# Patient Record
Sex: Male | Born: 1940 | Race: White | Hispanic: No | Marital: Married | Smoking: Former smoker
Health system: Southern US, Community
[De-identification: ages and names within clinical notes are randomized; demographics above are authoritative.]

## PROBLEM LIST (undated history)

## (undated) DIAGNOSIS — K649 Unspecified hemorrhoids: Secondary | ICD-10-CM

## (undated) DIAGNOSIS — K2270 Barrett's esophagus without dysplasia: Secondary | ICD-10-CM

## (undated) DIAGNOSIS — E785 Hyperlipidemia, unspecified: Secondary | ICD-10-CM

## (undated) DIAGNOSIS — Z85828 Personal history of other malignant neoplasm of skin: Secondary | ICD-10-CM

## (undated) DIAGNOSIS — K449 Diaphragmatic hernia without obstruction or gangrene: Secondary | ICD-10-CM

## (undated) DIAGNOSIS — F17201 Nicotine dependence, unspecified, in remission: Secondary | ICD-10-CM

## (undated) DIAGNOSIS — Z86010 Personal history of colonic polyps: Secondary | ICD-10-CM

## (undated) DIAGNOSIS — IMO0002 Reserved for concepts with insufficient information to code with codable children: Secondary | ICD-10-CM

## (undated) DIAGNOSIS — I10 Essential (primary) hypertension: Secondary | ICD-10-CM

## (undated) DIAGNOSIS — R001 Bradycardia, unspecified: Secondary | ICD-10-CM

## (undated) DIAGNOSIS — K219 Gastro-esophageal reflux disease without esophagitis: Secondary | ICD-10-CM

## (undated) DIAGNOSIS — M1990 Unspecified osteoarthritis, unspecified site: Secondary | ICD-10-CM

## (undated) DIAGNOSIS — E039 Hypothyroidism, unspecified: Secondary | ICD-10-CM

## (undated) DIAGNOSIS — N400 Enlarged prostate without lower urinary tract symptoms: Secondary | ICD-10-CM

## (undated) DIAGNOSIS — Z860101 Personal history of adenomatous and serrated colon polyps: Secondary | ICD-10-CM

## (undated) HISTORY — DX: Bradycardia, unspecified: R00.1

## (undated) HISTORY — DX: Nicotine dependence, unspecified, in remission: F17.201

## (undated) HISTORY — PX: ABDOMINAL HERNIA REPAIR: SHX539

## (undated) HISTORY — DX: Diaphragmatic hernia without obstruction or gangrene: K44.9

## (undated) HISTORY — DX: Barrett's esophagus without dysplasia: K22.70

## (undated) HISTORY — DX: Personal history of adenomatous and serrated colon polyps: Z86.0101

## (undated) HISTORY — DX: Hyperlipidemia, unspecified: E78.5

## (undated) HISTORY — DX: Personal history of colonic polyps: Z86.010

## (undated) HISTORY — DX: Gastro-esophageal reflux disease without esophagitis: K21.9

## (undated) HISTORY — DX: Essential (primary) hypertension: I10

## (undated) HISTORY — PX: HERNIA REPAIR: SHX51

## (undated) HISTORY — PX: CATARACT EXTRACTION W/ INTRAOCULAR LENS  IMPLANT, BILATERAL: SHX1307

## (undated) HISTORY — PX: SQUAMOUS CELL CARCINOMA EXCISION: SHX2433

## (undated) HISTORY — DX: Hypothyroidism, unspecified: E03.9

---

## 1967-07-07 HISTORY — PX: THYROIDECTOMY, PARTIAL: SHX18

## 1989-03-06 HISTORY — PX: ABDOMINAL HERNIA REPAIR: SHX539

## 1998-07-06 HISTORY — PX: UMBILICAL HERNIA REPAIR: SHX196

## 2002-07-06 HISTORY — PX: COLONOSCOPY: SHX174

## 2003-04-24 ENCOUNTER — Ambulatory Visit (HOSPITAL_COMMUNITY): Admission: RE | Admit: 2003-04-24 | Discharge: 2003-04-24 | Payer: Self-pay | Admitting: Internal Medicine

## 2005-12-04 HISTORY — PX: COLONOSCOPY: SHX174

## 2005-12-22 ENCOUNTER — Ambulatory Visit (HOSPITAL_COMMUNITY): Admission: RE | Admit: 2005-12-22 | Discharge: 2005-12-22 | Payer: Self-pay | Admitting: Internal Medicine

## 2005-12-22 ENCOUNTER — Ambulatory Visit: Payer: Self-pay | Admitting: Internal Medicine

## 2006-01-03 HISTORY — PX: ESOPHAGOGASTRODUODENOSCOPY: SHX1529

## 2006-01-19 ENCOUNTER — Ambulatory Visit: Payer: Self-pay | Admitting: Internal Medicine

## 2006-02-01 ENCOUNTER — Ambulatory Visit: Payer: Self-pay | Admitting: Internal Medicine

## 2006-02-01 ENCOUNTER — Encounter (INDEPENDENT_AMBULATORY_CARE_PROVIDER_SITE_OTHER): Payer: Self-pay | Admitting: *Deleted

## 2006-02-01 ENCOUNTER — Ambulatory Visit (HOSPITAL_COMMUNITY): Admission: RE | Admit: 2006-02-01 | Discharge: 2006-02-01 | Payer: Self-pay | Admitting: Internal Medicine

## 2006-05-03 ENCOUNTER — Ambulatory Visit: Payer: Self-pay | Admitting: Internal Medicine

## 2006-09-09 ENCOUNTER — Ambulatory Visit (HOSPITAL_COMMUNITY): Admission: RE | Admit: 2006-09-09 | Discharge: 2006-09-09 | Payer: Self-pay | Admitting: Pulmonary Disease

## 2007-04-25 ENCOUNTER — Ambulatory Visit: Payer: Self-pay | Admitting: Internal Medicine

## 2007-07-07 DIAGNOSIS — R001 Bradycardia, unspecified: Secondary | ICD-10-CM

## 2007-07-07 HISTORY — DX: Bradycardia, unspecified: R00.1

## 2007-09-29 ENCOUNTER — Ambulatory Visit (HOSPITAL_COMMUNITY): Admission: RE | Admit: 2007-09-29 | Discharge: 2007-09-29 | Payer: Self-pay | Admitting: Pulmonary Disease

## 2008-04-04 ENCOUNTER — Ambulatory Visit: Payer: Self-pay | Admitting: Internal Medicine

## 2008-05-08 ENCOUNTER — Ambulatory Visit: Payer: Self-pay | Admitting: Cardiology

## 2009-04-11 DIAGNOSIS — R634 Abnormal weight loss: Secondary | ICD-10-CM | POA: Insufficient documentation

## 2009-04-11 DIAGNOSIS — C4490 Unspecified malignant neoplasm of skin, unspecified: Secondary | ICD-10-CM | POA: Insufficient documentation

## 2009-04-11 DIAGNOSIS — K219 Gastro-esophageal reflux disease without esophagitis: Secondary | ICD-10-CM | POA: Insufficient documentation

## 2009-04-11 DIAGNOSIS — K319 Disease of stomach and duodenum, unspecified: Secondary | ICD-10-CM | POA: Insufficient documentation

## 2009-04-11 DIAGNOSIS — R1319 Other dysphagia: Secondary | ICD-10-CM | POA: Insufficient documentation

## 2009-04-12 ENCOUNTER — Ambulatory Visit: Payer: Self-pay | Admitting: Internal Medicine

## 2009-04-12 DIAGNOSIS — D126 Benign neoplasm of colon, unspecified: Secondary | ICD-10-CM | POA: Insufficient documentation

## 2010-08-05 NOTE — Assessment & Plan Note (Signed)
Summary: FU OV 1 YR,GERD,DYSPHAGIA/AMS   Visit Type:  Follow-up Visit Primary Care Provider:  Dr. Juanetta Gosling  Chief Complaint:  1 year follow up-doing ok.  History of Present Illness:      This is a 70 year old male who presents with GERD / heartburn follow-up.  The patient denies fever, anorexia, weight loss, abdominal pain, dysphagia, odynophagia, early satiety, hemetemesis, rectal bleeding, melena, and nocturnal symptoms.  Off Zegerid for 6 months, started having dysphagia with solids, resumed zegerid about 3 weeks ago, feels fine now.  Denies any heartburn or indigestion.  Current Medications (verified): 1)  Synthroid 150 Mcg Tabs (Levothyroxine Sodium) .... Once Daily 2)  Lisinopril-Hydrochlorothiazide 20-12.5 Mg Tabs (Lisinopril-Hydrochlorothiazide) .... Two Times A Day 3)  Amlodipine Besylate 10 Mg Tabs (Amlodipine Besylate) .... Once Daily 4)  Zegerid 40-1100 Mg Caps (Omeprazole-Sodium Bicarbonate) .... Once Daily  Allergies: No Known Drug Allergies  Review of Systems General:  Denies fever, chills, sweats, anorexia, fatigue, weakness, malaise, weight loss, and sleep disorder. CV:  Denies chest pains, angina, palpitations, syncope, dyspnea on exertion, orthopnea, PND, peripheral edema, and claudication. Resp:  Denies dyspnea at rest, dyspnea with exercise, cough, sputum, wheezing, coughing up blood, and pleurisy. GI:  Denies nausea, indigestion/heartburn, jaundice, gas/bloating, diarrhea, constipation, change in bowel habits, bloody BM's, black BMs, and fecal incontinence. Derm:  Denies rash, itching, dry skin, hives, moles, warts, and unhealing ulcers.  Vital Signs:  Patient profile:   70 year old male Height:      69 inches Weight:      240 pounds BMI:     35.57 Temp:     97.2 degrees F oral Pulse rate:   60 / minute BP sitting:   148 / 90  (left arm) Cuff size:   large  Vitals Entered By: Hendricks Limes LPN (April 12, 2009 9:09 AM)  Physical Exam  General:  obese.    Head:  Normocephalic and atraumatic. Eyes:  Sclera clear, no icterus. Mouth:  No deformity or lesions, dentition normal. Heart:  Regular rate and rhythm; no murmurs, rubs,  or bruits. Abdomen:  normal bowel sounds, obese, without guarding, without rebound, no hernia, no distesion, no tenderness, no masses, and no hepatomegally or splenomegaly.   Msk:  Symmetrical with no gross deformities. Normal posture. Pulses:  Normal pulses noted. Extremities:  trace pedal edema.   Neurologic:  Alert and  oriented x4;  grossly normal neurologically. Skin:  redness.   Cervical Nodes:  No significant cervical adenopathy. Psych:  Alert and cooperative. Normal mood and affect.  Impression & Recommendations:  Problem # 1:  GASTROESOPHAGEAL REFLUX DISEASE, CHRONIC (ICD-530.81) 70 y/o caucasian male with hx complicated GERD/peptic stricture and erosive reflux esophagitis on last EGD 2007.  Doing well on PPI.  Problem # 2:  Hx COLONIC POLYPS, ADENOMATOUS (ICD-211.3) Assessment: Comment Only  Other Orders: Est. Patient Level III ([address])  Patient Instructions: 1)  Call if difficulty swallowing returns 2)  Stay on your ZEGERID every day 3)  Office visit 12/2010 to setup colonoscopy/FU GERD 4)  FU your MD about your fluid in your legs 5)  The medication list was reviewed and reconciled.  All changed / newly prescribed medications were explained.  A complete medication list was provided to the patient / caregiver. Prescriptions: ZEGERID 40-1100 MG CAPS (OMEPRAZOLE-SODIUM BICARBONATE) once daily  #30 x 11   Entered and Authorized by:   Joselyn Arrow FNP-BC   Signed by:   Joselyn Arrow FNP-BC on 04/12/2009  Method used:   Electronically to        [facility] [address] (retail)       [address]       [place], [place]  [address]       Ph: [phone]       Fax: [phone]   RxID:   [id number]

## 2010-11-18 NOTE — Assessment & Plan Note (Signed)
NAMEMILFORD, CILENTO                 CHART#:  [id number]   DATE:  04/25/2007                       DOB:  08-22-1940   CHIEF COMPLAINT:  One-year followup GERD.   SUBJECTIVE:  Mr. Every is a 70 year old Caucasian male.  He has history  of gastroesophageal reflux disease and erosive reflux esophagitis and  peptic stricture.  He was dilated when he underwent EGD by Dr. Jena Gauss on  02/01/06.  His reflux symptoms had been well controlled on Aciphex 20 mg  daily.  However, about a month ago, he ran out.  He tells me he was  paying an extraordinary amount of money for his Aciphex as it was not  covered by his insurance.  He has been off of PPI for about a month.  He  did take a couple of his wife's Zegerid which seemed to help  significantly.  He denies any dysphagia or odynophagia.  Denies any  anorexia or early satiety.   CURRENT MEDICATIONS:  See the list from 04/25/07.   ALLERGIES:  NO KNOWN DRUG ALLERGIES.   OBJECTIVE:  VITAL SIGNS:  Weight 223 pounds.  Height 70 inches.  Temp  97.9.  Blood pressure 140/88.  Pulse 64.  GENERAL:  Mr. Franca is a well-developed, well-nourished, Caucasian male  in no acute distress.  HEENT:  Sclerae clear.  Nonicteric conjunctivae.  Oropharynx pink and  moist without any lesions.  CHEST:  Heart regular rate and rhythm.  Normal S1 and S2.  ABDOMEN:  Protuberant with positive bowel sounds x4.  No bruits  auscultated.  Soft, nontender, and nondistended without palpable mass,  hepatosplenomegaly.  No rebound tenderness or guarding.  EXTREMITIES:  Without clubbing or edema bilaterally.   ASSESSMENT:  Chronic GERD/erosive reflux esophagitis and peptic  stricture.  Patient doing well on PPI.  Unfortunately, he has been out  for 1 month.   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   PLAN:  1. Begin omeprazole 20 mg b.i.d., #60, with 0 refills.  If he does not      have  adequate symptom control, he is going to call me and we will      change him back to either Aciphex or Zegerid.  2. He was given Prilosec 20 mg samples to take home.  3. Colonoscopy in June of 2012.  4. Office visit in 1 year.       Lorenza Burton, N.P.  Electronically Signed     R. Roetta Sessions, M.D.  Electronically Signed    KJ/MEDQ  D:  04/25/2007  T:  04/26/2007  Job:  [id number]   cc:   Ramon Dredge L. Juanetta Gosling, M.D.

## 2010-11-18 NOTE — Letter (Signed)
May 08, 2008    Ramon Dredge L. Juanetta Gosling, MD  [address]  [place], [place] [address]   RE:  [name], [name]  MRN:  [id number]  /  DOB:  May 05, 1941   Dear Renae Fickle:   It was my pleasure evaluating, [name] in the office today in  consultation at your request for bradycardia.  As you know, this nice  gentleman is screened by our company on an annual basis with various  blood tests and ultrasound studies.  For the past 2 years, bradycardia  in the 40s has been noted.  His blood pressure control has been  suboptimal when tested by this service, but good in your office.  His  lipids are moderately abnormal.  A 5-year cardiovascular risk calculated  by this service was 14%.   [name] feels fine.  He is quite active, including mowing lawns as a  business, without symptoms.  He has had no lightheadedness and  certainly, no syncope.  He has no known cardiovascular disease.  He has  never previously seen a cardiologist nor undergone any significant  cardiac testing.   PAST MEDICAL HISTORY:  Notable for GERD.  He has had hypothyroidism and  is maintained on replacement therapy.  This occurred after resection of  the right lobe of his thyroid in the past.  He has also undergone repair  of an abdominal hernia.   ALLERGIES:  He has no known allergies.   CURRENT MEDICATIONS:  Include only:  1. Levothyroxine 0.15 mg daily.  2. Amlodipine 10 mg daily.  3. Lisinopril/hydrochlorothiazide 20/12.5 mg daily.   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  Married, with 2 adult children.   FAMILY HISTORY:  Positive for carcinoma of the lung in his father.  He  has had 3 siblings, one of whom died due to trauma.   REVIEW OF SYSTEMS:  Notable for need for corrective lenses, previous  cataract extraction from the left eye, occasional palpitations, stable  weight, and appetite on a regular  diet.  All other systems reviewed and  are negative.   PHYSICAL EXAMINATION:  GENERAL:  A healthy-appearing gentleman, in no  acute distress.  VITAL SIGNS:  The weight is 226, blood pressure 145/90, heart rate 62  and regular, and respirations 14.  HEENT:  Anicteric sclerae; bilateral arcus; EOMs full; and normal oral  mucosa.  SKIN:  Multiple areas of vitiligo; erythema over the posterior aspects  of both hands and in other sun-exposed areas; orange discoloration of  the palms - the patient denies significant consumption of carrots.  ENDOCRINE:  Enlargement of the left lobe of thyroid.  LUNGS:  Clear.  CARDIAC:  Normal first and second heart sounds; minimal systolic  ejection murmur; and normal PMI.  ABDOMEN:  Soft and nontender; no organomegaly; normal bowel sounds  without bruits.  EXTREMITIES:  Normal distal pulses; no edema.  NEUROLOGIC:  Symmetric strength and tone; normal cranial nerves.   EKG:  Normal sinus rhythm; slightly delayed R-wave progression;  borderline left atrial abnormality; otherwise normal.   LABORATORY DATA:  Laboratory from your office and the results of the  patient's screening tests were reviewed.  Mild atherosclerotic changes  were identified in his carotids without any focal stenosis.  He had  normal ABIs.  He had an LDL of 159, which was close at 130 in your  office.  He had systolic  blood pressure of 174, which has been lower in  your office and mine.  His chemistry profile and hepatic profile were  normal.  TSH was normal as was PSA.   IMPRESSION:  [name] is doing fairly well overall.  He has  asymptomatic sinus bradycardia, that has been present for at least the  past year and probably much longer.  No intervention is warranted at  this time.  If it becomes symptomatic, he may require implantation of a  pacemaker, but this is relatively and unlikely and certainly  unpredictable.  He should avoid medications, which might lower heart  rate,  particularly beta-blockers.   Blood pressure control is slightly suboptimal.  You may wish to increase  his dose of lisinopril/hydrochlorothiazide to determine if that has a  salutary effect.   His risk of coronary artery disease is intermediate.  By guidelines, he  is not absolutely a candidate for a statin drug, but I would recommend  treatment with one of the more inexpensive agent such as simvastatin.  I  will leave that decision to you and him.  Influenza vaccine was  administered at his request.  Please call me at anytime that I can  assist in the care of this nice gentleman.    Sincerely,      Gerrit Friends. Dietrich Pates, MD, [facility]  Electronically Signed    RMR/MedQ  DD: 05/09/2008  DT: 05/10/2008  Job #: [id number]

## 2010-11-18 NOTE — Assessment & Plan Note (Signed)
NAME:  [name], [name]                 CHART#:  [id number]   DATE:  04/04/2008                       DOB:  1940-07-11   FOLLOWUP:  Last seen on 04/25/2007, history of erosive reflux  esophagitis with stricture requiring dilation by me in 2007.  He was  doing very well on Aciphex 20 mg orally daily.  Insurance dictated he go  to omeprazole, which he has been taken just 20 mg twice daily.  He has  frequent breakthrough symptom of occasional dysphagia, but I do not feel  like his dysphagia is bad enough to warrant repeat esophageal dilation.  He has not had any other intercurrent medical illnesses.  He is followed  primarily by Dr. Juanetta Gosling.  He has gained a good 11 or 12 pounds since he  was last seen here a year ago.   CURRENT MEDICATIONS:  See updated list.   ALLERGIES:  No known drug allergies.   PHYSICAL EXAMINATION:  GENERAL:  Today, he appears well.  VITAL SIGNS:  Weight 234, height 5 feet 9 inches, temperature 97.5, BP  120/90, and pulse 56.  SKIN:  Warm and dry.  CHEST:  Lungs clear to auscultation.  CARDIAC:  Regular rate and rhythm without murmur, gallop, or rub.  ABDOMEN:  Nondistended, positive bowel sounds, soft, and nontender  without appreciable mass or organomegaly.   ASSESSMENT:  History of erosive reflux esophagitis, peptic stricture,  status post dilation previously.  He is having breakthrough symptoms on  omeprazole.  He is having intermittent esophageal dysphagia.   RECOMMENDATIONS:  1. Weight loss, antireflux lifestyle.  2. Samples of Aciphex 20 mg orally once daily x1 month given.      Prescription for Aciphex 20 mg once daily given.  His insurance      company may protest, but omeprazole does not work on this gentleman      and he would stay on this agent.  He also strive for 10-20 pound      weight loss in the next year.  We will see him back in 1 year.      Should he have any worsening of his symptoms, I told just to give      [place] a call.  We will set him up  for an EGD with esophageal dilation.      Not mentioned above, he has a history of tubulovillous adenoma      removed from      his rectum previously with a negative surveillance colonoscopy in      June 2007.  He is due for surveillance exam in 2012.       [name], M.D.  Electronically Signed     RMR/MEDQ  D:  04/04/2008  T:  04/04/2008  Job:  [id number]   cc:   Ramon Dredge L. Juanetta Gosling, M.D.

## 2010-11-21 NOTE — Op Note (Signed)
NAME:  [name], [name]                ACCOUNT NO.:  [id number]   MEDICAL RECORD NO.:  [id number]          PATIENT TYPE:  AMB   LOCATION:  DAY                           FACILITY:  [facility]   PHYSICIAN:  R. Roetta Sessions, M.D. DATE OF BIRTH:  1941/05/24   DATE OF PROCEDURE:  02/01/2006  DATE OF DISCHARGE:                                 OPERATIVE REPORT   PROCEDURE:  Esophagogastroduodenoscopy with Elease Hashimoto dilation, followed by  biopsy.   INDICATIONS FOR PROCEDURE:  The patient is a 70 year old gentleman with  longstanding gastroesophageal reflux disease.  He has some symptoms  manifesting as heartburn.  He has not been on acid suppression therapy  recently.  He has esophageal dysphagia.  EGD is now being done.  This  procedure has been discussed with the patient at length.  Potential risks,  benefits, and alternatives, have been reviewed, questions answered.  He is  agreeable.  Please see documentation in the medical record.   PROCEDURE NOTE:  O2 saturation, blood pressure, pulse and respirations were  monitored throughout the entire procedure.  Conscious sedation with Versed 4  mg IV, Demerol 75 mg IV in divided doses.   INSTRUMENT:  Olympus video chip system.   FINDINGS:  Examination of tubular esophagus revealed geographic erosions,  extending up one-third of the length to the distal esophagus.  There was no  Barrett's esophagus.  There are multiple 1, 2 to 3 mm pale lesions  throughout the esophagus, consistent with squamous papillomas.  There was a  soft, benign-appearing peptic stricture.  It appeared to be noncritical.  The EG junction was easily traversed with the scope and into the stomach.  The gastric cavity was empty.  It insufflated well with air.  Thorough  examination of the gastric mucosa and retroflexed at the proximal stomach,  esophagus, gastrics demonstrated only a small hiatal hernia and multiple  linear antral erosions.  No ulcer or infiltrative process was seen.   Pylorus  was patent and easily traversed.  Examination of the bulb and second portion  revealed no abnormalities.   THERAPEUTIC/DIAGNOSTIC MANEUVERS PERFORMED:  A #56 [place] Maloney dilator  was passed following insertion and would easily move back and  revealed no  apparent complications.  __________ was dilated.  One of the pale, raised  lesions was biopsied for histologic study.  The patient tolerated the  procedure well and was reactive.   ENDOSCOPY IMPRESSION:  Marked inflammatory changes, distal one-third of  esophageal mucosa, consistent with geographic erosive reflux esophagitis.  Soft, noncritical peptic stricture, status post dilation as described above.  We biopsied one of the pale (in all likelihood benign) lesions in the  esophagus.  Small hiatal hernia.  Multiple antral erosions.  Otherwise  normal gastric mucosa, patent pylorus, normal __________.   RECOMMENDATIONS:  1.  Begin AcipHex 20 mg orally twice daily.  He is to go by my office for      free samples.  We will go with twice daily therapy for a month and try      back to once daily thereafter.  2.  GERD  literature was provided to Mr. Teas.  3.  Follow up on pathology.  4.  Followup appointment with [place] in the office in three months.      [name], M.D.  Electronically Signed     RMR/MEDQ  D:  02/01/2006  T:  02/01/2006  Job:  [id number]   cc:   Ramon Dredge L. Juanetta Gosling, M.D.  Fax: [phone]

## 2010-11-21 NOTE — Op Note (Signed)
NAME:  [name], [name]                          ACCOUNT NO.:  [id number]   MEDICAL RECORD NO.:  [id number]                   PATIENT TYPE:  AMB   LOCATION:  DAY                                  FACILITY:  [facility]   PHYSICIAN:  R. Roetta Sessions, M.D.              DATE OF BIRTH:  1941-03-02   DATE OF PROCEDURE:  04/24/2003  DATE OF DISCHARGE:                                 OPERATIVE REPORT   PROCEDURE:  Colonoscopy with snare polypectomy.   INDICATIONS FOR PROCEDURE:  The patient is a 70 year old gentleman who comes  for colorectal cancer screening.  He was referred by Dr. Juanetta Gosling.  He is  devoid of any lower GI tract symptoms.  Back in the 1990s, he had a  colonoscopy in [place] which was reportedly negative.  There is no family  history of colorectal neoplasia.  Colonoscopy is now being done as a  screening maneuver.  This approach has been discussed with the patient.  The  potential risks, benefits, and alternatives have been reviewed and questions  answered.  He is agreeable.  Please see my handwritten H&P.   PROCEDURE:  O2 saturation, blood pressure, pulses, and respirations were  monitored throughout the entire procedure.  Conscious sedation was with  Versed 2 mg IV, Demerol 50 mg IV in divided doses.  The instrument used was  the Olympus video chip adult colonoscope.   FINDINGS:  Digital rectal examination revealed no abnormalities.   ENDOSCOPIC FINDINGS:  The prep was good.   Rectum:  Examination of the rectal mucosa including retroflex view of the  anal verge revealed a 1.25-cm sessile polyp straddling a fold 10 cm in from  the anal verge.  Please see photos.  The remainder of the rectal mucosa  appeared normal.   Colon:  The colonic mucosa was surveyed from the rectosigmoid junction  through the left, transverse, right colon to the area of the appendiceal  orifice, ileocecal valve, and cecum.  These structures were well-seen and  photographed for the record.  The  colonic mucosa all the way to the cecum  appeared normal.  From the level of the cecum and ileocecal valve, the scope  was slowly withdrawn.  All previously mentioned mucosal surfaces were again  seen, and no other abnormalities were observed.  The polyp at 10 cm was  removed completely in piecemeal fashion.  All fragments were recovered.  The  patient tolerated the procedure very well and was reactive in endoscopy.   IMPRESSION:  Sessile rectal polyp, as described above; removed, as described  above.  Otherwise, normal rectum, normal colon.    RECOMMENDATIONS:  1. No aspirin or arthritis medications for 10 days.  2. Follow up on pathology.  3. Further recommendations to follow.      ___________________________________________  [name], M.D.   RMR/MEDQ  D:  04/24/2003  T:  04/24/2003  Job:  [id number]   cc:   Ramon Dredge L. Juanetta Gosling, M.D.  [address]  [place]  [place] [address]  Fax: [phone]

## 2010-11-21 NOTE — H&P (Signed)
NAMECHRISTION, [name]                ACCOUNT NO.:  [id number]   MEDICAL RECORD NO.:  [id number]           PATIENT TYPE:   LOCATION:                                FACILITY:  [facility]   PHYSICIAN:  R. Roetta Sessions, M.D. DATE OF BIRTH:  1940/08/07   DATE OF ADMISSION:  DATE OF DISCHARGE:  LH                                HISTORY & PHYSICAL   REASON:  EGD.   HPI:  [name] is a 70 year old Caucasian male who presents today for  evaluation of chronic GERD symptoms.  He states he has almost daily  heartburn that is generally worse with certain foods, especially fried  foods.  He has a history of this for at least 3 years that he can recall.  He has also more recently noted solid food dysphagia.  He feels like his  food gets stuck and points to his mid esophagus.  He can clear the food  with a bolus of water.  He denies any problems with liquids.  He denies any  odynophagia.  He had been on Prevacid 30 mg daily for several years and more  recently his insurance company said they were not going to pay for this so  he has been taking p.r.n. over-the-counter Tums.  He denies any nausea,  vomiting, anorexia or early satiety.  He denies any rectal bleeding or  melena.   CURRENT MEDICATIONS:  1.  Diovan 320 mg daily.  2.  DynaCirc CK 10 mg daily.  3.  Synthroid 175 mcg daily.  4.  Tums p.r.n.  5.  Motrin 200 mg rarely.   ALLERGIES:  NO KNOWN DRUG ALLERGIES.   PAST MEDICAL HISTORY:  Significant for:  1.  Squamous cell skin carcinoma.  2.  Neuropathy in 1994.  3.  Nodule removed from his thyroid in 1969, he has been on Synthroid since.  4.  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  He had a follow up colonoscopy, by      Dr. Jena Gauss December 22, 2005, which was normal.   FAMILY HISTORY:  Positive for a brother with colonic polyps.  Mother alive  and healthy at age 58, she has hypertension.  Father deceased at age 7 with  history  of lung carcinoma, he was a smoker.  He has two healthy siblings,  one deceased secondary to MVA.   SOCIAL HISTORY:  [name] has been in his second marriage for the last 12  years.  He has two grown healthy children.  He is a retired Naval architect.  He does landscaping on the side.  He has a 20 pack year history of tobacco  use, quitting in 1981.  He drinks a couple of beers a week.  Denies any drug  use.   REVIEW OF SYSTEMS:  CONSTITUTIONAL:  See HPI.  Denies any fever or chills.  Denies any fatigue.  CARDIOVASCULAR:  Denies any chest pain or palpitations.  PULMONARY:  Denies any shortness of breath, dyspnea, cough, hemoptysis.  GI:  See HPI.  Denies any diarrhea, constipation.   PHYSICAL EXAM:  VITAL SIGNS:  Is 223.5 pounds, height 69 inches, temperature  97.6, blood pressure 138/90 and pulse 56.  GENERAL:  [name] is a 70 year old Caucasian male who is alert, oriented,  pleasant, cooperative, in no acute distress.  He is well-developed, well-  nourished.  HEENT:  Sclera clear, nonicteric, conjunctiva pink.  Oropharynx pink and  moist without any lesions.  NECK:  Supple without any mass or thyromegaly.  CHEST:  Heart regular rate and rhythm with normal S1, S2, without murmurs,  clicks, rubs or gallops.  LUNGS:  Clear to auscultation bilaterally.  ABDOMEN:  Protuberant with positive bowel sounds x4.  No bruits auscultated.  Soft, nontender, nondistended without palpable mass or hepatosplenomegaly.  No tenderness or guarding.  EXTREMITIES:  With 1+ lower extremity edema bilaterally.   IMPRESSION:  [name] is a 70 year old Caucasian male with a history of  chronic gastroesophageal reflux disease, mostly daily heartburn.  He has  been off proton pump inhibitor due to insurance reasons.  He is also  complaining of solid food dysphagia.  He is going to need further evaluation  with esophagogastroduodenoscopy and possible esophageal dilatation if he has  developed esophageal  web, ring or stricture and to rule out Barrett's  esophagus.   PLAN:  1.  Rx is given for omeprazole 20 mg daily, #30 with 5 refills.  2.  GERD literature is given for his review.  3.  He is scheduled for EGD with ED with Dr. Jena Gauss in the near future.  I      have discussed this procedure, including risks and benefits, which      include, but are not limited to, bleeding, infection, perforation, drug      reaction.  He agrees with plan and consent will be obtained.      Nicholas Lose, N.P.      [name], M.D.  Electronically Signed    KC/MEDQ  D:  01/19/2006  T:  01/19/2006  Job:  [id number]   cc:   Ramon Dredge L. Juanetta Gosling, M.D.  Fax: [phone]

## 2010-11-21 NOTE — Op Note (Signed)
NAME:  [name], [name]                ACCOUNT NO.:  [id number]   MEDICAL RECORD NO.:  [id number]          PATIENT TYPE:  AMB   LOCATION:  DAY                           FACILITY:  [facility]   PHYSICIAN:  R. Roetta Sessions, M.D. DATE OF BIRTH:  1941/04/23   DATE OF PROCEDURE:  12/22/2005  DATE OF DISCHARGE:                                 OPERATIVE REPORT   PROCEDURE:  Surveillance colonoscopy.   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  It was removed by me.  He has  not really had any GI symptoms, although he thinks he might have passed a  small amount of blood per rectum 1 month ago.  He is here for surveillance.  This approach has been discussed with the patient at length, the potential  risks, benefits, and alternatives have been reviewed, questions answered.  He is agreeable.  Please see the documentation in the medical record.   PROCEDURE NOTE:  O2 saturation, blood pressure, pulse, and respiration were  monitored throughout the entire procedure.   CONSCIOUS SEDATION:  Versed 3 mg IV, Demerol 75 mg IV in divided doses.   INSTRUMENT USED:  Olympus video chip system.   FINDINGS:  Digital rectal exam revealed no abnormalities.  Endoscopic  findings:  Prep was adequate.   Rectum:  Examination of the rectal mucosa including a retroflexed view of  the anal verge revealed no abnormalities.   Colon:  The colonic mucosa was surveyed from the rectosigmoid junction  through the left, transverse and right colon to the area of the appendiceal  orifice, ileocecal valve and cecum.  These structures were well-seen and  photographed for the record.  From this level the scope was slowly and  cautiously withdrawn.  All previously-mentioned mucosal surfaces were again  seen.  The colonic mucosa appeared normal.  The patient tolerated the  procedure well, was reacted in endoscopy.   IMPRESSION:  1.  Normal rectum.  2.   Normal colon.   RECOMMENDATIONS:  Repeat colonoscopy 5 years.      [name], M.D.  Electronically Signed     RMR/MEDQ  D:  12/22/2005  T:  12/22/2005  Job:  [id number]   cc:   Ramon Dredge L. Juanetta Gosling, M.D.  Fax: [phone]

## 2010-12-17 ENCOUNTER — Encounter: Payer: Self-pay | Admitting: Gastroenterology

## 2010-12-17 ENCOUNTER — Ambulatory Visit (INDEPENDENT_AMBULATORY_CARE_PROVIDER_SITE_OTHER): Payer: Medicare Other | Admitting: Gastroenterology

## 2010-12-17 DIAGNOSIS — R1314 Dysphagia, pharyngoesophageal phase: Secondary | ICD-10-CM

## 2010-12-17 DIAGNOSIS — K219 Gastro-esophageal reflux disease without esophagitis: Secondary | ICD-10-CM

## 2010-12-17 DIAGNOSIS — R1319 Other dysphagia: Secondary | ICD-10-CM | POA: Insufficient documentation

## 2010-12-17 DIAGNOSIS — D126 Benign neoplasm of colon, unspecified: Secondary | ICD-10-CM

## 2010-12-17 DIAGNOSIS — R1310 Dysphagia, unspecified: Secondary | ICD-10-CM

## 2010-12-17 NOTE — Assessment & Plan Note (Signed)
Due for surveillance colonoscopy given history of tubulovillous adenoma in the past. I have discussed the risks, alternatives, benefits with regards to but not limited to the risk of reaction to medication, bleeding, infection, perforation and the patient is agreeable to proceed. Written consent to be obtained.

## 2010-12-17 NOTE — Progress Notes (Signed)
Primary Care Physician:  Fredirick Maudlin, MD  Primary Gastroenterologist:  Roetta Sessions, MD  Chief Complaint  Patient presents with  . Dysphagia    HPI:  [name] is a 70 y.o. male here for further evaluation of dysphagia to solid foods. He is also due for surveillance colonoscopy given history of tubulovillous adenoma of the rectum previously. Solid food dysphagia over the past few months. No heartburn on Prilosec. BM regular, no melena, no brbpr. No abdominal pain. No weight loss.    Current Outpatient Prescriptions  Medication Sig Dispense Refill  . allopurinol (ZYLOPRIM) 300 MG tablet Take 300 mg by mouth daily.        Marland Kitchen amLODipine (NORVASC) 10 MG tablet Take 10 mg by mouth daily.        Marland Kitchen atorvastatin (LIPITOR) 20 MG tablet Take 20 mg by mouth daily.        Marland Kitchen levothyroxine (SYNTHROID, LEVOTHROID) 150 MCG tablet Take 125 mcg by mouth daily.       Marland Kitchen lisinopril-hydrochlorothiazide (PRINZIDE,ZESTORETIC) 20-12.5 MG per tablet Take 1 tablet by mouth daily.        Marland Kitchen omeprazole (PRILOSEC OTC) 20 MG tablet Take 20 mg by mouth daily.        . pravastatin (PRAVACHOL) 40 MG tablet Take 40 mg by mouth daily.        Marland Kitchen omeprazole-sodium bicarbonate (ZEGERID) 40-1100 MG per capsule Take 1 capsule by mouth daily before breakfast.          Allergies as of 12/17/2010  . (No Known Allergies)    Past Medical History  Diagnosis Date  . Neuropathy 1994  . HTN (hypertension)   . Hyperlipidemia   . Gout   . Hypothyroidism   . GERD (gastroesophageal reflux disease)   . Hx of adenomatous colonic polyps     Past Surgical History  Procedure Date  . Thyroid nodule 1969    removed  . Hernia repair   . Esophagogastroduodenoscopy 01/2006    geographic erosive RE, noncritical peptic stricture s/p dilation, antral erosion  . Colonoscopy 12/2005    normal  . Colonoscopy 2004    tubulovillous adenoma of rectum     Family History  Problem Relation Age of Onset  . Colon cancer Neg Hx   .  Liver disease Neg Hx   . Lung cancer Father 24  . Colon polyps Brother     History   Social History  . Marital Status: Married    Spouse Name: N/A    Number of Children: N/A  . Years of Education: N/A   Occupational History  . landscaping    Social History Main Topics  . Smoking status: Former Smoker    Types: Cigarettes  . Smokeless tobacco: Former Neurosurgeon    Quit date: 07/19/1979  . Alcohol Use: Yes     a beer every now again  . Drug Use: No  . Sexually Active: Not on file   Other Topics Concern  . Not on file   Social History Narrative  . No narrative on file      ROS:  General: Negative for anorexia, weight loss, fever, chills, fatigue, weakness. Eyes: Negative for vision changes.  ENT: Negative for hoarseness, difficulty swallowing , nasal congestion. CV: Negative for chest pain, angina, palpitations, dyspnea on exertion, peripheral edema.  Respiratory: Negative for dyspnea at rest, dyspnea on exertion, cough, sputum, wheezing.  GI: See history of present illness. GU:  Negative for dysuria, hematuria, urinary incontinence, urinary frequency,  nocturnal urination.  MS: Negative for joint pain, low back pain.  Derm: Negative for rash or itching.  Neuro: Negative for weakness, abnormal sensation, seizure, frequent headaches, memory loss, confusion.  Psych: Negative for anxiety, depression, suicidal ideation, hallucinations.  Endo: Negative for unusual weight change.  Heme: Negative for bruising or bleeding. Allergy: Negative for rash or hives.    Physical Examination:  BP 158/84  Pulse 63  Temp(Src) 97.7 F (36.5 C) (Temporal)  Ht 5\' 9"  (1.753 m)  Wt 241 lb 9.6 oz (109.589 kg)  BMI 35.68 kg/m2   General: Well-nourished, well-developed in no acute distress.  Head: Normocephalic, atraumatic.   Eyes: Conjunctiva pink, no icterus. Mouth: Oropharyngeal mucosa moist and pink , no lesions erythema or exudate. Neck: Supple without thyromegaly, masses, or  lymphadenopathy.  Lungs: Clear to auscultation bilaterally.  Heart: Regular rate and rhythm, no murmurs rubs or gallops.  Abdomen: Bowel sounds are normal, nontender, nondistended, no hepatosplenomegaly or masses, no abdominal bruits or    hernia , no rebound or guarding.   Extremities: No lower extremity edema.  Neuro: Alert and oriented x 4 , grossly normal neurologically.  Skin: Warm and dry, no rash or jaundice.   Psych: Alert and cooperative, normal mood and affect.

## 2010-12-17 NOTE — Assessment & Plan Note (Signed)
Continue Prilosec OTC 20 mg daily for now. Based on EGD findings we may need to alter therapy.

## 2010-12-17 NOTE — Assessment & Plan Note (Signed)
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 Denies typical heartburn symptoms on Prilosec. EGD with dilation in the near future. I have discussed the risks, alternatives, benefits with regards to but not limited to the risk of reaction to medication, bleeding, infection, perforation and the patient is agreeable to proceed. Written consent to be obtained.

## 2010-12-18 NOTE — Progress Notes (Signed)
Cc to PCP 

## 2011-01-09 MED ORDER — SODIUM CHLORIDE 0.45 % IV SOLN
Freq: Once | INTRAVENOUS | Status: DC
  Filled 2011-01-09: qty 1000

## 2011-01-12 ENCOUNTER — Other Ambulatory Visit: Payer: Self-pay | Admitting: Internal Medicine

## 2011-01-12 ENCOUNTER — Encounter (HOSPITAL_COMMUNITY)
Admission: RE | Disposition: A | Discharge status: Home / Self Care | Payer: Self-pay | Source: Ambulatory Visit | Attending: Internal Medicine

## 2011-01-12 ENCOUNTER — Ambulatory Visit (HOSPITAL_COMMUNITY)
Admission: RE | Admit: 2011-01-12 | Discharge: 2011-01-12 | Disposition: A | Discharge status: Home / Self Care | Payer: Medicare Other | Source: Ambulatory Visit | Attending: Internal Medicine | Admitting: Internal Medicine

## 2011-01-12 ENCOUNTER — Encounter (HOSPITAL_COMMUNITY): Payer: Self-pay

## 2011-01-12 ENCOUNTER — Encounter (INDEPENDENT_AMBULATORY_CARE_PROVIDER_SITE_OTHER): Payer: Medicare Other | Admitting: Internal Medicine

## 2011-01-12 DIAGNOSIS — Z1211 Encounter for screening for malignant neoplasm of colon: Secondary | ICD-10-CM

## 2011-01-12 DIAGNOSIS — Z860100 Personal history of colon polyps, unspecified: Secondary | ICD-10-CM | POA: Insufficient documentation

## 2011-01-12 DIAGNOSIS — R634 Abnormal weight loss: Secondary | ICD-10-CM | POA: Insufficient documentation

## 2011-01-12 DIAGNOSIS — R1314 Dysphagia, pharyngoesophageal phase: Secondary | ICD-10-CM | POA: Insufficient documentation

## 2011-01-12 DIAGNOSIS — K2100 Gastro-esophageal reflux disease with esophagitis, without bleeding: Secondary | ICD-10-CM

## 2011-01-12 DIAGNOSIS — I10 Essential (primary) hypertension: Secondary | ICD-10-CM | POA: Insufficient documentation

## 2011-01-12 DIAGNOSIS — R1310 Dysphagia, unspecified: Secondary | ICD-10-CM

## 2011-01-12 DIAGNOSIS — Z79899 Other long term (current) drug therapy: Secondary | ICD-10-CM | POA: Insufficient documentation

## 2011-01-12 DIAGNOSIS — K2270 Barrett's esophagus without dysplasia: Secondary | ICD-10-CM | POA: Insufficient documentation

## 2011-01-12 HISTORY — PX: COLONOSCOPY: SHX5424

## 2011-01-12 HISTORY — PX: MALONEY DILATION: SHX5535

## 2011-01-12 HISTORY — PX: ESOPHAGOGASTRODUODENOSCOPY: SHX5428

## 2011-01-12 SURGERY — EGD (ESOPHAGOGASTRODUODENOSCOPY)
Anesthesia: Moderate Sedation | Site: Esophagus

## 2011-01-12 MED ORDER — OMEPRAZOLE MAGNESIUM 20 MG PO TBEC: 20.0000 mg | DELAYED_RELEASE_TABLET | Freq: Two times a day (BID) | ORAL | Status: DC

## 2011-01-12 MED ORDER — MIDAZOLAM HCL 5 MG/5ML IJ SOLN
INTRAMUSCULAR | Status: AC
  Filled 2011-01-12: qty 10

## 2011-01-12 MED ORDER — MIDAZOLAM HCL 5 MG/5ML IJ SOLN
INTRAMUSCULAR | Status: DC | PRN
  Administered 2011-01-12 (×2): 2 mg via INTRAVENOUS

## 2011-01-12 MED ORDER — BUTAMBEN-TETRACAINE-BENZOCAINE 2-2-14 % EX AERO
INHALATION_SPRAY | CUTANEOUS | Status: DC | PRN
  Administered 2011-01-12: 2 via TOPICAL

## 2011-01-12 MED ORDER — MEPERIDINE HCL 25 MG/ML IJ SOLN
INTRAMUSCULAR | Status: DC | PRN
  Administered 2011-01-12 (×2): 50 mg via INTRAVENOUS

## 2011-01-12 MED ORDER — MEPERIDINE HCL 100 MG/ML IJ SOLN
INTRAMUSCULAR | Status: AC
  Filled 2011-01-12: qty 2

## 2011-01-12 NOTE — Op Note (Signed)
NAMEHURLEY, [name]                ACCOUNT NO.:  [id number]  MEDICAL RECORD NO.:  [id number]  LOCATION:  [facility]                          FACILITY:  [facility]  PHYSICIAN:  R. Roetta Sessions, MD FACP FACGDATE OF BIRTH:  05-30-1941  DATE OF PROCEDURE:  01/12/2011 DATE OF DISCHARGE:  01/12/2011                              OPERATIVE REPORT   INDICATIONS FOR PROCEDURE:  A 70 year old gentleman with recurrent esophageal dysphagia, background longstanding GERD, GERD symptoms well- controlled clinically on omeprazole 20 mg orally daily, history of tubulovillous adenoma and colonoscopy in 2004, negative exam 2007 here for surveillance colonoscopy.  Risks, benefits, limitations, alternatives and imponderables have been discussed, questions answered, all parties agreeable.  PROCEDURE NOTE:  O2 saturation, blood pressure, pulse and respirations were monitored throughout the entire procedure.  CONSCIOUS SEDATION: 1. Versed 4 mg IV. 2. Demerol 100 mg IV in divided doses.  INSTRUMENT:  Pentax video chip system.  Cetacaine spray for topical pharyngeal anesthesia.  FINDINGS:  EGD examination of the tubular esophagus revealed multiple geographic erosions extending up 10-cm in the tubular esophagus.  There was also 2 pale nodules mid esophagus, 2 short tongues of salmon-colored erythema coming out just above the GE junction within 5-mm.  Please see photos.  Tubular esophagus patent through the EGD junction.  Stomach: Gastric cavity was empty and insufflated well with air.  Thorough examination of the gastric mucosa including retroflexed view of the proximal stomach, esophagogastric junction demonstrated only a small hiatal hernia.  Pylorus was patent, easily traversed.  Examination of the bulb second portion revealed no abnormalities.  THERAPEUTIC/DIAGNOSTIC MANEUVERS PERFORMED:  Scope was removed.  A 56- French Maloney dilator was passed to full insertion with ease look back revealed no apparent  complication with the passage of the dilator. Subsequent biopsies of the salmon-colored epithelium straddling to GE junction were taken for histologic study and subsequently the nodules in the mid esophagus were biopsied.  The patient tolerated the procedure well and was prepared for colonoscopy.  Digital rectal exam revealed no abnormalities.  Endoscopic findings:  Prep was adequate.  Colon: Colonic mucosa was surveyed from the rectosigmoid junction through the left transverse right colon to the appendiceal orifice, ileocecal valve/cecum.  These structures were well seen and photographed for the record.  From this level, the scope was slowly and cautiously withdrawn. All previously mentioned mucosal surfaces were again seen.  The colon appeared normal.  Scope was pulled down into the rectum, where a thorough examination of the rectal mucosa including retroflexed view of the anal verge demonstrated no abnormalities.  The patient tolerated this procedure well.  Cecal withdrawal time 7 minutes.  IMPRESSION: 1. Esophagogastroduodenoscopy with significant distal esophageal     erosions consistent with rather severe erosive reflux esophagitis,     2 tongues of salmon-colored epithelium.  Distal esophagus, Barrett     esophagus, status post biopsy. 2. Two mid esophageal biopsy of nodule, status post biopsy. 3. Hiatal hernia, otherwise normal stomach D1 and D2.  Colonoscopy     findings, normal rectum.  RECOMMENDATIONS: 1. Repeat colonoscopy in 5 years. 2. GERD.  Hiatal hernia literature provided to [name]. 3. Increased omeprazole 20 mg orally twice  daily. 4. Followup on path. 5. Further recommendations to follow.     [name], MD FACP [facility]     RMR/MEDQ  D:  01/12/2011  T:  01/12/2011  Job:  [id number]  cc:   [name], M.D. Fax: [phone]

## 2011-01-12 NOTE — Brief Op Note (Signed)
01/12/2011  1:28 PM  PATIENT:  [name]  70 y.o. male  PRE-OPERATIVE DIAGNOSIS:  dysphagia, gerd, h/o colon polyps  POST-OPERATIVE DIAGNOSIS:  Reflux Esophagitis Barretts Esophageal Ring    PROCEDURE:  Procedure(s): ESOPHAGOGASTRODUODENOSCOPY (EGD) MALONEY DILATION COLONOSCOPY  SURGEON:  Surgeon(s): Corbin Ade, MD  EGD- no stricture ; 66 F maloney passed; esophageal nodule - biopsied; question of short segment barrett's biopsied; distal esoph erosions involving 1/3 of distal esophagus And HH ; o/w negative     ZOX:WRUEAV 7 minutes

## 2011-01-17 ENCOUNTER — Encounter: Payer: Self-pay | Admitting: Internal Medicine

## 2011-01-21 NOTE — Progress Notes (Signed)
ERROR

## 2011-01-22 ENCOUNTER — Encounter (HOSPITAL_COMMUNITY): Payer: Self-pay | Admitting: Internal Medicine

## 2012-01-12 ENCOUNTER — Encounter: Payer: Self-pay | Admitting: Internal Medicine

## 2012-01-12 ENCOUNTER — Ambulatory Visit (INDEPENDENT_AMBULATORY_CARE_PROVIDER_SITE_OTHER): Payer: Medicare Other | Admitting: Internal Medicine

## 2012-01-12 VITALS — BP 140/80 | HR 89 | Temp 98.0°F | Ht 69.0 in | Wt 247.8 lb

## 2012-01-12 DIAGNOSIS — K219 Gastro-esophageal reflux disease without esophagitis: Secondary | ICD-10-CM

## 2012-01-12 DIAGNOSIS — K2270 Barrett's esophagus without dysplasia: Secondary | ICD-10-CM

## 2012-01-12 NOTE — Progress Notes (Signed)
Primary Care Physician:  Fredirick Maudlin, MD Primary Gastroenterologist:  Dr.   Pre-Procedure History & Physical: HPI:  [name] is a 71 y.o. male here for followup short segment Barrett's esophagus. Diagnosed with Barrett's esophagus last year. No dysplasia. Several soft tissue nodules came back benign the biopsies. Reflux symptoms well controlled on Prilosec 20 mg daily. He over eats or eats unusual food he may have reflux. No dysphagia status post dilation of the patent tubular esophagus last year. He has gained 7 pounds since his last office visit. He is significantly over his ideal body weight. History of tubovillous adenoma and positive family history of colon polyps. He is due for surveillance colonoscopy 4 years from now. Otherwise, no significant intercurrent illness or hospitalization.  Past Medical History  Diagnosis Date  . Neuropathy 1994  . HTN (hypertension)   . Hyperlipidemia   . Gout   . Hypothyroidism   . GERD (gastroesophageal reflux disease)   . Hx of adenomatous colonic polyps   . Barrett's esophagus   . Hiatal hernia     Past Surgical History  Procedure Date  . Thyroid nodule 1969    removed  . Hernia repair   . Esophagogastroduodenoscopy 01/2006    geographic erosive RE, noncritical peptic stricture s/p dilation, antral erosion  . Colonoscopy 12/2005    normal  . Colonoscopy 2004    tubulovillous adenoma of rectum   . Esophagogastroduodenoscopy 01/12/2011    Dr. Jena Gauss- hiatal hernia, distal esophageal erosions, barretts esophagus  . Maloney dilation 01/12/2011    Procedure: Elease Hashimoto DILATION;  Surgeon: Corbin Ade, MD;  Location: [facility];  Service: Endoscopy;  Laterality: N/A;  56 french  . Colonoscopy 01/12/2011    Dr. Jena Gauss- normal rectum    Prior to Admission medications   Medication Sig Start Date End Date Taking? Authorizing Provider  amLODipine (NORVASC) 10 MG tablet Take 10 mg by mouth daily.     Yes Historical Provider, MD  atorvastatin  (LIPITOR) 20 MG tablet Take 20 mg by mouth daily.     Yes Historical Provider, MD  indomethacin (INDOCIN) 50 MG capsule Take 50 mg by mouth 2 (two) times daily with a meal.  01/06/12  Yes Historical Provider, MD  levothyroxine (SYNTHROID, LEVOTHROID) 150 MCG tablet Take 125 mcg by mouth daily.    Yes Historical Provider, MD  lisinopril-hydrochlorothiazide (PRINZIDE,ZESTORETIC) 20-12.5 MG per tablet Take 1 tablet by mouth daily.     Yes Historical Provider, MD  omeprazole (PRILOSEC OTC) 20 MG tablet Take 1 tablet (20 mg total) by mouth 2 (two) times daily. 01/12/11  Yes Corbin Ade, MD  pravastatin (PRAVACHOL) 40 MG tablet Take 40 mg by mouth daily.     Yes Historical Provider, MD    Allergies as of 01/12/2012  . (No Known Allergies)    Family History  Problem Relation Age of Onset  . Colon cancer Neg Hx   . Liver disease Neg Hx   . Lung cancer Father 37  . Colon polyps Brother     History   Social History  . Marital Status: Married    Spouse Name: N/A    Number of Children: N/A  . Years of Education: N/A   Occupational History  . landscaping    Social History Main Topics  . Smoking status: Former Smoker -- 1.0 packs/day    Types: Cigarettes  . Smokeless tobacco: Former Neurosurgeon    Quit date: 07/19/1979  . Alcohol Use: Yes  a beer every now again  . Drug Use: No  . Sexually Active: Not on file   Other Topics Concern  . Not on file   Social History Narrative  . No narrative on file    Review of Systems: See HPI, otherwise negative ROS  Physical Exam: BP 140/80  Pulse 89  Temp 98 F (36.7 C) (Temporal)  Ht 5\' 9"  (1.753 m)  Wt 247 lb 12.8 oz (112.401 kg)  BMI 36.59 kg/m2 General:   Alert,  Well-developed, well-nourished, pleasant and cooperative in NAD Skin:  Intact without significant lesions or rashes. Eyes:  Sclera clear, no icterus.   Conjunctiva pink. Ears:  Normal auditory acuity. Nose:  No deformity, discharge,  or lesions. Mouth:  No deformity or  lesions. Neck:  Supple; no masses or thyromegaly. No significant cervical adenopathy. Lungs:  Clear throughout to auscultation.   No wheezes, crackles, or rhonchi. No acute distress. Heart:  Regular rate and rhythm; no murmurs, clicks, rubs,  or gallops. Abdomen: Obese. Positive bowel syndrome  Soft and nontender without appreciable mass or hepatosplenomegaly.  Pulses:  Normal pulses noted. Extremities:  Without clubbing or edema.  Impression/Plan: Very pleasant 71 year old gentleman with short segment Barrett's esophagus.Marland Kitchen He is due for one-year surveillance EGD. Discussed the multipronged broach and reflux with this patient. He needs to lose 15-20 pounds in the next 6 months to a year. This would be very helpful.  The risks, benefits, limitations, alternatives and imponderables have been reviewed with the patient. Potential for esophageal dilation, biopsy, etc. have also been reviewed.  Questions have been answered. All parties agreeable.

## 2012-01-12 NOTE — Patient Instructions (Addendum)
Loose 15 pounds over the next year    Will schedule an EGD to check your Barretts esophagus   Repeat Colonoscopy in 4 years to check for polyps

## 2012-01-13 ENCOUNTER — Encounter (HOSPITAL_COMMUNITY): Payer: Self-pay | Admitting: Pharmacy Technician

## 2012-01-26 MED ORDER — SODIUM CHLORIDE 0.45 % IV SOLN
Freq: Once | INTRAVENOUS | Status: AC
  Administered 2012-01-27: 1000 mL via INTRAVENOUS

## 2012-01-27 ENCOUNTER — Encounter (HOSPITAL_COMMUNITY): Payer: Self-pay | Admitting: *Deleted

## 2012-01-27 ENCOUNTER — Encounter (HOSPITAL_COMMUNITY)
Admission: RE | Disposition: A | Discharge status: Home / Self Care | Payer: Self-pay | Source: Ambulatory Visit | Attending: Internal Medicine

## 2012-01-27 ENCOUNTER — Ambulatory Visit (HOSPITAL_COMMUNITY)
Admission: RE | Admit: 2012-01-27 | Discharge: 2012-01-27 | Disposition: A | Discharge status: Home / Self Care | Payer: Medicare Other | Source: Ambulatory Visit | Attending: Internal Medicine | Admitting: Internal Medicine

## 2012-01-27 DIAGNOSIS — K2100 Gastro-esophageal reflux disease with esophagitis, without bleeding: Secondary | ICD-10-CM

## 2012-01-27 DIAGNOSIS — K2270 Barrett's esophagus without dysplasia: Secondary | ICD-10-CM

## 2012-01-27 DIAGNOSIS — Z09 Encounter for follow-up examination after completed treatment for conditions other than malignant neoplasm: Secondary | ICD-10-CM | POA: Insufficient documentation

## 2012-01-27 DIAGNOSIS — K219 Gastro-esophageal reflux disease without esophagitis: Secondary | ICD-10-CM

## 2012-01-27 DIAGNOSIS — I10 Essential (primary) hypertension: Secondary | ICD-10-CM | POA: Insufficient documentation

## 2012-01-27 DIAGNOSIS — E785 Hyperlipidemia, unspecified: Secondary | ICD-10-CM | POA: Insufficient documentation

## 2012-01-27 HISTORY — PX: ESOPHAGOGASTRODUODENOSCOPY: SHX5428

## 2012-01-27 SURGERY — EGD (ESOPHAGOGASTRODUODENOSCOPY)
Anesthesia: Moderate Sedation

## 2012-01-27 MED ORDER — BUTAMBEN-TETRACAINE-BENZOCAINE 2-2-14 % EX AERO
INHALATION_SPRAY | CUTANEOUS | Status: DC | PRN
  Administered 2012-01-27: 2 via TOPICAL

## 2012-01-27 MED ORDER — MIDAZOLAM HCL 5 MG/5ML IJ SOLN
INTRAMUSCULAR | Status: AC
  Filled 2012-01-27: qty 10

## 2012-01-27 MED ORDER — MEPERIDINE HCL 100 MG/ML IJ SOLN
INTRAMUSCULAR | Status: AC
  Filled 2012-01-27: qty 1

## 2012-01-27 MED ORDER — MIDAZOLAM HCL 5 MG/5ML IJ SOLN
INTRAMUSCULAR | Status: DC | PRN
  Administered 2012-01-27: 2 mg via INTRAVENOUS

## 2012-01-27 MED ORDER — MEPERIDINE HCL 100 MG/ML IJ SOLN
INTRAMUSCULAR | Status: DC | PRN
  Administered 2012-01-27: 50 mg via INTRAVENOUS

## 2012-01-27 MED ORDER — STERILE WATER FOR IRRIGATION IR SOLN
Status: DC | PRN
  Administered 2012-01-27: 13:00:00

## 2012-01-27 NOTE — H&P (View-Only) (Signed)
Primary Care Physician:  HAWKINS,EDWARD L, MD Primary Gastroenterologist:  Dr.   Pre-Procedure History & Physical: HPI:  [name] is a 71 y.o. male here for followup short segment Barrett's esophagus. Diagnosed with Barrett's esophagus last year. No dysplasia. Several soft tissue nodules came back benign the biopsies. Reflux symptoms well controlled on Prilosec 20 mg daily. He over eats or eats unusual food he may have reflux. No dysphagia status post dilation of the patent tubular esophagus last year. He has gained 7 pounds since his last office visit. He is significantly over his ideal body weight. History of tubovillous adenoma and positive family history of colon polyps. He is due for surveillance colonoscopy 4 years from now. Otherwise, no significant intercurrent illness or hospitalization.  Past Medical History  Diagnosis Date  . Neuropathy 1994  . HTN (hypertension)   . Hyperlipidemia   . Gout   . Hypothyroidism   . GERD (gastroesophageal reflux disease)   . Hx of adenomatous colonic polyps   . Barrett's esophagus   . Hiatal hernia     Past Surgical History  Procedure Date  . Thyroid nodule 1969    removed  . Hernia repair   . Esophagogastroduodenoscopy 01/2006    geographic erosive RE, noncritical peptic stricture s/p dilation, antral erosion  . Colonoscopy 12/2005    normal  . Colonoscopy 2004    tubulovillous adenoma of rectum   . Esophagogastroduodenoscopy 01/12/2011    Dr. Dailen Mcclish- hiatal hernia, distal esophageal erosions, barretts esophagus  . Maloney dilation 01/12/2011    Procedure: MALONEY DILATION;  Surgeon: Terrence Pizana M Emmagrace Runkel, MD;  Location: [facility];  Service: Endoscopy;  Laterality: N/A;  56 french  . Colonoscopy 01/12/2011    Dr. Jayma Volpi- normal rectum    Prior to Admission medications   Medication Sig Start Date End Date Taking? Authorizing Provider  amLODipine (NORVASC) 10 MG tablet Take 10 mg by mouth daily.     Yes Historical Provider, MD  atorvastatin  (LIPITOR) 20 MG tablet Take 20 mg by mouth daily.     Yes Historical Provider, MD  indomethacin (INDOCIN) 50 MG capsule Take 50 mg by mouth 2 (two) times daily with a meal.  01/06/12  Yes Historical Provider, MD  levothyroxine (SYNTHROID, LEVOTHROID) 150 MCG tablet Take 125 mcg by mouth daily.    Yes Historical Provider, MD  lisinopril-hydrochlorothiazide (PRINZIDE,ZESTORETIC) 20-12.5 MG per tablet Take 1 tablet by mouth daily.     Yes Historical Provider, MD  omeprazole (PRILOSEC OTC) 20 MG tablet Take 1 tablet (20 mg total) by mouth 2 (two) times daily. 01/12/11  Yes Josephina Melcher M Chalmers Iddings, MD  pravastatin (PRAVACHOL) 40 MG tablet Take 40 mg by mouth daily.     Yes Historical Provider, MD    Allergies as of 01/12/2012  . (No Known Allergies)    Family History  Problem Relation Age of Onset  . Colon cancer Neg Hx   . Liver disease Neg Hx   . Lung cancer Father 62  . Colon polyps Brother     History   Social History  . Marital Status: Married    Spouse Name: N/A    Number of Children: N/A  . Years of Education: N/A   Occupational History  . landscaping    Social History Main Topics  . Smoking status: Former Smoker -- 1.0 packs/day    Types: Cigarettes  . Smokeless tobacco: Former User    Quit date: 07/19/1979  . Alcohol Use: Yes       a beer every now again  . Drug Use: No  . Sexually Active: Not on file   Other Topics Concern  . Not on file   Social History Narrative  . No narrative on file    Review of Systems: See HPI, otherwise negative ROS  Physical Exam: BP 140/80  Pulse 89  Temp 98 F (36.7 C) (Temporal)  Ht 5' 9" (1.753 m)  Wt 247 lb 12.8 oz (112.401 kg)  BMI 36.59 kg/m2 General:   Alert,  Well-developed, well-nourished, pleasant and cooperative in NAD Skin:  Intact without significant lesions or rashes. Eyes:  Sclera clear, no icterus.   Conjunctiva pink. Ears:  Normal auditory acuity. Nose:  No deformity, discharge,  or lesions. Mouth:  No deformity or  lesions. Neck:  Supple; no masses or thyromegaly. No significant cervical adenopathy. Lungs:  Clear throughout to auscultation.   No wheezes, crackles, or rhonchi. No acute distress. Heart:  Regular rate and rhythm; no murmurs, clicks, rubs,  or gallops. Abdomen: Obese. Positive bowel syndrome  Soft and nontender without appreciable mass or hepatosplenomegaly.  Pulses:  Normal pulses noted. Extremities:  Without clubbing or edema.  Impression/Plan: Very pleasant 71-year-old gentleman with short segment Barrett's esophagus.. He is due for one-year surveillance EGD. Discussed the multipronged broach and reflux with this patient. He needs to lose 15-20 pounds in the next 6 months to a year. This would be very helpful.  The risks, benefits, limitations, alternatives and imponderables have been reviewed with the patient. Potential for esophageal dilation, biopsy, etc. have also been reviewed.  Questions have been answered. All parties agreeable. 

## 2012-01-27 NOTE — Op Note (Signed)
[facility] [address] [place], [place]  [address]  ENDOSCOPY PROCEDURE REPORT  PATIENT:  [name], [name]  MR#:  [id number] BIRTHDATE:  1940-11-11, 70 yrs. old  GENDER:  male  ENDOSCOPIST:  R. Roetta Sessions, MD Caleen Essex Referred by:  Kari Baars, M.D.  PROCEDURE DATE:  01/27/2012 PROCEDURE:  EGD with esophageal biopsy  INDICATIONS:   1 year surveillance - short segment Barrett's esophagus  INFORMED CONSENT:   The risks, benefits, limitations, alternatives and imponderables have been discussed.  The potential for biopsy, esophogeal dilation, etc. have also been reviewed.  Questions have been answered.  All parties agreeable.  Please see the history and physical in the medical record for more information.  MEDICATIONS:   Versed 2 mg IV and Demerol 50 mg IV Cetacaine spray.  DESCRIPTION OF PROCEDURE:   The EG-[id number] ([id number]) endoscope was introduced through the mouth and advanced to the second portion of the duodenum without difficulty or limitations.  The mucosal surfaces were surveyed very carefully during advancement of the scope and upon withdrawal.  Retroflexion view of the proximal stomach and esophagogastric junction was performed.  <<PROCEDUREIMAGES>>  FINDINGS:  2 small tongues of salmon-colored epithelium coming up within 2 cm of the GE junction. Single 1 cm island of salmon covered epithelium distal esophagus as depicted-image 1. Also (2) 1 to 1  1/2 cm areas of linear erosions coming up into the distal esophagus as noted-image 1. Stomach empty. Small hilar hernia. Antral erosions. No ulcer or infiltrating process. Pylorus patent. Normal first and second portion of the duodenum.  THERAPEUTIC / DIAGNOSTIC MANEUVERS PERFORMED:  Biopsies of salmon-colored epithelium taken for histologic study.  COMPLICATIONS:   None  IMPRESSION:  Short segment Barrett's esophagus - status post biopsy. Erosive reflux esophagitis-indicating in-adequate  acid suppression therapy.  RECOMMENDATIONS:  Increase omeprazole to 40 mg orally twice daily. Lose weight. Follow up on pathology.  ______________________________ R. Roetta Sessions, MD Caleen Essex  CC:  n. eSIGNED:   R. Casimiro Needle Rourk at 01/27/2012 01:10 PM  Lauretta Grill, [phone]

## 2012-01-27 NOTE — Interval H&P Note (Signed)
History and Physical Interval Note:  01/27/2012 12:48 PM  [name]  has presented today for surgery, with the diagnosis of Barretts Esophagus  The various methods of treatment have been discussed with the patient and family. After consideration of risks, benefits and other options for treatment, the patient has consented to  Procedure(s) (LRB): ESOPHAGOGASTRODUODENOSCOPY (EGD) (N/A) as a surgical intervention .  The patient's history has been reviewed, patient examined, no change in status, stable for surgery.  I have reviewed the patient's chart and labs.  Questions were answered to the patient's satisfaction.     Eula Listen

## 2012-01-31 ENCOUNTER — Encounter: Payer: Self-pay | Admitting: Internal Medicine

## 2012-02-01 ENCOUNTER — Encounter (HOSPITAL_COMMUNITY): Payer: Self-pay | Admitting: Internal Medicine

## 2012-02-01 ENCOUNTER — Encounter: Payer: Self-pay | Admitting: *Deleted

## 2012-07-06 HISTORY — PX: ELBOW ARTHROSCOPY: SHX614

## 2012-08-30 ENCOUNTER — Encounter: Payer: Self-pay | Admitting: *Deleted

## 2012-08-31 ENCOUNTER — Ambulatory Visit (INDEPENDENT_AMBULATORY_CARE_PROVIDER_SITE_OTHER): Payer: Medicare Other | Admitting: Cardiology

## 2012-08-31 ENCOUNTER — Encounter: Payer: Self-pay | Admitting: Cardiology

## 2012-08-31 VITALS — BP 188/100 | HR 76 | Ht 69.0 in | Wt 254.4 lb

## 2012-08-31 DIAGNOSIS — E785 Hyperlipidemia, unspecified: Secondary | ICD-10-CM | POA: Insufficient documentation

## 2012-08-31 DIAGNOSIS — E039 Hypothyroidism, unspecified: Secondary | ICD-10-CM | POA: Insufficient documentation

## 2012-08-31 DIAGNOSIS — D126 Benign neoplasm of colon, unspecified: Secondary | ICD-10-CM

## 2012-08-31 DIAGNOSIS — R0789 Other chest pain: Secondary | ICD-10-CM

## 2012-08-31 DIAGNOSIS — K2270 Barrett's esophagus without dysplasia: Secondary | ICD-10-CM

## 2012-08-31 DIAGNOSIS — I10 Essential (primary) hypertension: Secondary | ICD-10-CM | POA: Insufficient documentation

## 2012-08-31 DIAGNOSIS — R001 Bradycardia, unspecified: Secondary | ICD-10-CM | POA: Insufficient documentation

## 2012-08-31 DIAGNOSIS — R079 Chest pain, unspecified: Secondary | ICD-10-CM

## 2012-08-31 NOTE — Patient Instructions (Addendum)
Your physician recommends that you schedule a follow-up appointment in: 1 month  Your physician recommends that you return for lab work in: This week  Your physician has requested that you have a stress echocardiogram. For further information please visit [email or web address] Please follow instruction sheet as given.

## 2012-08-31 NOTE — Progress Notes (Deleted)
Name: [name]    DOB: June 16, 1941  Age: 72 y.o.  MR#: [id number]       PCP:  Fredirick Maudlin, MD      Insurance: Payor: MEDICARE  Plan: MEDICARE PART A AND B  Product Type: *No Product type*    CC:   No chief complaint on file.   VS Filed Vitals:   08/31/12 1047  BP: 188/100  Pulse: 76  Height: 5\' 9"  (1.753 m)  Weight: 254 lb 6.4 oz (115.395 kg)    Weights Current Weight  08/31/12 254 lb 6.4 oz (115.395 kg)  01/27/12 247 lb (112.038 kg)  01/27/12 247 lb (112.038 kg)    Blood Pressure  BP Readings from Last 3 Encounters:  08/31/12 188/100  01/27/12 107/71  01/27/12 107/71     Admit date:  (Not on file) Last encounter with RMR:  Visit date not found   Allergy Review of patient's allergies indicates no known allergies.  Current Outpatient Prescriptions  Medication Sig Dispense Refill  . amLODipine (NORVASC) 10 MG tablet Take 10 mg by mouth daily.        Marland Kitchen levothyroxine (SYNTHROID, LEVOTHROID) 200 MCG tablet Take 200 mcg by mouth daily.      Marland Kitchen lisinopril-hydrochlorothiazide (PRINZIDE,ZESTORETIC) 20-12.5 MG per tablet Take 1 tablet by mouth 2 (two) times daily.       Marland Kitchen omeprazole (PRILOSEC OTC) 20 MG tablet Take 1 tablet (20 mg total) by mouth 2 (two) times daily.       No current facility-administered medications for this visit.    Discontinued Meds:    Medications Discontinued During This Encounter  Medication Reason  . allopurinol (ZYLOPRIM) 300 MG tablet Error  . atorvastatin (LIPITOR) 20 MG tablet Error  . indomethacin (INDOCIN) 50 MG capsule Error  . colchicine 0.6 MG tablet Error    Patient Active Problem List  Diagnosis  . COLONIC POLYPS, ADENOMATOUS  . GASTROESOPHAGEAL REFLUX DISEASE, CHRONIC  . Hypertension  . Hyperlipidemia  . Hypothyroidism  . Bradycardia    LABS No results found for this basename: na, k, cl, co2, glucose, bun, creatinine, calcium, gfrnonaa, gfraa   CMP  No results found for this basename: na, k, cl, co2, glucose, bun,  creatinine, calcium, prot, albumin, ast, alt, alkphos, bilitot, gfrnonaa, gfraa    No results found for this basename: wbc, hgb, hct, mcv, platelets    Lipid Panel  No results found for this basename: chol, trig, hdl, cholhdl, vldl, ldlcalc    ABG No results found for this basename: phart, pco2, pco2art, po2, po2art, hco3, tco2, acidbasedef, o2sat     No results found for this basename: TSH   BNP (last 3 results) No results found for this basename: PROBNP,  in the last 8760 hours Cardiac Panel (last 3 results) No results found for this basename: CKTOTAL, CKMB, TROPONINI, RELINDX,  in the last 72 hours  Iron/TIBC/Ferritin No results found for this basename: iron, tibc, ferritin     EKG Orders placed in visit on 08/31/12  . EKG 12-LEAD     Prior Assessment and Plan Problem List as of 08/31/2012     ICD-9-CM     Cardiology Problems   Hypertension   Hyperlipidemia     Other   COLONIC POLYPS, ADENOMATOUS   Last Assessment & Plan   12/17/2010 Office Visit Written 12/17/2010  4:58 PM by Tiffany Kocher, PA     Due for surveillance colonoscopy given history of tubulovillous adenoma in the past. I have discussed  the risks, alternatives, benefits with regards to but not limited to the risk of reaction to medication, bleeding, infection, perforation and the patient is agreeable to proceed. Written consent to be obtained.     GASTROESOPHAGEAL REFLUX DISEASE, CHRONIC   Last Assessment & Plan   12/17/2010 Office Visit Written 12/17/2010  4:57 PM by Tiffany Kocher, PA     Continue Prilosec OTC 20 mg daily for now. Based on EGD findings we may need to alter therapy.    Hypothyroidism   Bradycardia       Imaging: No results found.

## 2012-09-01 ENCOUNTER — Encounter: Payer: Self-pay | Admitting: Cardiology

## 2012-09-01 DIAGNOSIS — R0789 Other chest pain: Secondary | ICD-10-CM | POA: Insufficient documentation

## 2012-09-01 DIAGNOSIS — K2270 Barrett's esophagus without dysplasia: Secondary | ICD-10-CM | POA: Insufficient documentation

## 2012-09-01 NOTE — Progress Notes (Signed)
Patient ID: [name], male   DOB: 04-Jul-1941, 72 y.o.   MRN: [id number]  HPI: Initial Cardiology evaluation for this pleasant 72 year old gentleman previously seen for asymptomatic sinus bradycardia and now referred by Fredirick Maudlin, MD for assessment of chest discomfort and dyspnea.  Clinical evaluation in 2009 for sinus bradycardia in the 40s required no testing and identified no significant cardiac issues.  Patient has subsequently done well, but recently notes unpredictable and brief episodes of dyspnea, typically associated with mild to moderate exertion, and intermittent episodes of sharp left inframammary chest discomfort that is relatively mild and brief and without associated symptoms. There is no relationship between chest discomfort and activity.  Office notes of Dr. Juanetta Gosling obtained and reviewed.  Current Outpatient Prescriptions on File Prior to Visit  Medication Sig Dispense Refill  . amLODipine (NORVASC) 10 MG tablet Take 10 mg by mouth daily.        Marland Kitchen levothyroxine (SYNTHROID, LEVOTHROID) 200 MCG tablet Take 200 mcg by mouth daily.      Marland Kitchen lisinopril-hydrochlorothiazide (PRINZIDE,ZESTORETIC) 20-12.5 MG per tablet Take 1 tablet by mouth 2 (two) times daily.       Marland Kitchen omeprazole (PRILOSEC OTC) 20 MG tablet Take 1 tablet (20 mg total) by mouth 2 (two) times daily.       No current facility-administered medications on file prior to visit.   No Known Allergies  Past Medical History  Diagnosis Date  . Neuropathy 1994  . Hypertension   . Hyperlipidemia   . Gout   . Hypothyroidism     Following partial thyroidectomy  . GERD (gastroesophageal reflux disease)   . Hx of adenomatous colonic polyps   . Barrett's esophagus   . Hiatal hernia   . Bradycardia 2009    Sinus rhythm in the 40s without symptoms;    Past Surgical History  Procedure Laterality Date  . Thyroidectomy, partial  1969    Right lobe-nodule present  . Abdominal hernia repair    . Esophagogastroduodenoscopy   01/2006    geographic erosive RE, noncritical peptic stricture s/p dilation, antral erosion  . Colonoscopy  12/2005    normal  . Colonoscopy  2004    tubulovillous adenoma of rectum   . Esophagogastroduodenoscopy  01/12/2011    Dr. Jena Gauss- hiatal hernia, distal esophageal erosions, barretts esophagus  . Maloney dilation  01/12/2011    Procedure: Elease Hashimoto DILATION;  Surgeon: Corbin Ade, MD;  Location: [facility];  Service: Endoscopy;  Laterality: N/A;  56 french  . Colonoscopy  01/12/2011    Dr. Jena Gauss- normal rectum  . Esophagogastroduodenoscopy  01/27/2012    Procedure: ESOPHAGOGASTRODUODENOSCOPY (EGD);  Surgeon: Corbin Ade, MD;  Location: [facility];  Service: Endoscopy;  Laterality: N/A;  12:30    Family History  Problem Relation Age of Onset  . Colon cancer Neg Hx   . Liver disease Neg Hx   . Lung cancer Father 41  . Colon polyps Brother     History   Social History  . Marital Status: Married    Spouse Name: N/A    Number of Children: 2  . Years of Education: N/A   Occupational History  . [employer]     Current employment  . [employer]     Prior employment   Social History Main Topics  . Smoking status: Former Smoker -- 1.00 packs/day    Types: Cigarettes  . Smokeless tobacco: Former Neurosurgeon    Quit date: 07/19/1979  . Alcohol  Use: Yes     Comment: a beer every now again  . Drug Use: No  . Sexually Active: Not on file   Other Topics Concern  . Not on file   Social History Narrative  . No narrative on file    ROS: Patient notes recent impaired energy, which he attributes to minimal residual hypothyroidism; notes occasional palpitations that are brief and not.  All other systems reviewed and are ne pedal edema; GERD symptoms; colonoscopy and polypectomy in 2012; frequent headaches; dizziness that resolved after discontinuation of allopurinol; all other systems reviewed and are negative.  PHYSICAL EXAM: BP 188/100  Pulse 76  Ht 5\' 9"  (1.753 m)   Wt 115.395 kg (254 lb 6.4 oz)  BMI 37.55 kg/m2; Repeat blood pressure: 148/82.    Body mass index is 37.55 kg/(m^2).   General-Well-developed; no acute distress Body Habitus-proportionate weight and height HEENT-[place]/AT; PERRL; EOM intact; conjunctiva and lids nl Neck-No JVD; no carotid bruits Endocrine-No thyromegaly Lungs-Clear lung fields; resonant percussion; normal I-to-E ratio Cardiovascular- normal PMI; normal S1 and S2 Abdomen-BS normal; soft and non-tender without masses or organomegaly Musculoskeletal-No deformities, cyanosis or clubbing Neurologic-Nl cranial nerves; symmetric strength and tone Skin- Warm, no significant lesions Extremities-Nl distal pulses;  trace  edema  Lipscomb Bing, MD 09/01/2012  7:30 AM  ASSESSMENT AND PLAN

## 2012-09-01 NOTE — Assessment & Plan Note (Addendum)
Blood pressure was elevated on presentation, but subsequently decreased. Patient reports occasional values obtained at home are normal. He will continue to monitor and return a list of home blood pressures to [place].

## 2012-09-01 NOTE — Assessment & Plan Note (Signed)
No recent lipid profile available.  One will be obtained.

## 2012-09-01 NOTE — Assessment & Plan Note (Signed)
Symptoms are far from classic for myocardial ischemia. Due to multiple cardiovascular risk factors, we will proceed with a stress echocardiogram, which will also permit assessment of left ventricular function to further assess dyspnea. Basic laboratory studies pending.

## 2012-09-02 ENCOUNTER — Encounter: Payer: Self-pay | Admitting: *Deleted

## 2012-09-02 LAB — CBC
HCT: 41.4 % (ref 39.0–52.0)
Hemoglobin: 14.4 g/dL (ref 13.0–17.0)
MCH: 30 pg (ref 26.0–34.0)
MCHC: 34.8 g/dL (ref 30.0–36.0)
MCV: 86.3 fL (ref 78.0–100.0)
Platelets: 202 10*3/uL (ref 150–400)
RBC: 4.8 MIL/uL (ref 4.22–5.81)
RDW: 14.2 % (ref 11.5–15.5)
WBC: 6.5 10*3/uL (ref 4.0–10.5)

## 2012-09-02 LAB — COMPREHENSIVE METABOLIC PANEL
ALT: 22 U/L (ref 0–53)
AST: 19 U/L (ref 0–37)
Albumin: 4.5 g/dL (ref 3.5–5.2)
Alkaline Phosphatase: 55 U/L (ref 39–117)
BUN: 20 mg/dL (ref 6–23)
CO2: 29 mEq/L (ref 19–32)
Calcium: 9.8 mg/dL (ref 8.4–10.5)
Chloride: 102 mEq/L (ref 96–112)
Creat: 1.14 mg/dL (ref 0.50–1.35)
Glucose, Bld: 82 mg/dL (ref 70–99)
Potassium: 4.2 mEq/L (ref 3.5–5.3)
Sodium: 141 mEq/L (ref 135–145)
Total Bilirubin: 0.6 mg/dL (ref 0.3–1.2)
Total Protein: 6.8 g/dL (ref 6.0–8.3)

## 2012-09-02 LAB — LIPID PANEL
Cholesterol: 224 mg/dL — ABNORMAL HIGH (ref 0–200)
HDL: 35 mg/dL — ABNORMAL LOW (ref >39)
LDL Cholesterol: 137 mg/dL — ABNORMAL HIGH (ref 0–99)
Total CHOL/HDL Ratio: 6.4 Ratio
Triglycerides: 258 mg/dL — ABNORMAL HIGH (ref <150)
VLDL: 52 mg/dL — ABNORMAL HIGH (ref 0–40)

## 2012-09-04 ENCOUNTER — Encounter: Payer: Self-pay | Admitting: Cardiology

## 2012-09-05 ENCOUNTER — Other Ambulatory Visit: Payer: Self-pay | Admitting: *Deleted

## 2012-09-05 ENCOUNTER — Encounter: Payer: Self-pay | Admitting: *Deleted

## 2012-09-05 ENCOUNTER — Encounter: Payer: Self-pay | Admitting: Cardiology

## 2012-09-05 DIAGNOSIS — R0600 Dyspnea, unspecified: Secondary | ICD-10-CM

## 2012-09-06 ENCOUNTER — Encounter: Payer: Self-pay | Admitting: *Deleted

## 2012-09-13 ENCOUNTER — Ambulatory Visit (HOSPITAL_COMMUNITY)
Admission: RE | Admit: 2012-09-13 | Discharge: 2012-09-13 | Disposition: A | Discharge status: Home / Self Care | Payer: Medicare Other | Source: Ambulatory Visit | Attending: Cardiology | Admitting: Cardiology

## 2012-09-13 DIAGNOSIS — R072 Precordial pain: Secondary | ICD-10-CM

## 2012-09-13 DIAGNOSIS — I10 Essential (primary) hypertension: Secondary | ICD-10-CM | POA: Insufficient documentation

## 2012-09-13 DIAGNOSIS — E785 Hyperlipidemia, unspecified: Secondary | ICD-10-CM | POA: Insufficient documentation

## 2012-09-13 DIAGNOSIS — R0789 Other chest pain: Secondary | ICD-10-CM

## 2012-09-13 DIAGNOSIS — R079 Chest pain, unspecified: Secondary | ICD-10-CM | POA: Insufficient documentation

## 2012-09-13 NOTE — Progress Notes (Signed)
*  PRELIMINARY RESULTS* Echocardiogram Echocardiogram Stress Test has been performed.  Conrad  09/13/2012, 10:19 AM

## 2012-09-13 NOTE — Progress Notes (Signed)
Stress Lab Nurses Notes - [name] 09/13/2012 Reason for doing test: Chest Pain Type of test: Stress Echo Nurse performing test: Parke Poisson, RN Nuclear Medicine Tech: Not Applicable Echo Tech: Karrie Doffing MD performing test: Ival Bible & Joni Reining NP Family MD: Juanetta Gosling Test explained and consent signed: yes IV started: No IV started Symptoms: SOB & Fatigue Treatment/Intervention: None Reason test stopped: fatigue and reached target HR After recovery IV was: NA Patient to return to Nuc. Med at : NA Patient discharged: Home Patient's Condition upon discharge was: stable Comments: During test peak BP 167/85 & HR 157.  Recovery BP 140/72 & HR 92.  Symptoms resolved in recovery. Erskine Speed T

## 2012-10-12 ENCOUNTER — Ambulatory Visit (INDEPENDENT_AMBULATORY_CARE_PROVIDER_SITE_OTHER): Payer: Medicare Other | Admitting: Cardiology

## 2012-10-12 ENCOUNTER — Encounter: Payer: Self-pay | Admitting: Cardiology

## 2012-10-12 VITALS — BP 162/88 | HR 74 | Ht 69.0 in | Wt 248.0 lb

## 2012-10-12 DIAGNOSIS — I10 Essential (primary) hypertension: Secondary | ICD-10-CM

## 2012-10-12 DIAGNOSIS — I498 Other specified cardiac arrhythmias: Secondary | ICD-10-CM

## 2012-10-12 DIAGNOSIS — R001 Bradycardia, unspecified: Secondary | ICD-10-CM

## 2012-10-12 DIAGNOSIS — E785 Hyperlipidemia, unspecified: Secondary | ICD-10-CM

## 2012-10-12 DIAGNOSIS — R0789 Other chest pain: Secondary | ICD-10-CM

## 2012-10-12 DIAGNOSIS — E782 Mixed hyperlipidemia: Secondary | ICD-10-CM

## 2012-10-12 MED ORDER — PRAVASTATIN SODIUM 10 MG PO TABS: 10.0000 mg | ORAL_TABLET | Freq: Every day | ORAL | Status: DC

## 2012-10-12 NOTE — Progress Notes (Deleted)
Name: [name]    DOB: 26-Feb-1941  Age: 72 y.o.  MR#: [id number]       PCP:  Fredirick Maudlin, MD      Insurance: Payor: MEDICARE  Plan: MEDICARE PART A AND B  Product Type: *No Product type*    CC:   LIST  VS Filed Vitals:   10/12/12 1437  BP: 162/88  Pulse: 74  Height: 5\' 9"  (1.753 m)  Weight: 248 lb (112.492 kg)    Weights Current Weight  10/12/12 248 lb (112.492 kg)  08/31/12 254 lb 6.4 oz (115.395 kg)  01/27/12 247 lb (112.038 kg)    Blood Pressure  BP Readings from Last 3 Encounters:  10/12/12 162/88  08/31/12 188/100  01/27/12 107/71     Admit date:  (Not on file) Last encounter with RMR:  08/31/2012   Allergy Review of patient's allergies indicates no known allergies.  Current Outpatient Prescriptions  Medication Sig Dispense Refill  . amLODipine (NORVASC) 10 MG tablet Take 10 mg by mouth daily.        Marland Kitchen levothyroxine (SYNTHROID, LEVOTHROID) 200 MCG tablet Take 200 mcg by mouth daily.      Marland Kitchen lisinopril-hydrochlorothiazide (PRINZIDE,ZESTORETIC) 20-12.5 MG per tablet Take 1 tablet by mouth 2 (two) times daily.       Marland Kitchen omeprazole (PRILOSEC OTC) 20 MG tablet Take 1 tablet (20 mg total) by mouth 2 (two) times daily.       No current facility-administered medications for this visit.    Discontinued Meds:    Medications Discontinued During This Encounter  Medication Reason  . pantoprazole (PROTONIX) 40 MG tablet Patient Preference    Patient Active Problem List  Diagnosis  . COLONIC POLYPS, ADENOMATOUS  . GASTROESOPHAGEAL REFLUX DISEASE, CHRONIC  . Hypertension  . Hyperlipidemia  . Hypothyroidism  . Bradycardia  . Barrett's esophagus  . Chest discomfort + dyspnea    LABS    Component Value Date/Time   NA 141 08/31/2012 1149   K 4.2 08/31/2012 1149   CL 102 08/31/2012 1149   CO2 29 08/31/2012 1149   GLUCOSE 82 08/31/2012 1149   BUN 20 08/31/2012 1149   CREATININE 1.14 08/31/2012 1149   CALCIUM 9.8 08/31/2012 1149   CMP     Component Value  Date/Time   NA 141 08/31/2012 1149   K 4.2 08/31/2012 1149   CL 102 08/31/2012 1149   CO2 29 08/31/2012 1149   GLUCOSE 82 08/31/2012 1149   BUN 20 08/31/2012 1149   CREATININE 1.14 08/31/2012 1149   CALCIUM 9.8 08/31/2012 1149   PROT 6.8 08/31/2012 1149   ALBUMIN 4.5 08/31/2012 1149   AST 19 08/31/2012 1149   ALT 22 08/31/2012 1149   ALKPHOS 55 08/31/2012 1149   BILITOT 0.6 08/31/2012 1149       Component Value Date/Time   WBC 6.5 08/31/2012 1149   HGB 14.4 08/31/2012 1149   HCT 41.4 08/31/2012 1149   MCV 86.3 08/31/2012 1149    Lipid Panel     Component Value Date/Time   CHOL 224* 08/31/2012 1149   TRIG 258* 08/31/2012 1149   HDL 35* 08/31/2012 1149   CHOLHDL 6.4 08/31/2012 1149   VLDL 52* 08/31/2012 1149   LDLCALC 137* 08/31/2012 1149    ABG No results found for this basename: phart, pco2, pco2art, po2, po2art, hco3, tco2, acidbasedef, o2sat     No results found for this basename: TSH   BNP (last 3 results) No results found for this basename: PROBNP,  in the last 8760 hours Cardiac Panel (last 3 results) No results found for this basename: CKTOTAL, CKMB, TROPONINI, RELINDX,  in the last 72 hours  Iron/TIBC/Ferritin No results found for this basename: iron, tibc, ferritin     EKG Orders placed in visit on 08/31/12  . EKG 12-LEAD     Prior Assessment and Plan Problem List as of 10/12/2012     ICD-9-CM   COLONIC POLYPS, ADENOMATOUS   Last Assessment & Plan   12/17/2010 Office Visit Written 12/17/2010  4:58 PM by Tiffany Kocher, PA     Due for surveillance colonoscopy given history of tubulovillous adenoma in the past. I have discussed the risks, alternatives, benefits with regards to but not limited to the risk of reaction to medication, bleeding, infection, perforation and the patient is agreeable to proceed. Written consent to be obtained.     GASTROESOPHAGEAL REFLUX DISEASE, CHRONIC   Last Assessment & Plan   12/17/2010 Office Visit Written 12/17/2010  4:57 PM by Tiffany Kocher,  PA     Continue Prilosec OTC 20 mg daily for now. Based on EGD findings we may need to alter therapy.    Hypertension   Last Assessment & Plan   08/31/2012 Office Visit Edited 09/03/2012 11:07 PM by Kathlen Brunswick, MD     Blood pressure was elevated on presentation, but subsequently decreased. Patient reports occasional values obtained at home are normal. He will continue to monitor and return a list of home blood pressures to [place].    Hyperlipidemia   Last Assessment & Plan   08/31/2012 Office Visit Written 09/01/2012  7:40 AM by Kathlen Brunswick, MD     No recent lipid profile available.  One will be obtained.    Hypothyroidism   Bradycardia   Barrett's esophagus   Chest discomfort + dyspnea   Last Assessment & Plan   08/31/2012 Office Visit Written 09/01/2012  7:42 AM by Kathlen Brunswick, MD     Symptoms are far from classic for myocardial ischemia. Due to multiple cardiovascular risk factors, we will proceed with a stress echocardiogram, which will also permit assessment of left ventricular function to further assess dyspnea. Basic laboratory studies pending.        Imaging: No results found.

## 2012-10-12 NOTE — Assessment & Plan Note (Signed)
Borderline hypertension. Patient will continue to monitor at home and return in 2 months for reassessment.

## 2012-10-12 NOTE — Assessment & Plan Note (Signed)
Patient has been told of an irregular rhythm, but only bradycardia apparently documented. No further evaluation is warranted at present.

## 2012-10-12 NOTE — Progress Notes (Signed)
Patient ID: [name], male   DOB: 16-Jul-1940, 72 y.o.   MRN: [id number]  HPI: Schedule return visit for this nice gentleman returns for continuing assessment of hypertension, bradycardia and chest discomfort. The latter has resolved. Blood pressure control has been good when assessed outside of medical settings and by patient's PCP.  Previous symptoms of vertigo resolved when treatment with allopurinol was discontinued.  Current Outpatient Prescriptions  Medication Sig Dispense Refill  . amLODipine (NORVASC) 10 MG tablet Take 10 mg by mouth daily.        Marland Kitchen levothyroxine (SYNTHROID, LEVOTHROID) 200 MCG tablet Take 200 mcg by mouth daily.      Marland Kitchen lisinopril-hydrochlorothiazide (PRINZIDE,ZESTORETIC) 20-12.5 MG per tablet Take 1 tablet by mouth 2 (two) times daily.       Marland Kitchen omeprazole (PRILOSEC OTC) 20 MG tablet Take 1 tablet (20 mg total) by mouth 2 (two) times daily.      . pravastatin (PRAVACHOL) 10 MG tablet Take 1 tablet (10 mg total) by mouth daily.  60 tablet  5   No current facility-administered medications for this visit.   No Known Allergies   Past medical history, social history, and family history reviewed and updated.  ROS: Denies chest discomfort, palpitations, lightheadedness or syncope. He has intermittent dyspnea on moderate exertion but resolves promptly with rest.  PHYSICAL EXAM: BP 162/88  Pulse 74  Ht 5\' 9"  (1.753 m)  Wt 112.492 kg (248 lb)  BMI 36.61 kg/m2; Repeat BP-140/75 with a large cuff Body mass index is 36.61 kg/(m^2). General-Well developed; no acute distress Body habitus-moderately overweight Neck-No JVD; no carotid bruits Lungs-clear lung fields; resonant to percussion Cardiovascular-normal PMI; normal S1 and S2; grade 1-2/6 basilar systolic ejection murmur Abdomen-normal bowel sounds; soft and non-tender without masses or organomegaly Musculoskeletal-No deformities, no cyanosis or clubbing Neurologic-Normal cranial nerves; symmetric strength and  tone Skin-Warm, no significant lesions Extremities-distal pulses intact; no edema  Benjamin Bing, MD 10/12/2012  4:39 PM  ASSESSMENT AND PLAN

## 2012-10-12 NOTE — Patient Instructions (Addendum)
Your physician recommends that you schedule a follow-up appointment in: 4 months  Your physician recommends that you return for lab work in: 2 months Your physician recommends that you have follow up lab work, we will mail you a reminder letter to alert you when to go [employer], located across the street from our office.   Your physician encouraged you to lose weight for better health.  Your physician recommends that you monitor your blood pressures at home and bring them in with you at your follow up visit on the forms given today  Your physician has recommended you make the following change in your medication:   1) START PRAVACHOL 10MG  ONE TABLET DAILY (PLEASE NOTE IF NO LEG PAIN IS NOTED INCREASE TO 20MG  DAILY AFTER ONE MONTH OF TAKING 10MG  DOSAGE)

## 2012-10-12 NOTE — Assessment & Plan Note (Signed)
Dyspnea on exertion likely multifactorial including physical deconditioning, excessive weight and perhaps sinus node dysfunction. Weight loss and increased activity advised. Stress echocardiogram: Ischemic EKG response with normal stress imaging suggesting a false-positive stress EKG and no significant coronary artery disease. No further testing for ischemic heart disease will be necessary at present.

## 2012-10-12 NOTE — Assessment & Plan Note (Signed)
Patient has moderate risk for vascular disease with mild elevation of total and LDL cholesterol.  Previous statin intolerance with pain in the legs. We will start a low-dose pravastatin to determine if he can tolerate that medication and advance from 10-20 mg if so. Lipid profile will be repeated in 2 months.

## 2012-10-14 ENCOUNTER — Encounter: Payer: Self-pay | Admitting: *Deleted

## 2012-12-14 ENCOUNTER — Other Ambulatory Visit: Payer: Self-pay | Admitting: *Deleted

## 2012-12-14 ENCOUNTER — Encounter: Payer: Self-pay | Admitting: *Deleted

## 2012-12-14 DIAGNOSIS — E782 Mixed hyperlipidemia: Secondary | ICD-10-CM

## 2013-01-12 ENCOUNTER — Other Ambulatory Visit: Payer: Self-pay | Admitting: *Deleted

## 2013-01-12 ENCOUNTER — Encounter: Payer: Self-pay | Admitting: *Deleted

## 2013-01-12 DIAGNOSIS — E782 Mixed hyperlipidemia: Secondary | ICD-10-CM

## 2013-01-16 ENCOUNTER — Encounter (HOSPITAL_COMMUNITY): Payer: Self-pay | Admitting: Cardiology

## 2013-06-22 ENCOUNTER — Telehealth: Payer: Self-pay | Admitting: Internal Medicine

## 2013-06-22 MED ORDER — OMEPRAZOLE 40 MG PO CPDR: 40.0000 mg | DELAYED_RELEASE_CAPSULE | Freq: Two times a day (BID) | ORAL | Status: DC

## 2013-06-22 NOTE — Telephone Encounter (Signed)
Routing to refill box  

## 2013-06-22 NOTE — Telephone Encounter (Signed)
Pt came to front office window asking for a RF on his Omeprazole 40 mg and for [place] to call it in at [facility] in [place], [place] [phone]). He also asked if he could get a 90 day supply. Please advise. [phone]

## 2013-06-22 NOTE — Telephone Encounter (Signed)
done

## 2013-12-15 ENCOUNTER — Other Ambulatory Visit (HOSPITAL_COMMUNITY): Payer: Self-pay | Admitting: Orthopaedic Surgery

## 2013-12-22 ENCOUNTER — Encounter (HOSPITAL_COMMUNITY): Payer: Self-pay | Admitting: Pharmacist

## 2013-12-25 NOTE — Pre-Procedure Instructions (Signed)
[name]  12/25/2013   Your procedure is scheduled on:  Friday, July 3rd  Report to [facility] Admitting at 1030 AM.  Call this number if you have problems the morning of surgery: [phone]   Remember:   Do not eat food or drink liquids after midnight.   Take these medicines the morning of surgery with A SIP OF WATER: norvasc, prilosec, synthroid, prednisone   Do not wear jewelry.  Do not wear lotions, powders, or perfumes. You may wear deodorant.  Do not shave 48 hours prior to surgery. Men may shave face and neck.  Do not bring valuables to the hospital.  [facility] is not responsible for any belongings or valuables.               Contacts, dentures or bridgework may not be worn into surgery.  Leave suitcase in the car. After surgery it may be brought to your room.  For patients admitted to the hospital, discharge time is determined by your  treatment team.         Please read over the following fact sheets that you were given: Pain Booklet, Coughing and Deep Breathing, Blood Transfusion Information, MRSA Information and Surgical Site Infection Prevention [facility] - Preparing for Surgery  Before surgery, you can play an important role.  Because skin is not sterile, your skin needs to be as free of germs as possible.  You can reduce the number of germs on you skin by washing with CHG (chlorahexidine gluconate) soap before surgery.  CHG is an antiseptic cleaner which kills germs and bonds with the skin to continue killing germs even after washing.  Please DO NOT use if you have an allergy to CHG or antibacterial soaps.  If your skin becomes reddened/irritated stop using the CHG and inform your nurse when you arrive at Short Stay.  Do not shave (including legs and underarms) for at least 48 hours prior to the first CHG shower.  You may shave your face.  Please follow these instructions carefully:   1.  Shower with CHG Soap the night before surgery and the  morning of Surgery.  2.  If you choose to wash your hair, wash your hair first as usual with your normal shampoo.  3.  After you shampoo, rinse your hair and body thoroughly to remove the shampoo.  4.  Use CHG as you would any other liquid soap.  You can apply CHG directly to the skin and wash gently with scrungie or a clean washcloth.  5.  Apply the CHG Soap to your body ONLY FROM THE NECK DOWN.  Do not use on open wounds or open sores.  Avoid contact with your eyes, ears, mouth and genitals (private parts).  Wash genitals (private parts) with your normal soap.  6.  Wash thoroughly, paying special attention to the area where your surgery will be performed.  7.  Thoroughly rinse your body with warm water from the neck down.  8.  DO NOT shower/wash with your normal soap after using and rinsing off the CHG Soap.  9.  Pat yourself dry with a clean towel.            10.  Wear clean pajamas.            11.  Place clean sheets on your bed the night of your first shower and do not sleep with pets.  Day of Surgery  Do not apply any lotions/deoderants the  morning of surgery.  Please wear clean clothes to the hospital/surgery center.

## 2013-12-26 ENCOUNTER — Encounter (HOSPITAL_COMMUNITY)
Admission: RE | Admit: 2013-12-26 | Discharge: 2013-12-26 | Disposition: A | Discharge status: Home / Self Care | Payer: Medicare Other | Source: Ambulatory Visit | Attending: Orthopaedic Surgery | Admitting: Orthopaedic Surgery

## 2013-12-26 ENCOUNTER — Ambulatory Visit (HOSPITAL_COMMUNITY)
Admission: RE | Admit: 2013-12-26 | Discharge: 2013-12-26 | Disposition: A | Discharge status: Home / Self Care | Payer: Medicare Other | Source: Ambulatory Visit | Attending: Anesthesiology | Admitting: Anesthesiology

## 2013-12-26 ENCOUNTER — Encounter (HOSPITAL_COMMUNITY): Payer: Self-pay

## 2013-12-26 DIAGNOSIS — Z01810 Encounter for preprocedural cardiovascular examination: Secondary | ICD-10-CM | POA: Diagnosis not present

## 2013-12-26 DIAGNOSIS — Z01818 Encounter for other preprocedural examination: Secondary | ICD-10-CM | POA: Insufficient documentation

## 2013-12-26 HISTORY — DX: Unspecified osteoarthritis, unspecified site: M19.90

## 2013-12-26 LAB — CBC
HCT: 41.4 % (ref 39.0–52.0)
Hemoglobin: 13.5 g/dL (ref 13.0–17.0)
MCH: 29.8 pg (ref 26.0–34.0)
MCHC: 32.6 g/dL (ref 30.0–36.0)
MCV: 91.4 fL (ref 78.0–100.0)
Platelets: 208 10*3/uL (ref 150–400)
RBC: 4.53 MIL/uL (ref 4.22–5.81)
RDW: 12.9 % (ref 11.5–15.5)
WBC: 13.8 10*3/uL — ABNORMAL HIGH (ref 4.0–10.5)

## 2013-12-26 LAB — URINALYSIS, ROUTINE W REFLEX MICROSCOPIC
Bilirubin Urine: NEGATIVE
Glucose, UA: NEGATIVE mg/dL
Hgb urine dipstick: NEGATIVE
Ketones, ur: NEGATIVE mg/dL
Leukocytes, UA: NEGATIVE
Nitrite: NEGATIVE
Protein, ur: NEGATIVE mg/dL
Specific Gravity, Urine: 1.016 (ref 1.005–1.030)
Urobilinogen, UA: 0.2 mg/dL (ref 0.0–1.0)
pH: 7.5 (ref 5.0–8.0)

## 2013-12-26 LAB — PROTIME-INR
INR: 1.06 (ref 0.00–1.49)
Prothrombin Time: 13.6 seconds (ref 11.6–15.2)

## 2013-12-26 LAB — BASIC METABOLIC PANEL
BUN: 25 mg/dL — ABNORMAL HIGH (ref 6–23)
CO2: 30 mEq/L (ref 19–32)
Calcium: 9.5 mg/dL (ref 8.4–10.5)
Chloride: 101 mEq/L (ref 96–112)
Creatinine, Ser: 1.05 mg/dL (ref 0.50–1.35)
GFR calc Af Amer: 80 mL/min — ABNORMAL LOW (ref >90)
GFR calc non Af Amer: 69 mL/min — ABNORMAL LOW (ref >90)
Glucose, Bld: 99 mg/dL (ref 70–99)
Potassium: 4.3 mEq/L (ref 3.7–5.3)
Sodium: 143 mEq/L (ref 137–147)

## 2013-12-26 LAB — ABO/RH: ABO/RH(D): O NEG

## 2013-12-26 LAB — TYPE AND SCREEN
ABO/RH(D): O NEG
Antibody Screen: NEGATIVE

## 2013-12-26 LAB — APTT: aPTT: 27 seconds (ref 24–37)

## 2013-12-26 LAB — SURGICAL PCR SCREEN
MRSA, PCR: NEGATIVE
Staphylococcus aureus: NEGATIVE

## 2014-01-04 MED ORDER — CEFAZOLIN SODIUM-DEXTROSE 2-3 GM-% IV SOLR
2.0000 g | INTRAVENOUS | Status: AC
  Administered 2014-01-05: 2 g via INTRAVENOUS
  Filled 2014-01-04: qty 50

## 2014-01-05 ENCOUNTER — Inpatient Hospital Stay (HOSPITAL_COMMUNITY): Payer: Medicare Other

## 2014-01-05 ENCOUNTER — Inpatient Hospital Stay (HOSPITAL_COMMUNITY): Payer: Medicare Other | Admitting: Anesthesiology

## 2014-01-05 ENCOUNTER — Inpatient Hospital Stay (HOSPITAL_COMMUNITY)
Admission: RE | Admit: 2014-01-05 | Discharge: 2014-01-07 | DRG: 470 | Disposition: A | Discharge status: Home Health | Payer: Medicare Other | Source: Ambulatory Visit | Attending: Orthopaedic Surgery | Admitting: Orthopaedic Surgery

## 2014-01-05 ENCOUNTER — Encounter (HOSPITAL_COMMUNITY): Payer: Medicare Other | Admitting: Anesthesiology

## 2014-01-05 ENCOUNTER — Encounter (HOSPITAL_COMMUNITY): Payer: Self-pay | Admitting: Anesthesiology

## 2014-01-05 ENCOUNTER — Encounter (HOSPITAL_COMMUNITY)
Admission: RE | Disposition: A | Discharge status: Home Health | Payer: Self-pay | Source: Ambulatory Visit | Attending: Orthopaedic Surgery

## 2014-01-05 DIAGNOSIS — M1712 Unilateral primary osteoarthritis, left knee: Secondary | ICD-10-CM

## 2014-01-05 DIAGNOSIS — E785 Hyperlipidemia, unspecified: Secondary | ICD-10-CM | POA: Diagnosis present

## 2014-01-05 DIAGNOSIS — M1710 Unilateral primary osteoarthritis, unspecified knee: Principal | ICD-10-CM | POA: Diagnosis present

## 2014-01-05 DIAGNOSIS — E039 Hypothyroidism, unspecified: Secondary | ICD-10-CM | POA: Diagnosis present

## 2014-01-05 DIAGNOSIS — Z96659 Presence of unspecified artificial knee joint: Secondary | ICD-10-CM

## 2014-01-05 DIAGNOSIS — Z96652 Presence of left artificial knee joint: Secondary | ICD-10-CM

## 2014-01-05 DIAGNOSIS — K219 Gastro-esophageal reflux disease without esophagitis: Secondary | ICD-10-CM | POA: Diagnosis present

## 2014-01-05 DIAGNOSIS — I10 Essential (primary) hypertension: Secondary | ICD-10-CM | POA: Diagnosis present

## 2014-01-05 DIAGNOSIS — Z87891 Personal history of nicotine dependence: Secondary | ICD-10-CM

## 2014-01-05 HISTORY — DX: Reserved for concepts with insufficient information to code with codable children: IMO0002

## 2014-01-05 HISTORY — PX: TOTAL KNEE ARTHROPLASTY: SHX125

## 2014-01-05 SURGERY — ARTHROPLASTY, KNEE, TOTAL
Anesthesia: Regional | Site: Knee | Laterality: Left

## 2014-01-05 MED ORDER — ONDANSETRON HCL 4 MG/2ML IJ SOLN
INTRAMUSCULAR | Status: DC | PRN
  Administered 2014-01-05: 4 mg via INTRAVENOUS

## 2014-01-05 MED ORDER — FENTANYL CITRATE 0.05 MG/ML IJ SOLN
50.0000 ug | INTRAMUSCULAR | Status: DC | PRN
  Administered 2014-01-05: 100 ug via INTRAVENOUS

## 2014-01-05 MED ORDER — FENTANYL CITRATE 0.05 MG/ML IJ SOLN
INTRAMUSCULAR | Status: DC | PRN
  Administered 2014-01-05: 100 ug via INTRAVENOUS
  Administered 2014-01-05 (×3): 50 ug via INTRAVENOUS

## 2014-01-05 MED ORDER — METHOCARBAMOL 500 MG PO TABS
ORAL_TABLET | ORAL | Status: AC
  Administered 2014-01-05: 500 mg via ORAL
  Filled 2014-01-05: qty 1

## 2014-01-05 MED ORDER — LIDOCAINE HCL (CARDIAC) 20 MG/ML IV SOLN
INTRAVENOUS | Status: DC | PRN
  Administered 2014-01-05: 80 mg via INTRAVENOUS

## 2014-01-05 MED ORDER — MIDAZOLAM HCL 2 MG/2ML IJ SOLN
INTRAMUSCULAR | Status: AC
  Administered 2014-01-05: 1 mg via INTRAVENOUS
  Filled 2014-01-05: qty 2

## 2014-01-05 MED ORDER — METHOCARBAMOL 500 MG PO TABS
500.0000 mg | ORAL_TABLET | Freq: Four times a day (QID) | ORAL | Status: DC | PRN
  Administered 2014-01-05 – 2014-01-07 (×5): 500 mg via ORAL
  Filled 2014-01-05 (×4): qty 1

## 2014-01-05 MED ORDER — PROPOFOL 10 MG/ML IV BOLUS
INTRAVENOUS | Status: AC
  Filled 2014-01-05: qty 20

## 2014-01-05 MED ORDER — LACTATED RINGERS IV SOLN
INTRAVENOUS | Status: DC
  Administered 2014-01-05: 10:00:00 via INTRAVENOUS

## 2014-01-05 MED ORDER — MENTHOL 3 MG MT LOZG: 1.0000 | LOZENGE | OROMUCOSAL | Status: DC | PRN

## 2014-01-05 MED ORDER — MIDAZOLAM HCL 5 MG/5ML IJ SOLN
INTRAMUSCULAR | Status: DC | PRN
  Administered 2014-01-05: 2 mg via INTRAVENOUS

## 2014-01-05 MED ORDER — DOCUSATE SODIUM 100 MG PO CAPS
100.0000 mg | ORAL_CAPSULE | Freq: Two times a day (BID) | ORAL | Status: DC
  Administered 2014-01-05 – 2014-01-07 (×4): 100 mg via ORAL
  Filled 2014-01-05 (×4): qty 1

## 2014-01-05 MED ORDER — SODIUM CHLORIDE 0.9 % IV SOLN
10.0000 mg | INTRAVENOUS | Status: DC | PRN
  Administered 2014-01-05: 10 ug/min via INTRAVENOUS

## 2014-01-05 MED ORDER — PANTOPRAZOLE SODIUM 40 MG PO TBEC
80.0000 mg | DELAYED_RELEASE_TABLET | Freq: Every day | ORAL | Status: DC
  Administered 2014-01-05 – 2014-01-07 (×3): 80 mg via ORAL
  Filled 2014-01-05 (×3): qty 2

## 2014-01-05 MED ORDER — SODIUM CHLORIDE 0.9 % IV SOLN
INTRAVENOUS | Status: DC | PRN
  Administered 2014-01-05: 13:00:00 via INTRAVENOUS

## 2014-01-05 MED ORDER — EPHEDRINE SULFATE 50 MG/ML IJ SOLN
INTRAMUSCULAR | Status: DC | PRN
  Administered 2014-01-05: 5 mg via INTRAVENOUS
  Administered 2014-01-05: 10 mg via INTRAVENOUS

## 2014-01-05 MED ORDER — ARTIFICIAL TEARS OP OINT
TOPICAL_OINTMENT | OPHTHALMIC | Status: DC | PRN
  Administered 2014-01-05: 1 via OPHTHALMIC

## 2014-01-05 MED ORDER — ONDANSETRON HCL 4 MG PO TABS: 4.0000 mg | ORAL_TABLET | Freq: Four times a day (QID) | ORAL | Status: DC | PRN

## 2014-01-05 MED ORDER — ROCURONIUM BROMIDE 100 MG/10ML IV SOLN
INTRAVENOUS | Status: DC | PRN
Start: 2014-01-05 — End: 2014-01-05
  Administered 2014-01-05: 50 mg via INTRAVENOUS

## 2014-01-05 MED ORDER — LACTATED RINGERS IV SOLN
INTRAVENOUS | Status: DC | PRN
  Administered 2014-01-05 (×2): via INTRAVENOUS

## 2014-01-05 MED ORDER — ASPIRIN EC 325 MG PO TBEC
325.0000 mg | DELAYED_RELEASE_TABLET | Freq: Two times a day (BID) | ORAL | Status: DC
  Administered 2014-01-05 – 2014-01-07 (×4): 325 mg via ORAL
  Filled 2014-01-05 (×6): qty 1

## 2014-01-05 MED ORDER — SODIUM CHLORIDE 0.9 % IR SOLN
Status: DC | PRN
  Administered 2014-01-05: 3000 mL

## 2014-01-05 MED ORDER — GLYCOPYRROLATE 0.2 MG/ML IJ SOLN
INTRAMUSCULAR | Status: DC | PRN
  Administered 2014-01-05: .6 mg via INTRAVENOUS

## 2014-01-05 MED ORDER — DEXAMETHASONE SODIUM PHOSPHATE 10 MG/ML IJ SOLN
INTRAMUSCULAR | Status: DC | PRN
  Administered 2014-01-05: 10 mg via INTRAVENOUS

## 2014-01-05 MED ORDER — ACETAMINOPHEN 650 MG RE SUPP: 650.0000 mg | Freq: Four times a day (QID) | RECTAL | Status: DC | PRN

## 2014-01-05 MED ORDER — LEVOTHYROXINE SODIUM 150 MCG PO TABS
150.0000 ug | ORAL_TABLET | Freq: Every day | ORAL | Status: DC
  Administered 2014-01-06 – 2014-01-07 (×2): 150 ug via ORAL
  Filled 2014-01-05 (×3): qty 1

## 2014-01-05 MED ORDER — CEFAZOLIN SODIUM 1-5 GM-% IV SOLN
1.0000 g | Freq: Four times a day (QID) | INTRAVENOUS | Status: AC
  Administered 2014-01-05 – 2014-01-06 (×2): 1 g via INTRAVENOUS
  Filled 2014-01-05: qty 50

## 2014-01-05 MED ORDER — HYDROMORPHONE HCL PF 1 MG/ML IJ SOLN
INTRAMUSCULAR | Status: AC
  Administered 2014-01-05: 0.5 mg via INTRAVENOUS
  Filled 2014-01-05: qty 1

## 2014-01-05 MED ORDER — ALUM & MAG HYDROXIDE-SIMETH 200-200-20 MG/5ML PO SUSP: 30.0000 mL | ORAL | Status: DC | PRN

## 2014-01-05 MED ORDER — ACETAMINOPHEN 325 MG PO TABS: 650.0000 mg | ORAL_TABLET | Freq: Four times a day (QID) | ORAL | Status: DC | PRN

## 2014-01-05 MED ORDER — METHOCARBAMOL 1000 MG/10ML IJ SOLN
500.0000 mg | Freq: Four times a day (QID) | INTRAMUSCULAR | Status: DC | PRN
  Filled 2014-01-05: qty 5

## 2014-01-05 MED ORDER — METOCLOPRAMIDE HCL 10 MG PO TABS: 5.0000 mg | ORAL_TABLET | Freq: Three times a day (TID) | ORAL | Status: DC | PRN

## 2014-01-05 MED ORDER — TRANEXAMIC ACID 100 MG/ML IV SOLN
1000.0000 mg | INTRAVENOUS | Status: AC
  Administered 2014-01-05: 1000 mg via INTRAVENOUS
  Filled 2014-01-05 (×2): qty 10

## 2014-01-05 MED ORDER — FENTANYL CITRATE 0.05 MG/ML IJ SOLN
INTRAMUSCULAR | Status: AC
  Administered 2014-01-05: 100 ug via INTRAVENOUS
  Filled 2014-01-05: qty 2

## 2014-01-05 MED ORDER — HYDROCHLOROTHIAZIDE 12.5 MG PO CAPS
12.5000 mg | ORAL_CAPSULE | Freq: Every day | ORAL | Status: DC
  Administered 2014-01-06 – 2014-01-07 (×2): 12.5 mg via ORAL
  Filled 2014-01-05 (×2): qty 1

## 2014-01-05 MED ORDER — BUPIVACAINE-EPINEPHRINE (PF) 0.5% -1:200000 IJ SOLN
INTRAMUSCULAR | Status: DC | PRN
Start: 2014-01-05 — End: 2014-01-05
  Administered 2014-01-05: 30 mL via PERINEURAL

## 2014-01-05 MED ORDER — SODIUM CHLORIDE 0.9 % IV SOLN: INTRAVENOUS | Status: DC

## 2014-01-05 MED ORDER — AMLODIPINE BESYLATE 10 MG PO TABS
10.0000 mg | ORAL_TABLET | Freq: Every morning | ORAL | Status: DC
  Administered 2014-01-06 – 2014-01-07 (×2): 10 mg via ORAL
  Filled 2014-01-05 (×2): qty 1

## 2014-01-05 MED ORDER — PHENOL 1.4 % MT LIQD: 1.0000 | OROMUCOSAL | Status: DC | PRN

## 2014-01-05 MED ORDER — HYDROMORPHONE HCL PF 1 MG/ML IJ SOLN
0.2500 mg | INTRAMUSCULAR | Status: DC | PRN
  Administered 2014-01-05 (×2): 0.5 mg via INTRAVENOUS

## 2014-01-05 MED ORDER — FENTANYL CITRATE 0.05 MG/ML IJ SOLN
INTRAMUSCULAR | Status: AC
  Filled 2014-01-05: qty 5

## 2014-01-05 MED ORDER — LISINOPRIL 10 MG PO TABS
10.0000 mg | ORAL_TABLET | Freq: Every day | ORAL | Status: DC
  Administered 2014-01-06 – 2014-01-07 (×2): 10 mg via ORAL
  Filled 2014-01-05 (×2): qty 1

## 2014-01-05 MED ORDER — HYDROMORPHONE HCL PF 1 MG/ML IJ SOLN: 1.0000 mg | INTRAMUSCULAR | Status: DC | PRN

## 2014-01-05 MED ORDER — MIDAZOLAM HCL 2 MG/2ML IJ SOLN
INTRAMUSCULAR | Status: AC
  Filled 2014-01-05: qty 2

## 2014-01-05 MED ORDER — NEOSTIGMINE METHYLSULFATE 10 MG/10ML IV SOLN
INTRAVENOUS | Status: AC
  Filled 2014-01-05: qty 1

## 2014-01-05 MED ORDER — OXYCODONE HCL 5 MG PO TABS: 5.0000 mg | ORAL_TABLET | Freq: Once | ORAL | Status: DC | PRN

## 2014-01-05 MED ORDER — MIDAZOLAM HCL 2 MG/2ML IJ SOLN
1.0000 mg | INTRAMUSCULAR | Status: DC | PRN
  Administered 2014-01-05: 1 mg via INTRAVENOUS

## 2014-01-05 MED ORDER — POLYETHYLENE GLYCOL 3350 17 G PO PACK: 17.0000 g | PACK | Freq: Every day | ORAL | Status: DC | PRN

## 2014-01-05 MED ORDER — ONDANSETRON HCL 4 MG/2ML IJ SOLN: 4.0000 mg | Freq: Four times a day (QID) | INTRAMUSCULAR | Status: DC | PRN

## 2014-01-05 MED ORDER — DIPHENHYDRAMINE HCL 12.5 MG/5ML PO ELIX: 12.5000 mg | ORAL_SOLUTION | ORAL | Status: DC | PRN

## 2014-01-05 MED ORDER — OXYCODONE HCL 5 MG PO TABS
5.0000 mg | ORAL_TABLET | ORAL | Status: DC | PRN
  Administered 2014-01-05 – 2014-01-07 (×8): 10 mg via ORAL
  Filled 2014-01-05 (×7): qty 2

## 2014-01-05 MED ORDER — GLYCOPYRROLATE 0.2 MG/ML IJ SOLN
INTRAMUSCULAR | Status: AC
  Filled 2014-01-05: qty 3

## 2014-01-05 MED ORDER — OXYCODONE HCL 5 MG PO TABS
ORAL_TABLET | ORAL | Status: AC
  Administered 2014-01-05: 10 mg via ORAL
  Filled 2014-01-05: qty 2

## 2014-01-05 MED ORDER — NEOSTIGMINE METHYLSULFATE 10 MG/10ML IV SOLN
INTRAVENOUS | Status: DC | PRN
  Administered 2014-01-05: 5 mg via INTRAVENOUS

## 2014-01-05 MED ORDER — PROPOFOL 10 MG/ML IV BOLUS
INTRAVENOUS | Status: DC | PRN
  Administered 2014-01-05: 170 mg via INTRAVENOUS

## 2014-01-05 MED ORDER — METOCLOPRAMIDE HCL 5 MG/ML IJ SOLN: 5.0000 mg | Freq: Three times a day (TID) | INTRAMUSCULAR | Status: DC | PRN

## 2014-01-05 MED ORDER — LISINOPRIL-HYDROCHLOROTHIAZIDE 20-12.5 MG PO TABS: 1.0000 | ORAL_TABLET | Freq: Every day | ORAL | Status: DC

## 2014-01-05 MED ORDER — OXYCODONE HCL 5 MG/5ML PO SOLN
5.0000 mg | Freq: Once | ORAL | Status: DC | PRN
Start: 2014-01-05 — End: 2014-01-05

## 2014-01-05 SURGICAL SUPPLY — 68 items
BANDAGE ELASTIC 6 VELCRO ST LF (GAUZE/BANDAGES/DRESSINGS) ×3 IMPLANT
BANDAGE ESMARK 6X9 LF (GAUZE/BANDAGES/DRESSINGS) ×1 IMPLANT
BEARIN INSERT TIBIAL SZ6 9 (Orthopedic Implant) ×2 IMPLANT
BEARING INSERT TIBIAL SZ6 9 (Orthopedic Implant) IMPLANT
BLADE SAG 18X100X1.27 (BLADE) ×2 IMPLANT
BNDG CMPR 9X6 STRL LF SNTH (GAUZE/BANDAGES/DRESSINGS) ×1
BNDG ESMARK 6X9 LF (GAUZE/BANDAGES/DRESSINGS) ×2
BOWL SMART MIX CTS (DISPOSABLE) IMPLANT
CEMENT BONE SIMPLEX SPEEDSET (Cement) ×1 IMPLANT
COVER SURGICAL LIGHT HANDLE (MISCELLANEOUS) ×2 IMPLANT
CUFF TOURNIQUET SINGLE 34IN LL (TOURNIQUET CUFF) ×2 IMPLANT
CUFF TOURNIQUET SINGLE 44IN (TOURNIQUET CUFF) IMPLANT
DRAPE INCISE IOBAN 66X45 STRL (DRAPES) ×2 IMPLANT
DRAPE ORTHO SPLIT 77X108 STRL (DRAPES) ×4
DRAPE PROXIMA HALF (DRAPES) ×2 IMPLANT
DRAPE SURG ORHT 6 SPLT 77X108 (DRAPES) ×2 IMPLANT
DRAPE U-SHAPE 47X51 STRL (DRAPES) ×2 IMPLANT
DRSG PAD ABDOMINAL 8X10 ST (GAUZE/BANDAGES/DRESSINGS) ×2 IMPLANT
DURAPREP 26ML APPLICATOR (WOUND CARE) ×2 IMPLANT
ELECT REM PT RETURN 9FT ADLT (ELECTROSURGICAL) ×2
ELECTRODE REM PT RTRN 9FT ADLT (ELECTROSURGICAL) ×1 IMPLANT
EVACUATOR 1/8 PVC DRAIN (DRAIN) ×1 IMPLANT
FACESHIELD WRAPAROUND (MASK) ×6 IMPLANT
FACESHIELD WRAPAROUND OR TEAM (MASK) ×3 IMPLANT
GAUZE XEROFORM 1X8 LF (GAUZE/BANDAGES/DRESSINGS) ×2 IMPLANT
GLOVE BIO SURGEON STRL SZ8 (GLOVE) ×3 IMPLANT
GLOVE BIOGEL PI IND STRL 7.0 (GLOVE) IMPLANT
GLOVE BIOGEL PI IND STRL 8 (GLOVE) ×1 IMPLANT
GLOVE BIOGEL PI IND STRL 8.5 (GLOVE) IMPLANT
GLOVE BIOGEL PI INDICATOR 7.0 (GLOVE) ×1
GLOVE BIOGEL PI INDICATOR 8 (GLOVE) ×1
GLOVE BIOGEL PI INDICATOR 8.5 (GLOVE) ×1
GLOVE ECLIPSE 7.0 STRL STRAW (GLOVE) ×2 IMPLANT
GLOVE ORTHO TXT STRL SZ7.5 (GLOVE) ×2 IMPLANT
GLOVE SURG SS PI 8.0 STRL IVOR (GLOVE) ×2 IMPLANT
GOWN STRL REUS W/ TWL LRG LVL3 (GOWN DISPOSABLE) ×2 IMPLANT
GOWN STRL REUS W/ TWL XL LVL3 (GOWN DISPOSABLE) ×4 IMPLANT
GOWN STRL REUS W/TWL LRG LVL3 (GOWN DISPOSABLE) ×4
GOWN STRL REUS W/TWL XL LVL3 (GOWN DISPOSABLE) ×4
HANDPIECE INTERPULSE COAX TIP (DISPOSABLE) ×2
IMMOBILIZER KNEE 22 UNIV (SOFTGOODS) ×1 IMPLANT
KIT BASIN OR (CUSTOM PROCEDURE TRAY) ×2 IMPLANT
KIT ROOM TURNOVER OR (KITS) ×2 IMPLANT
KNEE/VIT E POLY LINER LEVEL 1B ×1 IMPLANT
MANIFOLD NEPTUNE II (INSTRUMENTS) ×2 IMPLANT
NS IRRIG 1000ML POUR BTL (IV SOLUTION) ×1 IMPLANT
PACK TOTAL JOINT (CUSTOM PROCEDURE TRAY) ×2 IMPLANT
PAD ARMBOARD 7.5X6 YLW CONV (MISCELLANEOUS) ×4 IMPLANT
PADDING CAST COTTON 6X4 STRL (CAST SUPPLIES) ×2 IMPLANT
PATELLA A38 (Orthopedic Implant) IMPLANT
SET HNDPC FAN SPRY TIP SCT (DISPOSABLE) IMPLANT
SET PAD KNEE POSITIONER (MISCELLANEOUS) ×2 IMPLANT
SPONGE GAUZE 4X4 12PLY (GAUZE/BANDAGES/DRESSINGS) ×2 IMPLANT
SPONGE GAUZE 4X4 12PLY STER LF (GAUZE/BANDAGES/DRESSINGS) ×1 IMPLANT
STAPLER VISISTAT 35W (STAPLE) ×1 IMPLANT
STRIP CLOSURE SKIN 1/2X4 (GAUZE/BANDAGES/DRESSINGS) ×4 IMPLANT
SUCTION FRAZIER TIP 10 FR DISP (SUCTIONS) ×2 IMPLANT
SUT VIC AB 1 CT1 27 (SUTURE) ×4
SUT VIC AB 1 CT1 27XBRD ANBCTR (SUTURE) ×2 IMPLANT
SUT VIC AB 2-0 CT1 27 (SUTURE) ×4
SUT VIC AB 2-0 CT1 TAPERPNT 27 (SUTURE) ×2 IMPLANT
SUT VIC AB 4-0 PS2 27 (SUTURE) ×2 IMPLANT
TOWEL OR 17X24 6PK STRL BLUE (TOWEL DISPOSABLE) ×2 IMPLANT
TOWEL OR 17X26 10 PK STRL BLUE (TOWEL DISPOSABLE) ×2 IMPLANT
TRAY FOLEY CATH 16FRSI W/METER (SET/KITS/TRAYS/PACK) ×1 IMPLANT
WATER STERILE IRR 1000ML POUR (IV SOLUTION) ×4 IMPLANT
WRAP KNEE MAXI GEL POST OP (GAUZE/BANDAGES/DRESSINGS) ×2 IMPLANT
YANKAUER SUCT BULB TIP NO VENT (SUCTIONS) ×2 IMPLANT

## 2014-01-05 NOTE — Anesthesia Procedure Notes (Addendum)
Anesthesia Regional Block:  Femoral nerve block  Pre-Anesthetic Checklist: ,, timeout performed, Correct Patient, Correct Site, Correct Laterality, Correct Procedure,, site marked, risks and benefits discussed, Surgical consent,  Pre-op evaluation,  At surgeon's request and post-op pain management  Laterality: Left  Prep: chloraprep       Needles:  Injection technique: Single-shot  Needle Type: Echogenic Stimulator Needle     Needle Length: 9cm 9 cm Needle Gauge: 21 and 21 G    Additional Needles:  Procedures: nerve stimulator Femoral nerve block  Nerve Stimulator or Paresthesia:  Response: Quadriceps muscle contraction, 0.45 mA,   Additional Responses:   Narrative:  Start time: 01/05/2014 11:15 AM End time: 01/05/2014 11:31 AM Injection made incrementally with aspirations every 5 mL.  Performed by: Personally  Anesthesiologist: Dr Marcie Bal  Additional Notes: Functioning IV was confirmed and monitors were applied.  A 59mm 21ga Arrow echogenic stimulator needle was used. Sterile prep and drape,hand hygiene and sterile gloves were used.  Negative aspiration and negative test dose prior to incremental administration of local anesthetic. The patient tolerated the procedure well.     Procedure Name: Intubation Date/Time: 01/05/2014 1:23 PM Performed by: Jacquiline Doe A Pre-anesthesia Checklist: Patient identified, Timeout performed, Emergency Drugs available, Suction available and Patient being monitored Patient Re-evaluated:Patient Re-evaluated prior to inductionOxygen Delivery Method: Circle system utilized Preoxygenation: Pre-oxygenation with 100% oxygen Intubation Type: IV induction and Cricoid Pressure applied Ventilation: Mask ventilation without difficulty and Oral airway inserted - appropriate to patient size Laryngoscope Size: Mac and 4 Grade View: Grade I Tube type: Oral Tube size: 8.0 mm Number of attempts: 1 Airway Equipment and Method: Stylet Placement  Confirmation: ETT inserted through vocal cords under direct vision,  breath sounds checked- equal and bilateral and positive ETCO2 Secured at: 22 cm Tube secured with: Tape Dental Injury: Teeth and Oropharynx as per pre-operative assessment

## 2014-01-05 NOTE — Anesthesia Postprocedure Evaluation (Signed)
Anesthesia Post Note  Patient: [name]  Procedure(s) Performed: Procedure(s) (LRB): LEFT TOTAL KNEE ARTHROPLASTY (Left)  Anesthesia type: General  Patient location: PACU  Post pain: Pain level controlled and Adequate analgesia  Post assessment: Post-op Vital signs reviewed, Patient's Cardiovascular Status Stable, Respiratory Function Stable, Patent Airway and Pain level controlled  Last Vitals:  Filed Vitals:   01/05/14 1615  BP: 118/73  Pulse: 67  Temp:   Resp: 11    Post vital signs: Reviewed and stable  Level of consciousness: awake, alert  and oriented  Complications: No apparent anesthesia complications

## 2014-01-05 NOTE — H&P (Signed)
TOTAL KNEE ADMISSION H&P  Patient is being admitted for left total knee arthroplasty.  Subjective:  Chief Complaint:left knee pain.  HPI: [name], 73 y.o. male, has a history of pain and functional disability in the left knee due to arthritis and has failed non-surgical conservative treatments for greater than 12 weeks to includeNSAID's and/or analgesics, corticosteriod injections, flexibility and strengthening excercises, use of assistive devices, weight reduction as appropriate and activity modification.  Onset of symptoms was gradual, starting 3 years ago with stable course since that time. The patient noted no past surgery on the left knee(s).  Patient currently rates pain in the left knee(s) at 10 out of 10 with activity. Patient has night pain, worsening of pain with activity and weight bearing, pain that interferes with activities of daily living, pain with passive range of motion, crepitus and joint swelling.  Patient has evidence of subchondral sclerosis, periarticular osteophytes and joint space narrowing by imaging studies. There is no active infection.  Patient Active Problem List   Diagnosis Date Noted  . Arthritis of knee, left 01/05/2014  . Barrett's esophagus 09/01/2012  . Chest discomfort + dyspnea 09/01/2012  . Hypertension   . Hyperlipidemia   . Hypothyroidism   . Bradycardia   . COLONIC POLYPS, ADENOMATOUS 04/12/2009  . GASTROESOPHAGEAL REFLUX DISEASE, CHRONIC 04/11/2009   Past Medical History  Diagnosis Date  . Neuropathy 1994  . Hypertension   . Hyperlipidemia   . Gout   . Hypothyroidism     Following partial thyroidectomy  . GERD (gastroesophageal reflux disease)   . Hx of adenomatous colonic polyps   . Barrett's esophagus   . Hiatal hernia   . Bradycardia 2009    Sinus rhythm in the 40s without symptoms;  . 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 Arthritis   . Cancer     skin cancer benign arms and right ear and  rightside lip    Past Surgical History  Procedure Laterality Date  . Thyroidectomy, partial  1969    Right lobe-nodule present  . Abdominal hernia repair    . Esophagogastroduodenoscopy  01/2006    geographic erosive RE, noncritical peptic stricture s/p dilation, antral erosion  . Colonoscopy  12/2005    normal  . Colonoscopy  2004    tubulovillous adenoma of rectum   . Esophagogastroduodenoscopy  01/12/2011    Dr. Gala Romney- hiatal hernia, distal esophageal erosions, barretts esophagus  . Maloney dilation  01/12/2011    Procedure: [name] DILATION;  Surgeon: Daneil Dolin, MD;  Location: [facility];  Service: Endoscopy;  Laterality: N/A;  56 french  . Colonoscopy  01/12/2011    Dr. Gala Romney- normal rectum  . Esophagogastroduodenoscopy  01/27/2012    Procedure: ESOPHAGOGASTRODUODENOSCOPY (EGD);  Surgeon: Daneil Dolin, MD;  Location: [facility];  Service: Endoscopy;  Laterality: N/A;  12:30  . Eye surgery Bilateral     cataracts  . Hernia repair      No prescriptions prior to admission   No Known Allergies  History  Substance Use Topics  . Smoking status: Former Smoker -- 1.00 packs/day for 20 years    Types: Cigarettes  . Smokeless tobacco: Never Used  . Alcohol Use: Yes     Comment: a beer every now again    Family History  Problem Relation Age of Onset  . Colon cancer Neg Hx   . Liver disease Neg Hx   . Lung cancer  Father 36  . Colon polyps Brother      Review of Systems  Musculoskeletal: Positive for joint pain.  All other systems reviewed and are negative.   Objective:  Physical Exam  Constitutional: He is oriented to person, place, and time. He appears well-developed and well-nourished.  HENT:  Head: Normocephalic and atraumatic.  Eyes: EOM are normal. Pupils are equal, round, and reactive to light.  Neck: Normal range of motion. Neck supple.  Cardiovascular: Normal rate and regular rhythm.   Respiratory: Effort normal and breath sounds normal.  GI: Soft.  Bowel sounds are normal.  Musculoskeletal:       Left knee: He exhibits decreased range of motion, effusion and abnormal alignment. Tenderness found. Medial joint line and lateral joint line tenderness noted.  Neurological: He is alert and oriented to person, place, and time.  Skin: Skin is warm and dry.  Psychiatric: He has a normal mood and affect.    Vital signs in last 24 hours:    Labs:   Estimated body mass index is 36.61 kg/(m^2) as calculated from the following:   Height as of 10/12/12: 5\' 9"  (1.753 m).   Weight as of 10/12/12: 112.492 kg (248 lb).   Imaging Review Plain radiographs demonstrate severe degenerative joint disease of the left knee(s). The overall alignment ismild varus. The bone quality appears to be good for age and reported activity level.  Assessment/Plan:  End stage arthritis, left knee   The patient history, physical examination, clinical judgment of the provider and imaging studies are consistent with end stage degenerative joint disease of the left knee(s) and total knee arthroplasty is deemed medically necessary. The treatment options including medical management, injection therapy arthroscopy and arthroplasty were discussed at length. The risks and benefits of total knee arthroplasty were presented and reviewed. The risks due to aseptic loosening, infection, stiffness, patella tracking problems, thromboembolic complications and other imponderables were discussed. The patient acknowledged the explanation, agreed to proceed with the plan and consent was signed. Patient is being admitted for inpatient treatment for surgery, pain control, PT, OT, prophylactic antibiotics, VTE prophylaxis, progressive ambulation and ADL's and discharge planning. The patient is planning to be discharged home with home health services

## 2014-01-05 NOTE — Brief Op Note (Signed)
01/05/2014  3:36 PM  PATIENT:  Cottie Banda Corcoran  73 y.o. male  PRE-OPERATIVE DIAGNOSIS:  Severe osteoarthritis left knee  POST-OPERATIVE DIAGNOSIS:  Severe osteoarthritis left knee  PROCEDURE:  Procedure(s): LEFT TOTAL KNEE ARTHROPLASTY (Left)  SURGEON:  Surgeon(s) and Role:    * Mcarthur Rossetti, MD - Primary  PHYSICIAN ASSISTANT:   ASSISTANTS: none   ANESTHESIA:  Regional and general  EBL:  Total I/O In: 1450 [I.V.:1450] Out: 675 [Urine:475; Blood:200]  BLOOD ADMINISTERED:none  DRAINS: (medium) Hemovact drain(s) in the knee with  Suction Open   LOCAL MEDICATIONS USED:  NONE  COUNTS:  YES  TOURNIQUET:   Total Tourniquet Time Documented: Thigh (Left) - 76 minutes Total: Thigh (Left) - 76 minutes   DICTATION: .Other Dictation: Dictation Number [id number]  PLAN OF CARE: Admit to inpatient   PATIENT DISPOSITION:  PACU - hemodynamically stable.   Delay start of Pharmacological VTE agent (>24hrs) due to surgical blood loss or risk of bleeding: no

## 2014-01-05 NOTE — Anesthesia Preprocedure Evaluation (Signed)
Anesthesia Evaluation  Patient identified by MRN, date of birth, ID band Patient awake    Reviewed: Allergy & Precautions, H&P , NPO status , Patient's Chart, lab work & pertinent test results  Airway Mallampati: II  Neck ROM: full    Dental   Pulmonary former smoker,          Cardiovascular hypertension,     Neuro/Psych  Neuromuscular disease    GI/Hepatic hiatal hernia, GERD-  ,  Endo/Other  Hypothyroidism obese  Renal/GU      Musculoskeletal  (+) Arthritis -,   Abdominal   Peds  Hematology   Anesthesia Other Findings   Reproductive/Obstetrics                           Anesthesia Physical Anesthesia Plan  ASA: II  Anesthesia Plan: General and Regional   Post-op Pain Management: MAC Combined w/ Regional for Post-op pain   Induction: Intravenous  Airway Management Planned: Oral ETT  Additional Equipment:   Intra-op Plan:   Post-operative Plan: Extubation in OR  Informed Consent: I have reviewed the patients History and Physical, chart, labs and discussed the procedure including the risks, benefits and alternatives for the proposed anesthesia with the patient or authorized representative who has indicated his/her understanding and acceptance.     Plan Discussed with: CRNA, Anesthesiologist and Surgeon  Anesthesia Plan Comments:         Anesthesia Quick Evaluation

## 2014-01-05 NOTE — Transfer of Care (Signed)
Immediate Anesthesia Transfer of Care Note  Patient: [name]  Procedure(s) Performed: Procedure(s): LEFT TOTAL KNEE ARTHROPLASTY (Left)  Patient Location: PACU  Anesthesia Type:General and Regional  Level of Consciousness: awake and alert   Airway & Oxygen Therapy: Patient Spontanous Breathing and Patient connected to nasal cannula oxygen  Post-op Assessment: Report given to PACU RN, Post -op Vital signs reviewed and stable and Patient moving all extremities X 4  Post vital signs: Reviewed and stable  Complications: No apparent anesthesia complications

## 2014-01-06 LAB — BASIC METABOLIC PANEL
Anion gap: 15 (ref 5–15)
BUN: 16 mg/dL (ref 6–23)
CO2: 23 mEq/L (ref 19–32)
Calcium: 8.7 mg/dL (ref 8.4–10.5)
Chloride: 99 mEq/L (ref 96–112)
Creatinine, Ser: 1.15 mg/dL (ref 0.50–1.35)
GFR calc Af Amer: 72 mL/min — ABNORMAL LOW (ref >90)
GFR calc non Af Amer: 62 mL/min — ABNORMAL LOW (ref >90)
Glucose, Bld: 157 mg/dL — ABNORMAL HIGH (ref 70–99)
Potassium: 4.5 mEq/L (ref 3.7–5.3)
Sodium: 137 mEq/L (ref 137–147)

## 2014-01-06 LAB — CBC
HCT: 32.3 % — ABNORMAL LOW (ref 39.0–52.0)
Hemoglobin: 10.6 g/dL — ABNORMAL LOW (ref 13.0–17.0)
MCH: 29.6 pg (ref 26.0–34.0)
MCHC: 32.8 g/dL (ref 30.0–36.0)
MCV: 90.2 fL (ref 78.0–100.0)
Platelets: 156 10*3/uL (ref 150–400)
RBC: 3.58 MIL/uL — ABNORMAL LOW (ref 4.22–5.81)
RDW: 12.9 % (ref 11.5–15.5)
WBC: 12 10*3/uL — ABNORMAL HIGH (ref 4.0–10.5)

## 2014-01-06 MED ORDER — ASPIRIN 325 MG PO TBEC: 325.0000 mg | DELAYED_RELEASE_TABLET | Freq: Two times a day (BID) | ORAL | Status: DC

## 2014-01-06 MED ORDER — OXYCODONE-ACETAMINOPHEN 5-325 MG PO TABS: 1.0000 | ORAL_TABLET | ORAL | Status: DC | PRN

## 2014-01-06 NOTE — Discharge Instructions (Signed)
Pickup stool softener for constipation. Weight Bearing as tolerated  Progress activities slowly Expect knee soreness and swelling. Apply heat or ice as needed. Keep dressing clean dry and intact, may shower with dressing intact. On Friday remove dressing, incision can get wet. After showering apply clean dressing.

## 2014-01-06 NOTE — Progress Notes (Signed)
Physical Therapy Treatment Patient Details Name: [name] MRN: [id number] DOB: 06-22-1941 Today's Date: 01/06/2014    History of Present Illness s/p Lt TKA 01/05/14    PT Comments    Pt continues to steadily increase mobility with therapy. Pt demo slight buckling of Lt knee intermittently with mobilization. Hopes to D/C tomorrow. Patient needs to practice stairs next session prior to D/C.   Follow Up Recommendations  Home health PT;Supervision/Assistance - 24 hour     Equipment Recommendations  None recommended by PT    Recommendations for Other Services OT consult     Precautions / Restrictions Precautions Precautions: Knee Precaution Comments: pt given TKA handout and reinforced no pillow under knee Required Braces or Orthoses: Knee Immobilizer - Left Knee Immobilizer - Left: On when out of bed or walking Restrictions Weight Bearing Restrictions: No LLE Weight Bearing: Weight bearing as tolerated    Mobility  Bed Mobility Overal bed mobility: Modified Independent Bed Mobility: Sit to Supine       Sit to supine: Modified independent (Device/Increase time)   General bed mobility comments: no physical (A) required to advance Lt LE into supine position   Transfers Overall transfer level: Needs assistance Equipment used: Rolling walker (2 wheeled) Transfers: Sit to/from Stand Sit to Stand: Supervision         General transfer comment: supervision for cues for hand placement and safety with RW   Ambulation/Gait Ambulation/Gait assistance: Min guard Ambulation Distance (Feet): 250 Feet Assistive device: Rolling walker (2 wheeled) Gait Pattern/deviations: Decreased stance time - left;Decreased step length - right;Antalgic;Trunk flexed Gait velocity: decreased Gait velocity interpretation: Below normal speed for age/gender General Gait Details: pt with Lt knee buckling intermittently; relied heavily on UEs for support; cues for upright posture and step through  gt    Stairs            Wheelchair Mobility    Modified Rankin (Stroke Patients Only)       Balance Overall balance assessment: Needs assistance Sitting-balance support: Feet supported;No upper extremity supported Sitting balance-Leahy Scale: Good     Standing balance support: During functional activity;Bilateral upper extremity supported Standing balance-Leahy Scale: Fair Standing balance comment: able to take UEs off RW for minimal amount of time for RW adjustment                     Cognition Arousal/Alertness: Awake/alert Behavior During Therapy: WFL for tasks assessed/performed Overall Cognitive Status: Within Functional Limits for tasks assessed                      Exercises Total Joint Exercises Ankle Circles/Pumps: AROM;Strengthening;Both;10 reps;Seated Short Arc Quad: AAROM;Left;Strengthening;10 reps;Supine Heel Slides: AAROM;Left;10 reps;Seated Hip ABduction/ADduction: AROM;Left;Strengthening;10 reps;Supine Goniometric ROM: tolerating 68 degrees knee flexion in CPM    General Comments        Pertinent Vitals/Pain 1/10    Home Living Family/patient expects to be discharged to:: Private residence                    Prior Function            PT Goals (current goals can now be found in the care plan section) Acute Rehab PT Goals Patient Stated Goal: to go home tomorrow PT Goal Formulation: With patient Time For Goal Achievement: 01/09/14 Potential to Achieve Goals: Good Progress towards PT goals: Progressing toward goals    Frequency  7X/week    PT Plan Current plan remains appropriate  Co-evaluation             End of Session Equipment Utilized During Treatment: Gait belt;Left knee immobilizer Activity Tolerance: Patient tolerated treatment well Patient left: in bed;with call bell/phone within reach;in CPM     Time: 1241-1306 PT Time Calculation (min): 25 min  Charges:  $Gait Training: 8-22  mins $Therapeutic Exercise: 8-22 mins                    G CodesElie Confer Foley, [place] 01/06/2014, 1:13 PM

## 2014-01-06 NOTE — Evaluation (Signed)
Physical Therapy Evaluation Patient Details Name: [name] MRN: [id number] DOB: 1941/06/25 Today's Date: 01/06/2014   History of Present Illness  s/p Lt TKA 01/05/14  Clinical Impression  Pt is s/p Lt TKA POD#1 resulting in the deficits listed below (see PT Problem List). Pt will benefit from skilled PT to increase their independence and safety with mobility to allow discharge to the venue listed below. Pt very motivated to return to his PLOF and has great family support. Anticipate good rehab prognosis for home D/C.      Follow Up Recommendations Home health PT;Supervision/Assistance - 24 hour    Equipment Recommendations  None recommended by PT    Recommendations for Other Services OT consult     Precautions / Restrictions Precautions Precautions: Fall;Knee Precaution Comments: educated on no pillow under knee  Required Braces or Orthoses: Knee Immobilizer - Left Restrictions Weight Bearing Restrictions: Yes LLE Weight Bearing: Weight bearing as tolerated      Mobility  Bed Mobility Overal bed mobility: Needs Assistance Bed Mobility: Supine to Sit     Supine to sit: Supervision;HOB elevated     General bed mobility comments: cues for hand placement and sequencing; effortful for pt; no physical (A) needed  Transfers Overall transfer level: Needs assistance Equipment used: Rolling walker (2 wheeled) Transfers: Sit to/from Stand Sit to Stand: Min assist         General transfer comment: initial difficulty sequencing transfer; required min (A) to elevate trunk due to decr ability to WB through Lt LE; cues for hand placement and sequencing   Ambulation/Gait Ambulation/Gait assistance: Min guard Ambulation Distance (Feet): 60 Feet Assistive device: Rolling walker (2 wheeled) Gait Pattern/deviations: Step-through pattern;Antalgic;Decreased stance time - left;Decreased step length - right;Trunk flexed Gait velocity: decreased Gait velocity interpretation: Below  normal speed for age/gender General Gait Details: cues for upright posture and gt sequencing to equalize step length; min guard to steady and manage RW   Stairs            Wheelchair Mobility    Modified Rankin (Stroke Patients Only)       Balance Overall balance assessment: Needs assistance Sitting-balance support: Feet supported;No upper extremity supported Sitting balance-Leahy Scale: Good Sitting balance - Comments: sat EOB ~5 min and no c/o dizziness   Standing balance support: During functional activity;Bilateral upper extremity supported Standing balance-Leahy Scale: Poor Standing balance comment: relies heavily on RW for UE support                             Pertinent Vitals/Pain 2/10; patient repositioned for comfort     Home Living Family/patient expects to be discharged to:: Private residence Living Arrangements: Spouse/significant other Available Help at Discharge: Friend(s);Available 24 hours/day Type of Home: House Home Access: Stairs to enter   [employer] of Steps: 1 Home Layout: One level Home Equipment: Walker - 2 wheels;Bedside commode;Shower seat Additional Comments: wife can be with pt 24/7 x 1 week, then has to return to work; pt has tub shower    Prior Function Level of Independence: Independent               Hand Dominance        Extremity/Trunk Assessment   Upper Extremity Assessment: Defer to OT evaluation           Lower Extremity Assessment: LLE deficits/detail   LLE Deficits / Details: AROM in sitting -5 to 60 degrees; strength in knee 2+/5  Cervical / Trunk Assessment: Normal  Communication   Communication: No difficulties  Cognition Arousal/Alertness: Awake/alert Behavior During Therapy: WFL for tasks assessed/performed Overall Cognitive Status: Within Functional Limits for tasks assessed                      General Comments      Exercises Total Joint Exercises Ankle  Circles/Pumps: AROM;Strengthening;Both;10 reps;Supine Quad Sets: AROM;Strengthening;Left;10 reps;Seated Heel Slides: AAROM;Left;10 reps;Seated Goniometric ROM: see Lt LE assessment       Assessment/Plan    PT Assessment Patient needs continued PT services  PT Diagnosis Abnormality of gait;Generalized weakness;Acute pain   PT Problem List Decreased strength;Decreased range of motion;Decreased activity tolerance;Decreased balance;Decreased mobility;Decreased knowledge of use of DME;Pain  PT Treatment Interventions DME instruction;Gait training;Stair training;Functional mobility training;Therapeutic activities;Therapeutic exercise;Balance training;Neuromuscular re-education;Patient/family education   PT Goals (Current goals can be found in the Care Plan section) Acute Rehab PT Goals Patient Stated Goal: to get the most rehab i can PT Goal Formulation: With patient Time For Goal Achievement: 01/09/14 Potential to Achieve Goals: Good    Frequency 7X/week   Barriers to discharge        Co-evaluation               End of Session Equipment Utilized During Treatment: Gait belt;Left knee immobilizer Activity Tolerance: Patient tolerated treatment well Patient left: in chair;with call bell/phone within reach;with family/visitor present Nurse Communication: Mobility status;Precautions;Weight bearing status         Time: 5284-1324 PT Time Calculation (min): 27 min   Charges:   PT Evaluation $Initial PT Evaluation Tier I: 1 Procedure PT Treatments $Gait Training: 8-22 mins   PT G CodesGustavus Foley, [place] 01/06/2014, 8:21 AM

## 2014-01-06 NOTE — Progress Notes (Signed)
Subjective: 1 Day Post-Op Procedure(s) (LRB): LEFT TOTAL KNEE ARTHROPLASTY (Left) Patient reports pain as mild.   No Complaints . Doing well with PT. Objective: Vital signs in last 24 hours: Temp:  [97.3 F (36.3 C)-98.2 F (36.8 C)] 97.6 F (36.4 C) (07/04 0655) Pulse Rate:  [50-80] 73 (07/04 0655) Resp:  [11-37] 18 (07/04 0655) BP: (107-175)/(61-88) 116/65 mmHg (07/04 0655) SpO2:  [93 %-100 %] 98 % (07/04 0655) Weight:  [105.235 kg (232 lb)] 105.235 kg (232 lb) (07/03 1013)  Intake/Output from previous day: 07/03 0701 - 07/04 0700 In: 2880 [P.O.:360; I.V.:2520] Out: 3700 [Urine:3075; Drains:425; Blood:200] Intake/Output this shift: Total I/O In: -  Out: 300 [Drains:300]   Recent Labs  01/06/14 0431  HGB 10.6*    Recent Labs  01/06/14 0431  WBC 12.0*  RBC 3.58*  HCT 32.3*  PLT 156    Recent Labs  01/06/14 0431  NA 137  K 4.5  CL 99  CO2 23  BUN 16  CREATININE 1.15  GLUCOSE 157*  CALCIUM 8.7   No results found for this basename: LABPT, INR,  in the last 72 hours  Neurovascular intact Sensation intact distally Intact pulses distally Dorsiflexion/Plantar flexion intact Incision: dressing C/D/I Compartment soft  Assessment/Plan: 1 Day Post-Op Procedure(s) (LRB): LEFT TOTAL KNEE ARTHROPLASTY (Left) Up with therapy Discharge home with home health probably tomorrow. Monitor for symptoms of anemia  [name] 01/06/2014, 8:25 AM

## 2014-01-06 NOTE — Care Management Note (Signed)
CARE MANAGEMENT NOTE 01/06/2014  Patient:  BARTLOMIEJ, JENKINSON   Account Number:  [id number]  Date Initiated:  01/06/2014  Documentation initiated by:  Ricki Miller  Subjective/Objective Assessment:   73 yr old male s/p Left total knee arthroplasty.     Action/Plan:   Case manager spoke with patient concerning home health and DME needs. Case manager explained to patient that she will contact him on Monday after contacting [facility] in [place]. Patient has family support.   Anticipated DC Date:  01/07/2014   Anticipated DC Plan:  [place]  CM consult      [facility] Choice  HOME HEALTH  DURABLE MEDICAL EQUIPMENT   Choice offered to / List presented to:  C-1 Patient   DME arranged  CPM      DME agency  [employer]     HH arranged  HH-2 PT      Status of service:  In process, will continue to follow Medicare Important Message given?   (If response is "NO", the following Medicare IM given date fields will be blank) Date Medicare IM given:   Medicare IM given by:   Date Additional Medicare IM given:   Additional Medicare IM given by:    Discharge Disposition:    Per UR Regulation:  Reviewed for med. necessity/level of care/duration of stay

## 2014-01-06 NOTE — Evaluation (Signed)
Occupational Therapy Evaluation and Discharge Patient Details Name: AQIB LOUGH MRN: [id number] DOB: [birth year] Today's Date: 01/06/2014    History of Present Illness s/p Lt TKA 01/05/14   Clinical Impression   This 73 yo male admitted and underwent above presents to acute OT with all education complete. Will D/C from acute OT.    Follow Up Recommendations  No OT follow up    Equipment Recommendations  None recommended by OT       Precautions / Restrictions Precautions Precautions: Fall;Knee Precaution Comments: educated on no pillow under knee  Required Braces or Orthoses: Knee Immobilizer - Left Knee Immobilizer - Left: On when out of bed or walking Restrictions Weight Bearing Restrictions: No LLE Weight Bearing: Weight bearing as tolerated      Mobility Bed Mobility     General bed mobility comments: Pt up in recliner upon my arrival  Transfers Overall transfer level: Needs assistance Equipment used: Rolling walker (2 wheeled) Transfers: Sit to/from Stand Sit to Stand: Min guard         General transfer comment: initial difficulty sequencing transfer; required min (A) to elevate trunk due to decr ability to WB through Lt LE; cues for hand placement and sequencing                          Pertinent Vitals/Pain 1-2/10 LLE     Hand Dominance Right   Extremity/Trunk Assessment Upper Extremity Assessment Upper Extremity Assessment: Overall WFL for tasks assessed      Communication Communication Communication: No difficulties   Cognition Arousal/Alertness: Awake/alert Behavior During Therapy: WFL for tasks assessed/performed Overall Cognitive Status: Within Functional Limits for tasks assessed                             Home Living Family/patient expects to be discharged to:: Private residence Living Arrangements: Spouse/significant other Available Help at Discharge: Friend(s);Available 24 hours/day Type of Home: House Home  Access: Stairs to enter [employer] of Steps: 1   Home Layout: One level     Bathroom Shower/Tub: Tub/shower unit;Curtain Shower/tub characteristics: Architectural technologist: Standard     Home Equipment: Environmental consultant - 2 wheels;Bedside commode;Shower seat   Additional Comments: wife can be with pt 24/7 x 1 week, then has to return to work; pt has tub shower      Prior Functioning/Environment Level of Independence: Independent                      OT Goals(Current goals can be found in the care plan section) Acute Rehab OT Goals Patient Stated Goal: to go home tomorrow  OT Frequency:                End of Session Equipment Utilized During Treatment: Rolling walker;Gait belt;Left knee immobilizer  Activity Tolerance: Patient tolerated treatment well Patient left: in chair;with call bell/phone within reach   Time: 1033-1110 OT Time Calculation (min): 37 min Charges:  OT General Charges $OT Visit: 1 Procedure OT Evaluation $Initial OT Evaluation Tier I: 1 Procedure OT Treatments $Self Care/Home Management : 23-37 mins  Almon Register [phone] 01/06/2014, 12:01 PM

## 2014-01-06 NOTE — Op Note (Signed)
NAMEVITALY, [name] NO.:  [id number]  MEDICAL RECORD NO.:  [id number]  LOCATION:  [id number]                        FACILITY:  [facility]  PHYSICIAN:  Lind Guest. Ninfa Linden, M.D.DATE OF BIRTH:  1941-02-10  DATE OF PROCEDURE:  01/05/2014 DATE OF DISCHARGE:                              OPERATIVE REPORT   PREOPERATIVE DIAGNOSES:  End-stage osteoarthritis and degenerative joint disease, left knee.  POSTOPERATIVE DIAGNOSES:  End-stage osteoarthritis and degenerative joint disease, left knee.  PROCEDURE:  Left total knee arthroplasty.  IMPLANTS:  Stryker Triathlon knee with size 6 femur, size 6 tibial tray, 9-mm polyethylene insert, and size 38 patellar button.  SURGEON:  Lind Guest. Ninfa Linden, M.D.  ASSISTANT:  Shary Decamp, physician assistant II.  ANESTHESIA: 1. Leg femoral nerve block. 2. General.  ANTIBIOTICS:  2 g of IV Ancef.  BLOOD LOSS:  200 mL.  TOURNIQUET TIME:  Under 2 hours.  COMPLICATIONS:  None.  INDICATIONS:  [name] is a 73 year old gentleman well known to me. He has had multiple episodes of recurrent synovitis involving his left knee and we have drained this on multiple occasions.  He has tried steroid injections.  He has tried hyaluronic acid injection, and has gotten worse infection quite significantly.  He has significant medial joint space narrowing.  There is patellofemoral arthritic changes as well and a varus deformity of his knee.  At this point with the failure of conservative treatment, he wishes for total knee replacement.  He hopes this helps with his mobility, decrease his pain and improve his quality of life.  PROCEDURE DESCRIPTION:  After informed consent was obtained, appropriate left knee was marked.  Anesthesia was obtained through femoral nerve block.  He was then brought to the operating room and placed supine on the operating table.  General anesthesia was then obtained.  A Foley catheter was placed and then a  nonsterile tourniquet was placed on his upper left leg.  His left leg was then prepped and draped with DuraPrep and sterile drapes.  A time-out was called and he was identified as correct patient and correct left leg.  We then used an Esmarch to wrap out the leg and the tourniquet was inflated to 300 mm of pressure.  I made a midline incision over the knee, and carried this proximally and distally.  We dissected down the knee joint and performed a medial parapatellar arthrotomy.  We found a large joint effusion and significant angry red synovium throughout the knee.  We inverted the patella and was able to remove the osteophytes from the patella and remnants of the fat pad.  We put the knee in a flexed position and removed remnants of the ACL, PCL, medial and lateral meniscus.  Using the extramedullary guide, we then made our tibial cut, correcting for varus, valgus and neutral slope.  We took 9 mm off the high side.  We then went to the femur and used an intramedullary guide to the femoral notch.  We made our distal femoral cut off of the left cutting block at 5 degrees and externally rotated, and took 10 mm off the distal femur. We brought the knee back down to extension and we  were pleased with what the 9-mm extension block did for him.  We went back to flexion again and then placed a femoral sizing guide based off the epicondylar axis and chose a size 6 femur.  We then placed our 4-in-1 cutting block and made our anterior and posterior cuts followed by our chamfer cuts.  We then made our femoral box cut.  We then went back to the tibia and we trialed for a size 6 tibia, and I liked how that covered the plateau and so we made our keel punch for this with the trial size 6 tibia and we placed trial size 6 femur and a 9-mm polyethylene insert.  We were pleased with the alignment and range of motion with this and the stability.  We then made our patellar cut and drilled holes for a size 38  patellar button. After this, we opened up the real implants.  We copiously irrigated the knee with normal saline solution using pulsatile lavage and removed any debris posteriorly.  We then brought the knee back up in a flexed position and we were able to cement the real Stryker Triathlon tibial tray, size 6 and the real size 6 femur.  We cleaned the cement debris from the knee and placed the real 9-mm polyethylene insert.  We had trouble getting this place.  We then brought the patella down and we tried to cement the patella in place after drilling holes for the patella lug.  We had a tough time doing this and the cement had hardened, so we ended up need to get another patella and the new batch of cement.  Once the cement had dried on the femoral and tibial components and brought the knee back up, I removed the original 9-mm polyethylene insert and got a new polyethylene and placed a 9-mm fixed- bearing insert without difficulty the second time.  We then rolled the knee back down and mixed new cement.  We cemented a new 38 patellar button.  Once the cement had dried, we let the tourniquet down and hemostasis was obtained with electrocautery.  We copiously irrigated the knee with normal saline solution, then placed a medium Hemovac in the arthrotomy.  We closed the arthrotomy with interrupted #1 Vicryl suture followed by 0 Vicryl in the deep tissue, 2-0 Vicryl in the subcutaneous tissue, and staples on the skin.  Xeroform and well-padded sterile dressing were applied.  He was awakened, extubated, and taken to the recovery room in stable condition.     Lind Guest. Ninfa Linden, M.D.     CYB/MEDQ  D:  01/05/2014  T:  01/06/2014  Job:  [id number]

## 2014-01-07 LAB — CBC
HCT: 30.4 % — ABNORMAL LOW (ref 39.0–52.0)
Hemoglobin: 9.8 g/dL — ABNORMAL LOW (ref 13.0–17.0)
MCH: 29.4 pg (ref 26.0–34.0)
MCHC: 32.2 g/dL (ref 30.0–36.0)
MCV: 91.3 fL (ref 78.0–100.0)
Platelets: 150 10*3/uL (ref 150–400)
RBC: 3.33 MIL/uL — ABNORMAL LOW (ref 4.22–5.81)
RDW: 13.3 % (ref 11.5–15.5)
WBC: 10.9 10*3/uL — ABNORMAL HIGH (ref 4.0–10.5)

## 2014-01-07 MED ORDER — DSS 100 MG PO CAPS: 100.0000 mg | ORAL_CAPSULE | Freq: Two times a day (BID) | ORAL | Status: DC

## 2014-01-07 NOTE — Discharge Summary (Signed)
Patient ID: [name] MRN: [id number] DOB/AGE: 1941/05/13 73 y.o.  Admit date: 01/05/2014 Discharge date: 01/07/2014  Admission Diagnoses:  Principal Problem:   Arthritis of knee, left Active Problems:   Status post total knee replacement   Discharge Diagnoses:  Same  Past Medical History  Diagnosis Date  . Neuropathy 1994  . Hypertension   . Hyperlipidemia   . Gout   . Hypothyroidism     Following partial thyroidectomy  . GERD (gastroesophageal reflux disease)   . Hx of adenomatous colonic polyps   . Barrett's esophagus   . Hiatal hernia   . Bradycardia 2009    Sinus rhythm in the 40s without symptoms;  . 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 Arthritis     "fingers; knees; toes" (01/05/2014)  . Squamous carcinoma     skin cancer benign arms and right ear and rightside lip    Surgeries: Procedure(s): LEFT TOTAL KNEE ARTHROPLASTY on 01/05/2014   Consultants:    Discharged Condition: Improved  Hospital Course: JUSTIS CLOSSER is an 73 y.o. male who was admitted 01/05/2014 for operative treatment ofArthritis of knee, left. Patient has severe unremitting pain that affects sleep, daily activities, and work/hobbies. After pre-op clearance the patient was taken to the operating room on 01/05/2014 and underwent  Procedure(s): LEFT TOTAL KNEE ARTHROPLASTY.    Patient was given perioperative antibiotics: Anti-infectives   Start     Dose/Rate Route Frequency Ordered Stop   01/05/14 1900  ceFAZolin (ANCEF) IVPB 1 g/50 mL premix     1 g 100 mL/hr over 30 Minutes Intravenous Every 6 hours 01/05/14 1730 01/06/14 0045   01/05/14 0600  ceFAZolin (ANCEF) IVPB 2 g/50 mL premix     2 g 100 mL/hr over 30 Minutes Intravenous On call to O.R. 01/04/14 1748 01/05/14 1310       Patient was given sequential compression devices, early ambulation, and chemoprophylaxis to prevent DVT.  Patient benefited maximally from hospital stay and there were no  complications.    Recent vital signs: Patient Vitals for the past 24 hrs:  BP Temp Temp src Pulse Resp SpO2  01/07/14 0555 119/69 mmHg 98.7 F (37.1 C) Oral 102 18 100 %  01/06/14 2024 122/69 mmHg 98.9 F (37.2 C) Oral 85 18 95 %  01/06/14 1607 97/59 mmHg 98.3 F (36.8 C) Oral 85 18 97 %  01/06/14 1600 - - - - 16 -  01/06/14 1200 - - - - 16 -     Recent laboratory studies:  Recent Labs  01/06/14 0431 01/07/14 0413  WBC 12.0* 10.9*  HGB 10.6* 9.8*  HCT 32.3* 30.4*  PLT 156 150  NA 137  --   K 4.5  --   CL 99  --   CO2 23  --   BUN 16  --   CREATININE 1.15  --   GLUCOSE 157*  --   CALCIUM 8.7  --      Discharge Medications:     Medication List         amLODipine 10 MG tablet  Commonly known as:  NORVASC  Take 10 mg by mouth every morning.     aspirin 325 MG EC tablet  Take 1 tablet (325 mg total) by mouth 2 (two) times daily after a meal.     DSS 100 MG Caps  Take 100 mg by mouth 2 (two) times daily.     levothyroxine 150  MCG tablet  Commonly known as:  SYNTHROID, LEVOTHROID  Take 150 mcg by mouth daily before breakfast.     lisinopril-hydrochlorothiazide 20-12.5 MG per tablet  Commonly known as:  PRINZIDE,ZESTORETIC  Take 1 tablet by mouth daily.     omeprazole 40 MG capsule  Commonly known as:  PRILOSEC  Take 40 mg by mouth daily as needed (reflux).     oxyCODONE-acetaminophen 5-325 MG per tablet  Commonly known as:  ROXICET  Take 1-2 tablets by mouth every 4 (four) hours as needed for severe pain.     predniSONE 10 MG tablet  Commonly known as:  STERAPRED UNI-PAK  Take by mouth daily. On a taper for 12 days        Diagnostic Studies: Dg Chest 2 View  12/26/2013   CLINICAL DATA:  Preoperative evaluation for knee replacement  EXAM: CHEST  2 VIEW  COMPARISON:  09/29/2007  FINDINGS: Cardiac shadow is within normal limits. The lungs are clear bilaterally. No focal infiltrate or sizable effusion is seen. Degenerative change of the thoracic spine is  noted.  IMPRESSION: No active cardiopulmonary disease.   Electronically Signed   By: Inez Catalina M.D.   On: 12/26/2013 11:34   Dg Knee Left Port  01/05/2014   CLINICAL DATA:  Status post left total knee joint replacement.  EXAM: PORTABLE LEFT KNEE - 1-2 VIEW  COMPARISON:  None.  FINDINGS: Radiographic positioning of the prosthetic components is good. The native bone is normal. Surgical drain lines and skin staples are present. There is gas within the subcutaneous soft tissues anteriorly.  IMPRESSION: Status post left total knee arthroplasty without immediate postprocedure complication.   Electronically Signed   By: David  Martinique   On: 01/05/2014 16:49    Disposition: 01-Home or Self Care      Discharge Instructions   Call MD / Call 911    Complete by:  As directed   If you experience chest pain or shortness of breath, CALL 911 and be transported to the hospital emergency room.  If you develope a fever above 101 F, pus (white drainage) or increased drainage or redness at the wound, or calf pain, call your surgeon's office.     Constipation Prevention    Complete by:  As directed   Drink plenty of fluids.  Prune juice may be helpful.  You may use a stool softener, such as Colace (over the counter) 100 mg twice a day.  Use MiraLax (over the counter) for constipation as needed.     Diet - low sodium heart healthy    Complete by:  As directed      Discharge instructions    Complete by:  As directed   Increase your activities as comfort allows. Ice and elevation for leg and knee swelling. Full weight as tolerated left knee; work on knee motion. Do take an over-the-counter stool softener twice daily.     Discharge patient    Complete by:  As directed      Increase activity slowly as tolerated    Complete by:  As directed      Weight bearing as tolerated    Complete by:  As directed            Follow-up Information   Follow up with Mcarthur Rossetti, MD In 2 weeks.   Specialty:   Orthopedic Surgery   Contact information:   [place] [address] [phone]        Signed: Jean Rosenthal  Y 01/07/2014, 8:26 AM

## 2014-01-07 NOTE — Progress Notes (Signed)
Clinical Education officer, museum (CSW) received consult for SNF placement. Per chart patient is D/C'ing home today. Please reconsult if future social work needs arise. CSW signing off.   Blima Rich, [place] Weekend CSW [phone]

## 2014-01-07 NOTE — Progress Notes (Signed)
Subjective: 2 Days Post-Op Procedure(s) (LRB): LEFT TOTAL KNEE ARTHROPLASTY (Left) Patient reports pain as moderate.  Working well with therapy.  Asymptomatic acute blood loss anemia.  Objective: Vital signs in last 24 hours: Temp:  [98.3 F (36.8 C)-98.9 F (37.2 C)] 98.7 F (37.1 C) (07/05 0555) Pulse Rate:  [85-102] 102 (07/05 0555) Resp:  [16-18] 18 (07/05 0555) BP: (97-122)/(59-69) 119/69 mmHg (07/05 0555) SpO2:  [95 %-100 %] 100 % (07/05 0555)  Intake/Output from previous day: 07/04 0701 - 07/05 0700 In: 600 [P.O.:600] Out: 1650 [Urine:1350; Drains:300] Intake/Output this shift: Total I/O In: 320 [P.O.:320] Out: -    Recent Labs  01/06/14 0431 01/07/14 0413  HGB 10.6* 9.8*    Recent Labs  01/06/14 0431 01/07/14 0413  WBC 12.0* 10.9*  RBC 3.58* 3.33*  HCT 32.3* 30.4*  PLT 156 150    Recent Labs  01/06/14 0431  NA 137  K 4.5  CL 99  CO2 23  BUN 16  CREATININE 1.15  GLUCOSE 157*  CALCIUM 8.7   No results found for this basename: LABPT, INR,  in the last 72 hours  Sensation intact distally Intact pulses distally Dorsiflexion/Plantar flexion intact Incision: no drainage No cellulitis present Compartment soft  Assessment/Plan: 2 Days Post-Op Procedure(s) (LRB): LEFT TOTAL KNEE ARTHROPLASTY (Left) Up with therapy Discharge home with home health  Mcarthur Rossetti 01/07/2014, 8:22 AM

## 2014-01-07 NOTE — Progress Notes (Signed)
Physical Therapy Treatment Patient Details Name: JOS CYGAN MRN: [id number] DOB: 1940/12/03 Today's Date: 01/07/2014    History of Present Illness s/p Lt TKA 01/05/14    PT Comments    This session focused on pt having the skills to be able to manage safely at home, including gait and steps, which he handled quite well; OK for dc home from PT standpoint   Follow Up Recommendations  Home health PT;Supervision/Assistance - 24 hour     Equipment Recommendations  None recommended by PT    Recommendations for Other Services OT consult     Precautions / Restrictions Precautions Precautions: Knee Precaution Comments: Reinforced prec Required Braces or Orthoses: Knee Immobilizer - Left Knee Immobilizer - Left: On when out of bed or walking Restrictions Weight Bearing Restrictions: No LLE Weight Bearing: Weight bearing as tolerated    Mobility  Bed Mobility Overal bed mobility: Modified Independent Bed Mobility: Supine to Sit     Supine to sit: Modified independent (Device/Increase time)     General bed mobility comments: pretty smooth transitions  Transfers Overall transfer level: Needs assistance Equipment used: Rolling walker (2 wheeled) Transfers: Sit to/from Stand Sit to Stand: Supervision         General transfer comment: supervision for cues for hand placement and safety with RW; Gave the tip of having RLE assist LLE to ground as he scoots forward to help with pain only initially -- stressed that it will be better when he dowsn't need the stronger LE to help the LLE  Ambulation/Gait Ambulation/Gait assistance: Supervision Ambulation Distance (Feet): 250 Feet Assistive device: Rolling walker (2 wheeled) Gait Pattern/deviations: Step-through pattern Gait velocity: decreased   General Gait Details: No noted buckling with cues to activate quad for satnce stability   Stairs Stairs: Yes Stairs assistance: Min guard Stair Management: No rails;Step to  pattern;Forwards;Backwards;With walker Number of Stairs: 1 (x2) General stair comments: Demo cues and then verbal cues for sequence and technique  Wheelchair Mobility    Modified Rankin (Stroke Patients Only)       Balance     Sitting balance-Leahy Scale: Good       Standing balance-Leahy Scale: Fair                      Cognition Arousal/Alertness: Awake/alert Behavior During Therapy: WFL for tasks assessed/performed Overall Cognitive Status: Within Functional Limits for tasks assessed                      Exercises      General Comments        Pertinent Vitals/Pain 4/10 pain L knee patient repositioned for comfort and Optimal knee extension     Home Living                      Prior Function            PT Goals (current goals can now be found in the care plan section) Acute Rehab PT Goals Patient Stated Goal: hopes to go home today PT Goal Formulation: With patient Time For Goal Achievement: 01/09/14 Potential to Achieve Goals: Good Progress towards PT goals: Progressing toward goals    Frequency  7X/week    PT Plan Current plan remains appropriate    Co-evaluation             End of Session Equipment Utilized During Treatment: Gait belt;Left knee immobilizer Activity Tolerance: Patient tolerated treatment well Patient left:  in bed;with call bell/phone within reach;in CPM     Time: 0810-0851 (-approx5 minutes for bathroom break) PT Time Calculation (min): 41 min  Charges:  $Gait Training: 23-37 mins                    G Codes:      Roney Marion Hamff 01/07/2014, 11:29 AM  Roney Marion, [place] Pager [phone] Office [phone]

## 2014-01-09 ENCOUNTER — Encounter (HOSPITAL_COMMUNITY): Payer: Self-pay | Admitting: Orthopaedic Surgery

## 2014-04-13 ENCOUNTER — Other Ambulatory Visit (HOSPITAL_COMMUNITY): Payer: Self-pay | Admitting: Orthopaedic Surgery

## 2014-04-17 ENCOUNTER — Inpatient Hospital Stay: Admit: 2014-04-17 | Payer: Self-pay | Admitting: Orthopaedic Surgery

## 2014-04-17 SURGERY — ARTHROPLASTY, KNEE, TOTAL
Anesthesia: General | Site: Knee | Laterality: Right

## 2014-04-20 ENCOUNTER — Other Ambulatory Visit: Payer: Self-pay

## 2014-04-25 ENCOUNTER — Encounter (HOSPITAL_COMMUNITY): Payer: Self-pay | Admitting: Pharmacy Technician

## 2014-04-26 ENCOUNTER — Other Ambulatory Visit (HOSPITAL_COMMUNITY): Payer: Self-pay | Admitting: *Deleted

## 2014-04-26 NOTE — Patient Instructions (Addendum)
[name]  04/26/2014                           YOUR PROCEDURE IS SCHEDULED ON:  05/04/14                ENTER FROM [address] - GO TO PARKING DECK               LOOK FOR [facility]  / [employer]                              FOLLOW  SIGNS TO SHORT STAY CENTER                 ARRIVE AT SHORT STAY AT: 8:15 am               CALL THIS NUMBER IF ANY PROBLEMS THE DAY OF SURGERY :               [phone]                                REMEMBER:   Do not eat food or drink liquids AFTER MIDNIGHT                  Take these medicines the morning of surgery with               A SIPS OF WATER :  AMLODIPINE / LEVOTHYROXINE       Do not wear jewelry, make-up   Do not wear lotions, powders, or perfumes.   Do not shave legs or underarms 12 hrs. before surgery (men may shave face)  Do not bring valuables to the hospital.  Contacts, dentures or bridgework may not be worn into surgery.  Leave suitcase in the car. After surgery it may be brought to your room.  For patients admitted to the hospital more than one night, checkout time is            11:00 AM                                                        ________________________________________________________________________                                                                                                  [name]  Before surgery, you can play an important role.  Because skin is not sterile, your skin needs to be as free of germs as possible.  You can reduce the number of germs on your skin by washing with CHG (chlorahexidine gluconate) soap before surgery.  CHG is an antiseptic cleaner which kills germs and bonds with the skin to continue killing germs even after washing. Please DO NOT  use if you have an allergy to CHG or antibacterial soaps.  If your skin becomes reddened/irritated stop using the CHG and inform your nurse when you arrive at Short Stay. Do not shave  (including legs and underarms) for at least 48 hours prior to the first CHG shower.  You may shave your face. Please follow these instructions carefully:   1.  Shower with CHG Soap the night before surgery and the  morning of Surgery.   2.  If you choose to wash your hair, wash your hair first as usual with your  normal  Shampoo.   3.  After you shampoo, rinse your hair and body thoroughly to remove the  shampoo.                                         4.  Use CHG as you would any other liquid soap.  You can apply chg directly  to the skin and wash . Gently wash with scrungie or clean wascloth    5.  Apply the CHG Soap to your body ONLY FROM THE NECK DOWN.   Do not use on open                           Wound or open sores. Avoid contact with eyes, ears mouth and genitals (private parts).                        Genitals (private parts) with your normal soap.              6.  Wash thoroughly, paying special attention to the area where your surgery  will be performed.   7.  Thoroughly rinse your body with warm water from the neck down.   8.  DO NOT shower/wash with your normal soap after using and rinsing off  the CHG Soap .                9.  Pat yourself dry with a clean towel.             10.  Wear clean pajamas.             11.  Place clean sheets on your bed the night of your first shower and do not  sleep with pets.  Day of Surgery : Do not apply any lotions/deodorants the morning of surgery.  Please wear clean clothes to the hospital/surgery center.  FAILURE TO FOLLOW THESE INSTRUCTIONS MAY RESULT IN THE CANCELLATION OF YOUR SURGERY    PATIENT SIGNATURE_________________________________  ______________________________________________________________________    WHAT IS A BLOOD TRANSFUSION? Blood Transfusion Information  A transfusion is the replacement of blood or some of its parts. Blood is made up of multiple cells which provide different functions.  Red blood cells  carry oxygen and are used for blood loss replacement.  White blood cells fight against infection.  Platelets control bleeding.  Plasma helps clot blood.  Other blood products are available for specialized needs, such as hemophilia or other clotting disorders. BEFORE THE TRANSFUSION  Who gives blood for transfusions?   Healthy volunteers who are fully evaluated to make sure their blood is safe. This is blood bank blood. Transfusion therapy is the safest it has ever been in the practice of medicine. Before blood is taken from a donor,  a complete history is taken to make sure that person has no history of diseases nor engages in risky social behavior (examples are intravenous drug use or sexual activity with multiple partners). The donor's travel history is screened to minimize risk of transmitting infections, such as malaria. The donated blood is tested for signs of infectious diseases, such as HIV and hepatitis. The blood is then tested to be sure it is compatible with you in order to minimize the chance of a transfusion reaction. If you or a relative donates blood, this is often done in anticipation of surgery and is not appropriate for emergency situations. It takes many days to process the donated blood. RISKS AND COMPLICATIONS Although transfusion therapy is very safe and saves many lives, the main dangers of transfusion include:   Getting an infectious disease.  Developing a transfusion reaction. This is an allergic reaction to something in the blood you were given. Every precaution is taken to prevent this. The decision to have a blood transfusion has been considered carefully by your caregiver before blood is given. Blood is not given unless the benefits outweigh the risks. AFTER THE TRANSFUSION  Right after receiving a blood transfusion, you will usually feel much better and more energetic. This is especially true if your red blood cells have gotten low (anemic). The transfusion raises  the level of the red blood cells which carry oxygen, and this usually causes an energy increase.  The nurse administering the transfusion will monitor you carefully for complications. HOME CARE INSTRUCTIONS  No special instructions are needed after a transfusion. You may find your energy is better. Speak with your caregiver about any limitations on activity for underlying diseases you may have. SEEK MEDICAL CARE IF:   Your condition is not improving after your transfusion.  You develop redness or irritation at the intravenous (IV) site. SEEK IMMEDIATE MEDICAL CARE IF:  Any of the following symptoms occur over the next 12 hours:  Shaking chills.  You have a temperature by mouth above 102 F (38.9 C), not controlled by medicine.  Chest, back, or muscle pain.  People around you feel you are not acting correctly or are confused.  Shortness of breath or difficulty breathing.  Dizziness and fainting.  You get a rash or develop hives.  You have a decrease in urine output.  Your urine turns a dark color or changes to pink, red, or brown. Any of the following symptoms occur over the next 10 days:  You have a temperature by mouth above 102 F (38.9 C), not controlled by medicine.  Shortness of breath.  Weakness after normal activity.  The white part of the eye turns yellow (jaundice).  You have a decrease in the amount of urine or are urinating less often.  Your urine turns a dark color or changes to pink, red, or brown. Document Released: 06/19/2000 Document Revised: 09/14/2011 Document Reviewed: 02/06/2008 [employer] Patient Information 2014 [place], [place].  _______________________________________________________________________

## 2014-04-27 ENCOUNTER — Encounter (HOSPITAL_COMMUNITY): Payer: Self-pay

## 2014-04-27 ENCOUNTER — Encounter (HOSPITAL_COMMUNITY)
Admission: RE | Admit: 2014-04-27 | Discharge: 2014-04-27 | Disposition: A | Discharge status: Home / Self Care | Payer: Medicare Other | Source: Ambulatory Visit | Attending: Orthopaedic Surgery | Admitting: Orthopaedic Surgery

## 2014-04-27 DIAGNOSIS — Z01812 Encounter for preprocedural laboratory examination: Secondary | ICD-10-CM | POA: Insufficient documentation

## 2014-04-27 HISTORY — DX: Benign prostatic hyperplasia without lower urinary tract symptoms: N40.0

## 2014-04-27 HISTORY — DX: Personal history of other malignant neoplasm of skin: Z85.828

## 2014-04-27 LAB — BASIC METABOLIC PANEL
Anion gap: 14 (ref 5–15)
BUN: 14 mg/dL (ref 6–23)
CO2: 27 mEq/L (ref 19–32)
Calcium: 9.9 mg/dL (ref 8.4–10.5)
Chloride: 100 mEq/L (ref 96–112)
Creatinine, Ser: 1.05 mg/dL (ref 0.50–1.35)
GFR calc Af Amer: 79 mL/min — ABNORMAL LOW (ref >90)
GFR calc non Af Amer: 68 mL/min — ABNORMAL LOW (ref >90)
Glucose, Bld: 120 mg/dL — ABNORMAL HIGH (ref 70–99)
Potassium: 3.8 mEq/L (ref 3.7–5.3)
Sodium: 141 mEq/L (ref 137–147)

## 2014-04-27 LAB — URINALYSIS, ROUTINE W REFLEX MICROSCOPIC
Bilirubin Urine: NEGATIVE
Glucose, UA: NEGATIVE mg/dL
Hgb urine dipstick: NEGATIVE
Ketones, ur: NEGATIVE mg/dL
Leukocytes, UA: NEGATIVE
Nitrite: NEGATIVE
Protein, ur: NEGATIVE mg/dL
Specific Gravity, Urine: 1.012 (ref 1.005–1.030)
Urobilinogen, UA: 0.2 mg/dL (ref 0.0–1.0)
pH: 7.5 (ref 5.0–8.0)

## 2014-04-27 LAB — CBC
HCT: 41.6 % (ref 39.0–52.0)
Hemoglobin: 14 g/dL (ref 13.0–17.0)
MCH: 29.4 pg (ref 26.0–34.0)
MCHC: 33.7 g/dL (ref 30.0–36.0)
MCV: 87.2 fL (ref 78.0–100.0)
Platelets: 206 10*3/uL (ref 150–400)
RBC: 4.77 MIL/uL (ref 4.22–5.81)
RDW: 12.8 % (ref 11.5–15.5)
WBC: 6.8 10*3/uL (ref 4.0–10.5)

## 2014-04-27 LAB — SURGICAL PCR SCREEN
MRSA, PCR: NEGATIVE
Staphylococcus aureus: NEGATIVE

## 2014-04-27 LAB — APTT: aPTT: 33 seconds (ref 24–37)

## 2014-04-27 LAB — PROTIME-INR
INR: 1.08 (ref 0.00–1.49)
Prothrombin Time: 14.1 seconds (ref 11.6–15.2)

## 2014-04-27 LAB — ABO/RH: ABO/RH(D): O NEG

## 2014-04-27 NOTE — Progress Notes (Signed)
04/27/14 0854  OBSTRUCTIVE SLEEP APNEA  Have you ever been diagnosed with sleep apnea through a sleep study? No  Do you snore loudly (loud enough to be heard through closed doors)?  1  Do you often feel tired, fatigued, or sleepy during the daytime? 0  Has anyone observed you stop breathing during your sleep? 0  Do you have, or are you being treated for high blood pressure? 1  BMI more than 35 kg/m2? 1  Age over 73 years old? 1  Neck circumference greater than 40 cm/16 inches? 1  Gender: 1  Obstructive Sleep Apnea Score 6  Score 4 or greater  Results sent to PCP

## 2014-05-03 NOTE — Anesthesia Preprocedure Evaluation (Addendum)
Anesthesia Evaluation  Patient identified by MRN, date of birth, ID band Patient awake    Reviewed: Allergy & Precautions, H&P , NPO status , Patient's Chart, lab work & pertinent test results  History of Anesthesia Complications Negative for: history of anesthetic complications  Airway Mallampati: III  TM Distance: >3 FB Neck ROM: Full    Dental no notable dental hx. (+) Edentulous Upper, Edentulous Lower   Pulmonary former smoker,  breath sounds clear to auscultation  Pulmonary exam normal       Cardiovascular Exercise Tolerance: Good hypertension, Pt. on medications Rhythm:Regular Rate:Normal     Neuro/Psych negative neurological ROS  negative psych ROS   GI/Hepatic Neg liver ROS, GERD-  Medicated and Controlled,  Endo/Other  Hypothyroidism   Renal/GU negative Renal ROS  negative genitourinary   Musculoskeletal  (+) Arthritis -, Osteoarthritis,    Abdominal   Peds negative pediatric ROS (+)  Hematology negative hematology ROS (+)   Anesthesia Other Findings   Reproductive/Obstetrics negative OB ROS                            Anesthesia Physical Anesthesia Plan  ASA: II  Anesthesia Plan: General and Regional   Post-op Pain Management:    Induction: Intravenous  Airway Management Planned: Oral ETT  Additional Equipment:   Intra-op Plan:   Post-operative Plan: Extubation in OR  Informed Consent: I have reviewed the patients History and Physical, chart, labs and discussed the procedure including the risks, benefits and alternatives for the proposed anesthesia with the patient or authorized representative who has indicated his/her understanding and acceptance.   Dental advisory given  Plan Discussed with: CRNA  Anesthesia Plan Comments:        Anesthesia Quick Evaluation

## 2014-05-04 ENCOUNTER — Encounter (HOSPITAL_COMMUNITY): Payer: Medicare Other | Admitting: Anesthesiology

## 2014-05-04 ENCOUNTER — Encounter (HOSPITAL_COMMUNITY)
Admission: RE | Disposition: A | Discharge status: Home Health | Payer: Self-pay | Source: Ambulatory Visit | Attending: Orthopaedic Surgery

## 2014-05-04 ENCOUNTER — Inpatient Hospital Stay (HOSPITAL_COMMUNITY)
Admission: RE | Admit: 2014-05-04 | Discharge: 2014-05-06 | DRG: 470 | Disposition: A | Discharge status: Home Health | Payer: Medicare Other | Source: Ambulatory Visit | Attending: Orthopaedic Surgery | Admitting: Orthopaedic Surgery

## 2014-05-04 ENCOUNTER — Inpatient Hospital Stay (HOSPITAL_COMMUNITY): Payer: Medicare Other

## 2014-05-04 ENCOUNTER — Inpatient Hospital Stay (HOSPITAL_COMMUNITY): Payer: Medicare Other | Admitting: Anesthesiology

## 2014-05-04 ENCOUNTER — Encounter (HOSPITAL_COMMUNITY): Payer: Self-pay | Admitting: *Deleted

## 2014-05-04 DIAGNOSIS — Z96652 Presence of left artificial knee joint: Secondary | ICD-10-CM | POA: Diagnosis present

## 2014-05-04 DIAGNOSIS — K219 Gastro-esophageal reflux disease without esophagitis: Secondary | ICD-10-CM | POA: Diagnosis present

## 2014-05-04 DIAGNOSIS — Z87891 Personal history of nicotine dependence: Secondary | ICD-10-CM | POA: Diagnosis not present

## 2014-05-04 DIAGNOSIS — I10 Essential (primary) hypertension: Secondary | ICD-10-CM | POA: Diagnosis present

## 2014-05-04 DIAGNOSIS — Z01812 Encounter for preprocedural laboratory examination: Secondary | ICD-10-CM

## 2014-05-04 DIAGNOSIS — Z96659 Presence of unspecified artificial knee joint: Secondary | ICD-10-CM

## 2014-05-04 DIAGNOSIS — M1711 Unilateral primary osteoarthritis, right knee: Secondary | ICD-10-CM | POA: Diagnosis not present

## 2014-05-04 DIAGNOSIS — M25561 Pain in right knee: Secondary | ICD-10-CM | POA: Diagnosis present

## 2014-05-04 DIAGNOSIS — Z6835 Body mass index (BMI) 35.0-35.9, adult: Secondary | ICD-10-CM | POA: Diagnosis not present

## 2014-05-04 DIAGNOSIS — E039 Hypothyroidism, unspecified: Secondary | ICD-10-CM | POA: Diagnosis present

## 2014-05-04 DIAGNOSIS — Z96651 Presence of right artificial knee joint: Secondary | ICD-10-CM

## 2014-05-04 HISTORY — PX: TOTAL KNEE ARTHROPLASTY: SHX125

## 2014-05-04 LAB — TYPE AND SCREEN
ABO/RH(D): O NEG
Antibody Screen: NEGATIVE

## 2014-05-04 SURGERY — ARTHROPLASTY, KNEE, TOTAL
Anesthesia: Regional | Site: Knee | Laterality: Right

## 2014-05-04 MED ORDER — MENTHOL 3 MG MT LOZG
1.0000 | LOZENGE | OROMUCOSAL | Status: DC | PRN
  Filled 2014-05-04: qty 9

## 2014-05-04 MED ORDER — FENTANYL CITRATE 0.05 MG/ML IJ SOLN
INTRAMUSCULAR | Status: AC
  Filled 2014-05-04: qty 2

## 2014-05-04 MED ORDER — HYDROMORPHONE HCL 2 MG/ML IJ SOLN
INTRAMUSCULAR | Status: AC
  Filled 2014-05-04: qty 1

## 2014-05-04 MED ORDER — ONDANSETRON HCL 4 MG/2ML IJ SOLN
INTRAMUSCULAR | Status: AC
  Filled 2014-05-04: qty 2

## 2014-05-04 MED ORDER — DOCUSATE SODIUM 100 MG PO CAPS
100.0000 mg | ORAL_CAPSULE | Freq: Two times a day (BID) | ORAL | Status: DC
  Administered 2014-05-04 – 2014-05-06 (×5): 100 mg via ORAL

## 2014-05-04 MED ORDER — LACTATED RINGERS IV SOLN: INTRAVENOUS | Status: DC

## 2014-05-04 MED ORDER — POLYETHYLENE GLYCOL 3350 17 G PO PACK: 17.0000 g | PACK | Freq: Every day | ORAL | Status: DC | PRN

## 2014-05-04 MED ORDER — ACETAMINOPHEN 325 MG PO TABS
650.0000 mg | ORAL_TABLET | Freq: Four times a day (QID) | ORAL | Status: DC | PRN
  Administered 2014-05-05 – 2014-05-06 (×2): 650 mg via ORAL
  Filled 2014-05-04 (×2): qty 2

## 2014-05-04 MED ORDER — TRANEXAMIC ACID 100 MG/ML IV SOLN
1000.0000 mg | INTRAVENOUS | Status: AC
  Administered 2014-05-04: 1000 mg via INTRAVENOUS
  Filled 2014-05-04: qty 10

## 2014-05-04 MED ORDER — ROPIVACAINE HCL 5 MG/ML IJ SOLN
INTRAMUSCULAR | Status: AC
  Filled 2014-05-04: qty 30

## 2014-05-04 MED ORDER — SODIUM CHLORIDE 0.9 % IJ SOLN
INTRAMUSCULAR | Status: AC
Start: 2014-05-04 — End: 2014-05-04
  Filled 2014-05-04: qty 10

## 2014-05-04 MED ORDER — OXYCODONE HCL 5 MG PO TABS
5.0000 mg | ORAL_TABLET | ORAL | Status: DC | PRN
  Administered 2014-05-04 (×2): 5 mg via ORAL
  Administered 2014-05-05 – 2014-05-06 (×7): 10 mg via ORAL
  Filled 2014-05-04 (×2): qty 1
  Filled 2014-05-04 (×8): qty 2

## 2014-05-04 MED ORDER — LEVOTHYROXINE SODIUM 150 MCG PO TABS
150.0000 ug | ORAL_TABLET | Freq: Every day | ORAL | Status: DC
  Administered 2014-05-05 – 2014-05-06 (×2): 150 ug via ORAL
  Filled 2014-05-04 (×3): qty 1

## 2014-05-04 MED ORDER — PROPOFOL 10 MG/ML IV BOLUS
INTRAVENOUS | Status: AC
  Filled 2014-05-04: qty 20

## 2014-05-04 MED ORDER — FENTANYL CITRATE 0.05 MG/ML IJ SOLN
50.0000 ug | INTRAMUSCULAR | Status: DC | PRN
  Administered 2014-05-04: 50 ug via INTRAVENOUS

## 2014-05-04 MED ORDER — LIDOCAINE HCL (CARDIAC) 20 MG/ML IV SOLN
INTRAVENOUS | Status: DC | PRN
  Administered 2014-05-04: 50 mg via INTRAVENOUS

## 2014-05-04 MED ORDER — EPHEDRINE SULFATE 50 MG/ML IJ SOLN
INTRAMUSCULAR | Status: AC
  Filled 2014-05-04: qty 1

## 2014-05-04 MED ORDER — CEFAZOLIN SODIUM-DEXTROSE 2-3 GM-% IV SOLR
2.0000 g | INTRAVENOUS | Status: AC
  Administered 2014-05-04: 2 g via INTRAVENOUS

## 2014-05-04 MED ORDER — ALUM & MAG HYDROXIDE-SIMETH 200-200-20 MG/5ML PO SUSP
30.0000 mL | ORAL | Status: DC | PRN
  Administered 2014-05-05: 30 mL via ORAL
  Filled 2014-05-04: qty 30

## 2014-05-04 MED ORDER — LISINOPRIL 20 MG PO TABS
20.0000 mg | ORAL_TABLET | Freq: Every day | ORAL | Status: DC
  Administered 2014-05-04 – 2014-05-06 (×3): 20 mg via ORAL
  Filled 2014-05-04 (×3): qty 1

## 2014-05-04 MED ORDER — ACETAMINOPHEN 650 MG RE SUPP: 650.0000 mg | Freq: Four times a day (QID) | RECTAL | Status: DC | PRN

## 2014-05-04 MED ORDER — LACTATED RINGERS IV SOLN
INTRAVENOUS | Status: DC
  Administered 2014-05-04: 1000 mL via INTRAVENOUS

## 2014-05-04 MED ORDER — EPHEDRINE SULFATE 50 MG/ML IJ SOLN
INTRAMUSCULAR | Status: DC | PRN
  Administered 2014-05-04 (×2): 5 mg via INTRAVENOUS

## 2014-05-04 MED ORDER — LISINOPRIL-HYDROCHLOROTHIAZIDE 20-12.5 MG PO TABS: 1.0000 | ORAL_TABLET | Freq: Two times a day (BID) | ORAL | Status: DC

## 2014-05-04 MED ORDER — ONDANSETRON HCL 4 MG/2ML IJ SOLN: 4.0000 mg | Freq: Four times a day (QID) | INTRAMUSCULAR | Status: DC | PRN

## 2014-05-04 MED ORDER — ROCURONIUM BROMIDE 100 MG/10ML IV SOLN
INTRAVENOUS | Status: DC | PRN
  Administered 2014-05-04: 40 mg via INTRAVENOUS

## 2014-05-04 MED ORDER — MIDAZOLAM HCL 2 MG/2ML IJ SOLN
1.0000 mg | INTRAMUSCULAR | Status: DC | PRN
  Administered 2014-05-04: 1 mg via INTRAVENOUS

## 2014-05-04 MED ORDER — MIDAZOLAM HCL 2 MG/2ML IJ SOLN
INTRAMUSCULAR | Status: AC
  Filled 2014-05-04: qty 2

## 2014-05-04 MED ORDER — 0.9 % SODIUM CHLORIDE (POUR BTL) OPTIME
TOPICAL | Status: DC | PRN
  Administered 2014-05-04: 1000 mL

## 2014-05-04 MED ORDER — METOCLOPRAMIDE HCL 10 MG PO TABS: 5.0000 mg | ORAL_TABLET | Freq: Three times a day (TID) | ORAL | Status: DC | PRN

## 2014-05-04 MED ORDER — HYDROCHLOROTHIAZIDE 12.5 MG PO CAPS
12.5000 mg | ORAL_CAPSULE | Freq: Every day | ORAL | Status: DC
Start: 2014-05-05 — End: 2014-05-06
  Administered 2014-05-05 – 2014-05-06 (×2): 12.5 mg via ORAL
  Filled 2014-05-04 (×2): qty 1

## 2014-05-04 MED ORDER — ROPIVACAINE HCL 5 MG/ML IJ SOLN
INTRAMUSCULAR | Status: DC | PRN
  Administered 2014-05-04: 30 mL

## 2014-05-04 MED ORDER — DEXAMETHASONE SODIUM PHOSPHATE 10 MG/ML IJ SOLN
INTRAMUSCULAR | Status: AC
  Filled 2014-05-04: qty 1

## 2014-05-04 MED ORDER — ONDANSETRON HCL 4 MG/2ML IJ SOLN
INTRAMUSCULAR | Status: DC | PRN
  Administered 2014-05-04: 4 mg via INTRAVENOUS

## 2014-05-04 MED ORDER — AMLODIPINE BESYLATE 10 MG PO TABS
10.0000 mg | ORAL_TABLET | Freq: Every morning | ORAL | Status: DC
  Administered 2014-05-05 – 2014-05-06 (×2): 10 mg via ORAL
  Filled 2014-05-04 (×2): qty 1

## 2014-05-04 MED ORDER — FENTANYL CITRATE 0.05 MG/ML IJ SOLN
25.0000 ug | INTRAMUSCULAR | Status: DC | PRN
  Administered 2014-05-04 (×3): 50 ug via INTRAVENOUS

## 2014-05-04 MED ORDER — HYDROMORPHONE HCL 1 MG/ML IJ SOLN
INTRAMUSCULAR | Status: DC | PRN
  Administered 2014-05-04 (×4): 0.5 mg via INTRAVENOUS

## 2014-05-04 MED ORDER — ONDANSETRON HCL 4 MG/2ML IJ SOLN: 4.0000 mg | Freq: Once | INTRAMUSCULAR | Status: DC | PRN

## 2014-05-04 MED ORDER — LACTATED RINGERS IV SOLN
INTRAVENOUS | Status: DC | PRN
  Administered 2014-05-04 (×2): via INTRAVENOUS

## 2014-05-04 MED ORDER — FENTANYL CITRATE 0.05 MG/ML IJ SOLN
INTRAMUSCULAR | Status: DC | PRN
  Administered 2014-05-04 (×2): 50 ug via INTRAVENOUS

## 2014-05-04 MED ORDER — HYDROMORPHONE HCL 1 MG/ML IJ SOLN
1.0000 mg | INTRAMUSCULAR | Status: DC | PRN
  Administered 2014-05-05 (×2): 1 mg via INTRAVENOUS
  Filled 2014-05-04 (×2): qty 1

## 2014-05-04 MED ORDER — ZOLPIDEM TARTRATE 5 MG PO TABS: 5.0000 mg | ORAL_TABLET | Freq: Every evening | ORAL | Status: DC | PRN

## 2014-05-04 MED ORDER — METHOCARBAMOL 500 MG PO TABS
500.0000 mg | ORAL_TABLET | Freq: Four times a day (QID) | ORAL | Status: DC | PRN
  Administered 2014-05-05 – 2014-05-06 (×3): 500 mg via ORAL
  Filled 2014-05-04 (×3): qty 1

## 2014-05-04 MED ORDER — RIVAROXABAN 10 MG PO TABS
10.0000 mg | ORAL_TABLET | Freq: Every day | ORAL | Status: DC
  Administered 2014-05-05 – 2014-05-06 (×2): 10 mg via ORAL
  Filled 2014-05-04 (×3): qty 1

## 2014-05-04 MED ORDER — TAMSULOSIN HCL 0.4 MG PO CAPS
0.4000 mg | ORAL_CAPSULE | Freq: Every day | ORAL | Status: DC
  Administered 2014-05-04 – 2014-05-05 (×2): 0.4 mg via ORAL
  Filled 2014-05-04 (×3): qty 1

## 2014-05-04 MED ORDER — DIPHENHYDRAMINE HCL 12.5 MG/5ML PO ELIX: 12.5000 mg | ORAL_SOLUTION | ORAL | Status: DC | PRN

## 2014-05-04 MED ORDER — CEFAZOLIN SODIUM-DEXTROSE 2-3 GM-% IV SOLR
INTRAVENOUS | Status: AC
  Filled 2014-05-04: qty 50

## 2014-05-04 MED ORDER — METHOCARBAMOL 1000 MG/10ML IJ SOLN
500.0000 mg | Freq: Four times a day (QID) | INTRAVENOUS | Status: DC | PRN
  Administered 2014-05-04: 500 mg via INTRAVENOUS
  Filled 2014-05-04: qty 5

## 2014-05-04 MED ORDER — SODIUM CHLORIDE 0.9 % IR SOLN
Status: DC | PRN
  Administered 2014-05-04: 2000 mL

## 2014-05-04 MED ORDER — ONDANSETRON HCL 4 MG PO TABS: 4.0000 mg | ORAL_TABLET | Freq: Four times a day (QID) | ORAL | Status: DC | PRN

## 2014-05-04 MED ORDER — SODIUM CHLORIDE 0.9 % IV SOLN
INTRAVENOUS | Status: DC
  Administered 2014-05-04 – 2014-05-05 (×2): via INTRAVENOUS

## 2014-05-04 MED ORDER — METOCLOPRAMIDE HCL 5 MG/ML IJ SOLN
5.0000 mg | Freq: Three times a day (TID) | INTRAMUSCULAR | Status: DC | PRN
Start: 2014-05-04 — End: 2014-05-06

## 2014-05-04 MED ORDER — CEFAZOLIN SODIUM 1-5 GM-% IV SOLN
1.0000 g | Freq: Four times a day (QID) | INTRAVENOUS | Status: AC
  Administered 2014-05-04 (×2): 1 g via INTRAVENOUS
  Filled 2014-05-04 (×2): qty 50

## 2014-05-04 MED ORDER — PHENOL 1.4 % MT LIQD
1.0000 | OROMUCOSAL | Status: DC | PRN
  Filled 2014-05-04: qty 177

## 2014-05-04 MED ORDER — DEXAMETHASONE SODIUM PHOSPHATE 10 MG/ML IJ SOLN
INTRAMUSCULAR | Status: DC | PRN
  Administered 2014-05-04: 10 mg via INTRAVENOUS

## 2014-05-04 SURGICAL SUPPLY — 54 items
APL SKNCLS STERI-STRIP NONHPOA (GAUZE/BANDAGES/DRESSINGS) ×1
BAG SPEC THK2 15X12 ZIP CLS (MISCELLANEOUS)
BAG ZIPLOCK 12X15 (MISCELLANEOUS) IMPLANT
BANDAGE ELASTIC 6 VELCRO ST LF (GAUZE/BANDAGES/DRESSINGS) ×3 IMPLANT
BANDAGE ESMARK 6X9 LF (GAUZE/BANDAGES/DRESSINGS) ×1 IMPLANT
BENZOIN TINCTURE PRP APPL 2/3 (GAUZE/BANDAGES/DRESSINGS) ×2 IMPLANT
BLADE SAG 18X100X1.27 (BLADE) ×2 IMPLANT
BNDG CMPR 9X6 STRL LF SNTH (GAUZE/BANDAGES/DRESSINGS) ×1
BNDG ESMARK 6X9 LF (GAUZE/BANDAGES/DRESSINGS) ×3
BOWL SMART MIX CTS (DISPOSABLE) ×3 IMPLANT
CEMENT BONE 1-PACK (Cement) ×6 IMPLANT
CLOSURE WOUND 1/2 X4 (GAUZE/BANDAGES/DRESSINGS) ×2
CUFF TOURN SGL QUICK 34 (TOURNIQUET CUFF) ×3
CUFF TRNQT CYL 34X4X40X1 (TOURNIQUET CUFF) ×1 IMPLANT
DRAPE EXTREMITY TIBURON (DRAPES) ×3 IMPLANT
DRAPE POUCH INSTRU U-SHP 10X18 (DRAPES) ×3 IMPLANT
DRAPE SHEET LG 3/4 BI-LAMINATE (DRAPES) ×2 IMPLANT
DRAPE U-SHAPE 47X51 STRL (DRAPES) ×3 IMPLANT
DRSG PAD ABDOMINAL 8X10 ST (GAUZE/BANDAGES/DRESSINGS) ×3 IMPLANT
DURAPREP 26ML APPLICATOR (WOUND CARE) ×3 IMPLANT
ELECT REM PT RETURN 9FT ADLT (ELECTROSURGICAL) ×3
ELECTRODE REM PT RTRN 9FT ADLT (ELECTROSURGICAL) ×1 IMPLANT
EVACUATOR 1/8 PVC DRAIN (DRAIN) IMPLANT
FACESHIELD WRAPAROUND (MASK) ×12 IMPLANT
FACESHIELD WRAPAROUND OR TEAM (MASK) ×5 IMPLANT
GAUZE SPONGE 4X4 12PLY STRL (GAUZE/BANDAGES/DRESSINGS) ×3 IMPLANT
GLOVE BIO SURGEON STRL SZ7.5 (GLOVE) ×3 IMPLANT
GLOVE BIOGEL PI IND STRL 8 (GLOVE) ×2 IMPLANT
GLOVE BIOGEL PI INDICATOR 8 (GLOVE) ×4
GLOVE ECLIPSE 8.0 STRL XLNG CF (GLOVE) ×3 IMPLANT
GOWN STRL REUS W/TWL XL LVL3 (GOWN DISPOSABLE) ×6 IMPLANT
HANDPIECE INTERPULSE COAX TIP (DISPOSABLE) ×3
IMMOBILIZER KNEE 20 (SOFTGOODS) ×3
IMMOBILIZER KNEE 20 THIGH 36 (SOFTGOODS) ×1 IMPLANT
KIT BASIN OR (CUSTOM PROCEDURE TRAY) ×3 IMPLANT
KNEE/VIT E POLY LINER LEVEL 1B ×2 IMPLANT
PACK TOTAL JOINT (CUSTOM PROCEDURE TRAY) ×3 IMPLANT
PADDING CAST COTTON 6X4 STRL (CAST SUPPLIES) ×4 IMPLANT
POSITIONER SURGICAL ARM (MISCELLANEOUS) ×3 IMPLANT
SET HNDPC FAN SPRY TIP SCT (DISPOSABLE) ×1 IMPLANT
SET PAD KNEE POSITIONER (MISCELLANEOUS) ×3 IMPLANT
STRIP CLOSURE SKIN 1/2X4 (GAUZE/BANDAGES/DRESSINGS) ×2 IMPLANT
SUCTION FRAZIER 12FR DISP (SUCTIONS) ×3 IMPLANT
SUT MNCRL AB 4-0 PS2 18 (SUTURE) ×2 IMPLANT
SUT VIC AB 0 CT1 27 (SUTURE) ×3
SUT VIC AB 0 CT1 27XBRD ANTBC (SUTURE) ×1 IMPLANT
SUT VIC AB 1 CT1 27 (SUTURE) ×3
SUT VIC AB 1 CT1 27XBRD ANTBC (SUTURE) ×1 IMPLANT
SUT VIC AB 2-0 CT1 27 (SUTURE) ×6
SUT VIC AB 2-0 CT1 TAPERPNT 27 (SUTURE) ×2 IMPLANT
TOWEL OR 17X26 10 PK STRL BLUE (TOWEL DISPOSABLE) ×3 IMPLANT
TRAY FOLEY CATH 16FRSI W/METER (SET/KITS/TRAYS/PACK) ×2 IMPLANT
WATER STERILE IRR 1500ML POUR (IV SOLUTION) ×3 IMPLANT
WRAP KNEE MAXI GEL POST OP (GAUZE/BANDAGES/DRESSINGS) ×3 IMPLANT

## 2014-05-04 NOTE — Brief Op Note (Signed)
05/04/2014  12:24 PM  PATIENT:  [name]  73 y.o. male  PRE-OPERATIVE DIAGNOSIS:  osteoarthritis right knee  POST-OPERATIVE DIAGNOSIS:  osteoarthritis right knee  PROCEDURE:  Procedure(s): RIGHT TOTAL KNEE ARTHROPLASTY (Right)  SURGEON:  Surgeon(s) and Role:    * Mcarthur Rossetti, MD - Primary  PHYSICIAN ASSISTANT: Benita Stabile, PA-C  ANESTHESIA:   regional and general  EBL:  Total I/O In: 1000 [I.V.:1000] Out: 400 [Urine:300; Blood:100]  BLOOD ADMINISTERED:none  DRAINS: none   LOCAL MEDICATIONS USED:  NONE  SPECIMEN:  No Specimen  DISPOSITION OF SPECIMEN:  N/A  COUNTS:  YES  TOURNIQUET:   Total Tourniquet Time Documented: Thigh (Right) - 60 minutes Total: Thigh (Right) - 60 minutes   DICTATION: .Other Dictation: Dictation Number [id number]  PLAN OF CARE: Admit to inpatient   PATIENT DISPOSITION:  PACU - hemodynamically stable.   Delay start of Pharmacological VTE agent (>24hrs) due to surgical blood loss or risk of bleeding: no

## 2014-05-04 NOTE — Op Note (Signed)
NAMEEDGARD, DEBORD NO.:  [id number]  MEDICAL RECORD NO.:  [id number]  LOCATION:  107                         FACILITY:  [facility]  PHYSICIAN:  Lind Guest. Ninfa Linden, M.D.DATE OF BIRTH:  02/06/1941  DATE OF PROCEDURE:  05/04/2014 DATE OF DISCHARGE:                              OPERATIVE REPORT   PREOPERATIVE DIAGNOSIS:  Primary osteoarthritis, right knee.  POSTOPERATIVE DIAGNOSIS:  Primary osteoarthritis, right knee.  PROCEDURE:  Right total knee arthroplasty.  IMPLANTS:  Stryker Triathlon knee with size 6 femur, size 6 tibial tray, 11-mm fix-bearing polyethylene insert, size 38 patellar button.  SURGEON:  Lind Guest. Ninfa Linden, M.D.  ASSISTANT:  [name], PA-C  ANESTHESIA: 1. Right leg femoral nerve block. 2. General.  TOURNIQUET TIME:  Just over 1 hour.  ANTIBIOTICS:  2 g of IV Ancef.  BLOOD LOSS:  Less than 200 mL.  COMPLICATIONS:  None.  INDICATIONS:  Mr. Kurek is a 73 year old gentleman with severe primary osteoarthritis involving his right knee.  He has failed conservative treatment.  He has actually had a previous left total knee arthroplasty for the same reasons just this past July of this year.  He has done so well with that.  He wished to proceed with a right total knee arthroplasty.  He has dealt with daily pain, decreased mobility and impact on his activities of daily living that has been negative.  He understands the risks of acute blood loss anemia, nerve and vessel injury, fracture, infection, DVT and he understands the goals are decreased pain, improved mobility, and overall improved quality of life.  PROCEDURE DESCRIPTION:  After informed consent was obtained, appropriate right leg was marked, anesthesia obtained a femoral nerve block.  He was then brought to the operating room, placed supine on the operating table.  General anesthesia was then obtained.  A Foley catheter was placed.  A nonsterile tourniquet was placed  on his upper right leg.  His right leg was then prepped and draped with DuraPrep and sterile drapes. A time-out was called and he was identified as correct patient, correct right knee.  We then used an Esmarch to wrap out the leg and tourniquet was inflated to 300 mm of pressure.  I made a direct midline incision over the patella and carried this proximally and distally.  I dissected down to the knee joint and carried out a medial parapatellar arthrotomy. I found a large joint effusion and significant cartilage loss within his knee and significant varus deformity.  Using a rongeur, we removed a lot of osteophytes from the knee and removed the medial and lateral meniscus as well as the ACL and PCL.  With the knee in a flexed position using the extramedullary cutting guide, we then assessed for 0-degree slope and correction of for varus valgus and taking 9 mm off the high side. We made this cut without difficulty.  We then went to the femur and placed a drill in the intramedullary notch area of the knee and placed an intramedullary guide set at 5 degrees right, setting our guide for distal femoral cut.  We made this distal femoral cut at 10 mm.  We brought the knee back down  in extension.  With the 9-mm extension block, he went into just slight hyperextension.  With the 11-mm block, he was in full extension.  We then went back to the femur and using the femoral sizing guide based off the epicondylar axis, we chose the size 6 femur, that was corresponded to other side.  We put the 4-in-1 cutting block on for size 6 femur and made our anterior and posterior cuts followed by our chamfer cuts.  We then made our femoral box cut.  We then went to the tibia and sized for size 6 tibia just like his other side and did our keel punch for this.  With the trial 6 tibia and the size trial 6 femur, we trialed an 11-mm polyethylene insert and I was pleased with this.  We then made our patellar cut and  drilled 3 holes for size 38 patellar button.  With all trial components in place, I was pleased with his range of motion and stability.  We then removed all trial components and copiously irrigated the knee with normal saline solution.  We mixed the cement and then cemented the real Stryker Triathlon tibia size 6 with a real Triathlon size 6 femur.  We placed the real 9-mm fixed- bearing polyethylene insert and cemented the size 38 patellar button. We removed the cement debris.  When the cement hardened with the tourniquet down, hemostasis was obtained with electrocautery.  We then irrigated the knee again with normal saline solution using pulsatile lavage.  I then closed the joint capsule/arthrotomy with #1 Vicryl suture followed by 0 Vicryl in the deep tissue, 2-0 Vicryl in the subcutaneous tissue, 4-0 Monocryl in the subcuticular stitch.  Steri- Strips on the skin and a well-padded sterile dressing was applied.  He was then awakened, extubated and taken to the recovery room in stable condition.  All final counts were correct.  There were no complications noted.  Of note, [name], PA-C assisted during the entire case and his assistance was crucial for facilitating this case at all levels.     Lind Guest. Ninfa Linden, M.D.     CYB/MEDQ  D:  05/04/2014  T:  05/04/2014  Job:  [id number]

## 2014-05-04 NOTE — Plan of Care (Signed)
Problem: Consults Goal: Diagnosis- Total Joint Replacement Primary Total Knee     

## 2014-05-04 NOTE — Anesthesia Procedure Notes (Signed)
Anesthesia Regional Block:  Femoral nerve block  Pre-Anesthetic Checklist: ,, timeout performed, Correct Patient, Correct Site, Correct Laterality, Correct Procedure, Correct Position, site marked, Risks and benefits discussed,  Surgical consent,  Pre-op evaluation,  At surgeon's request and post-op pain management  Laterality: Right  Prep: chloraprep       Needles:  Injection technique: Single-shot  Needle Type: Echogenic Needle      Needle Gauge: 20 and 20 G    Additional Needles:  Procedures: ultrasound guided (picture in chart) Femoral nerve block Narrative:  Start time: 05/04/2014 9:45 AM End time: 05/04/2014 9:50 AM  Performed by: Personally  Anesthesiologist: Yariah Selvey, md  Additional Notes: Patient tolerated the procedure well without complications. No blood aspirated. Aspiration incrementally every 5 cc or change of needle position. No paresthesias appreciated by patient. No pain on injection. Sterile technique used. Time out performed prior to procedure. Right leg confirmed by nurse and patient. Assistant: Marine scientist.

## 2014-05-04 NOTE — Anesthesia Postprocedure Evaluation (Signed)
  Anesthesia Post-op Note  Patient: [name]  Procedure(s) Performed: Procedure(s) (LRB): RIGHT TOTAL KNEE ARTHROPLASTY (Right)  Patient Location: PACU  Anesthesia Type: General  Level of Consciousness: awake and alert   Airway and Oxygen Therapy: Patient Spontanous Breathing  Post-op Pain: mild  Post-op Assessment: Post-op Vital signs reviewed, Patient's Cardiovascular Status Stable, Respiratory Function Stable, Patent Airway and No signs of Nausea or vomiting  Last Vitals:  Filed Vitals:   05/04/14 1406  BP: 150/75  Pulse: 80  Temp: 36.7 C  Resp: 14    Post-op Vital Signs: stable   Complications: No apparent anesthesia complications\

## 2014-05-04 NOTE — Transfer of Care (Signed)
Immediate Anesthesia Transfer of Care Note  Patient: [name]  Procedure(s) Performed: Procedure(s) (LRB): RIGHT TOTAL KNEE ARTHROPLASTY (Right)  Patient Location: PACU  Anesthesia Type: General  Level of Consciousness: sedated, patient cooperative and responds to stimulation  Airway & Oxygen Therapy: Patient Spontanous Breathing and Patient connected to face mask oxgen  Post-op Assessment: Report given to PACU RN and Post -op Vital signs reviewed and stable  Post vital signs: Reviewed and stable  Complications: No apparent anesthesia complications

## 2014-05-04 NOTE — Progress Notes (Signed)
Utilization review completed.  

## 2014-05-04 NOTE — H&P (Signed)
TOTAL KNEE ADMISSION H&P  Patient is being admitted for right total knee arthroplasty.  Subjective:  Chief Complaint:right knee pain.  HPI: [name], 73 y.o. male, has a history of pain and functional disability in the right knee due to primary osteo- arthritis and has failed non-surgical conservative treatments for greater than 12 weeks to includeNSAID's and/or analgesics, corticosteriod injections, flexibility and strengthening excercises, use of assistive devices, weight reduction as appropriate and activity modification.  Onset of symptoms was gradual, starting 3 years ago with gradually worsening course since that time. The patient noted no past surgery on the right knee(s).  Patient currently rates pain in the right knee(s) at 10 out of 10 with activity. Patient has night pain, worsening of pain with activity and weight bearing, pain that interferes with activities of daily living, pain with passive range of motion, crepitus and joint swelling.  Patient has evidence of subchondral sclerosis, periarticular osteophytes and joint space narrowing by imaging studies. There is no active infection.  Patient Active Problem List   Diagnosis Date Noted  . Arthritis of knee, right, primary OA 05/04/2014  . Arthritis of knee, left 01/05/2014  . Status post total knee replacement 01/05/2014  . Barrett's esophagus 09/01/2012  . Chest discomfort + dyspnea 09/01/2012  . Hypertension   . Hyperlipidemia   . Hypothyroidism   . Bradycardia   . COLONIC POLYPS, ADENOMATOUS 04/12/2009  . GASTROESOPHAGEAL REFLUX DISEASE, CHRONIC 04/11/2009   Past Medical History  Diagnosis Date  . Hypertension   . Hyperlipidemia   . Gout   . Hypothyroidism     Following partial thyroidectomy  . Hx of adenomatous colonic polyps   . Barrett's esophagus   . Bradycardia 2009    Sinus rhythm in the 40s without symptoms;  . 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 Arthritis    "fingers; knees; toes" (01/05/2014)  . Squamous carcinoma     skin cancer benign arms and right ear and rightside lip  . History of skin cancer   . GERD (gastroesophageal reflux disease)     occasional - no meds  . BPH (benign prostatic hyperplasia)     Past Surgical History  Procedure Laterality Date  . Thyroidectomy, partial Right 1969    lobe-nodule present  . Abdominal hernia repair  1990's  . Esophagogastroduodenoscopy  01/2006    geographic erosive RE, noncritical peptic stricture s/p dilation, antral erosion  . Colonoscopy  12/2005    normal  . Colonoscopy  2004    tubulovillous adenoma of rectum   . Esophagogastroduodenoscopy  01/12/2011    Dr. Gala Romney- hiatal hernia, distal esophageal erosions, barretts esophagus  . Maloney dilation  01/12/2011    Procedure: [name] DILATION;  Surgeon: Daneil Dolin, MD;  Location: [facility];  Service: Endoscopy;  Laterality: N/A;  56 french  . Colonoscopy  01/12/2011    Dr. Gala Romney- normal rectum  . Esophagogastroduodenoscopy  01/27/2012    Procedure: ESOPHAGOGASTRODUODENOSCOPY (EGD);  Surgeon: Daneil Dolin, MD;  Location: [facility];  Service: Endoscopy;  Laterality: N/A;  12:30  . Cataract extraction w/ intraocular lens  implant, bilateral Bilateral 2012-2014  . Hernia repair    . Umbilical hernia repair  2000  . Elbow arthroscopy Left 2014  . Squamous cell carcinoma excision      "cut off my hands; left arm; under right lip; right ear" (01/05/2014)  . Total knee arthroplasty Left 01/05/2014    Procedure:  LEFT TOTAL KNEE ARTHROPLASTY;  Surgeon: Mcarthur Rossetti, MD;  Location: [facility];  Service: Orthopedics;  Laterality: Left;    No prescriptions prior to admission   No Known Allergies  History  Substance Use Topics  . Smoking status: Former Smoker -- 1.00 packs/day for 20 years    Types: Cigarettes  . Smokeless tobacco: Never Used     Comment: "quit smoking in ~ 1982"  . Alcohol Use: 0.6 oz/week    1 Cans of beer per week      Comment: rare    Family History  Problem Relation Age of Onset  . Colon cancer Neg Hx   . Liver disease Neg Hx   . Lung cancer Father 50  . Colon polyps Brother      Review of Systems  Musculoskeletal: Positive for joint pain.  All other systems reviewed and are negative.   Objective:  Physical Exam  Constitutional: He is oriented to person, place, and time. He appears well-developed and well-nourished.  HENT:  Head: Normocephalic and atraumatic.  Eyes: EOM are normal. Pupils are equal, round, and reactive to light.  Neck: Normal range of motion. Neck supple.  Cardiovascular: Normal rate and regular rhythm.   Respiratory: Effort normal and breath sounds normal.  GI: Soft. Bowel sounds are normal.  Musculoskeletal:       Right knee: He exhibits decreased range of motion, swelling and abnormal alignment. Tenderness found. Medial joint line and lateral joint line tenderness noted.  Neurological: He is alert and oriented to person, place, and time.  Skin: Skin is warm and dry.  Psychiatric: He has a normal mood and affect.    Vital signs in last 24 hours:    Labs:   Estimated body mass index is 35.35 kg/(m^2) as calculated from the following:   Height as of 12/26/13: 5\' 9"  (1.753 m).   Weight as of 12/26/13: 108.636 kg (239 lb 8 oz).   Imaging Review Plain radiographs demonstrate severe degenerative joint disease of the right knee(s). The overall alignment ismild varus. The bone quality appears to be good for age and reported activity level.  Assessment/Plan:  End stage arthritis, right knee   The patient history, physical examination, clinical judgment of the provider and imaging studies are consistent with end stage degenerative joint disease of the right knee(s) and total knee arthroplasty is deemed medically necessary. The treatment options including medical management, injection therapy arthroscopy and arthroplasty were discussed at length. The risks and benefits of  total knee arthroplasty were presented and reviewed. The risks due to aseptic loosening, infection, stiffness, patella tracking problems, thromboembolic complications and other imponderables were discussed. The patient acknowledged the explanation, agreed to proceed with the plan and consent was signed. Patient is being admitted for inpatient treatment for surgery, pain control, PT, OT, prophylactic antibiotics, VTE prophylaxis, progressive ambulation and ADL's and discharge planning. The patient is planning to be discharged home with home health services

## 2014-05-04 NOTE — Evaluation (Signed)
Physical Therapy Evaluation Patient Details Name: [name] MRN: [id number] DOB: September 15, 1940 Today's Date: 05/04/2014   History of Present Illness  R TKR  Clinical Impression  Pt s/p R TKR presents with decreased R LE strength/ROM and post op pain limiting functional mobility.  Pt should progress well to d/c home with family assist and HHPT follow up.     Follow Up Recommendations Home health PT    Equipment Recommendations  None recommended by PT    Recommendations for Other Services OT consult     Precautions / Restrictions Precautions Precautions: Knee;Fall Required Braces or Orthoses: Knee Immobilizer - Right Knee Immobilizer - Right: Discontinue once straight leg raise with < 10 degree lag Restrictions Weight Bearing Restrictions: No Other Position/Activity Restrictions: WBAT      Mobility  Bed Mobility Overal bed mobility: Needs Assistance Bed Mobility: Supine to Sit     Supine to sit: Min assist;Mod assist     General bed mobility comments: cues for sequence and use of L LE to self assist  Transfers Overall transfer level: Needs assistance Equipment used: Rolling walker (2 wheeled) Transfers: Sit to/from Stand Sit to Stand: Min assist;Mod assist         General transfer comment: cues for LE management and use of UEs to self assist  Ambulation/Gait Ambulation/Gait assistance: Min assist Ambulation Distance (Feet): 40 Feet Assistive device: Rolling walker (2 wheeled) Gait Pattern/deviations: Step-to pattern;Shuffle     General Gait Details: cues for posture, sequence and position from [employer]            Wheelchair Mobility    Modified Rankin (Stroke Patients Only)       Balance                                             Pertinent Vitals/Pain Pain Assessment: 0-10 Pain Score: 3  Pain Location: R knee Pain Descriptors / Indicators: Aching Pain Intervention(s): Limited activity within patient's  tolerance;Monitored during session;Premedicated before session;Ice applied    Home Living Family/patient expects to be discharged to:: Private residence Living Arrangements: Spouse/significant other Available Help at Discharge: Friend(s);Available 24 hours/day Type of Home: House Home Access: Stairs to enter   [employer] of Steps: 1 Home Layout: One level Home Equipment: Walker - 2 wheels;Bedside commode;Shower seat      Prior Function Level of Independence: Independent               Hand Dominance   Dominant Hand: Right    Extremity/Trunk Assessment   Upper Extremity Assessment: Overall WFL for tasks assessed           Lower Extremity Assessment: RLE deficits/detail RLE Deficits / Details: 2+/5 quad strength with AAROM at knee -10 - 45    Cervical / Trunk Assessment: Normal  Communication   Communication: No difficulties  Cognition Arousal/Alertness: Awake/alert Behavior During Therapy: WFL for tasks assessed/performed Overall Cognitive Status: Within Functional Limits for tasks assessed                      General Comments      Exercises Total Joint Exercises Ankle Circles/Pumps: AROM;Both;10 reps;Supine Quad Sets: AROM;Both;10 reps;Supine Heel Slides: AAROM;Right;10 reps;Supine Straight Leg Raises: AAROM;Right;10 reps;Supine      Assessment/Plan    PT Assessment Patient needs continued PT services  PT Diagnosis Difficulty walking  PT Problem List Decreased strength;Decreased range of motion;Decreased activity tolerance;Decreased mobility;Decreased knowledge of use of DME;Pain;Obesity  PT Treatment Interventions DME instruction;Gait training;Stair training;Functional mobility training;Therapeutic activities;Therapeutic exercise;Patient/family education   PT Goals (Current goals can be found in the Care Plan section) Acute Rehab PT Goals Patient Stated Goal: Resume previous lifestyle with decreased pain PT Goal Formulation:  With patient Time For Goal Achievement: 05/08/14 Potential to Achieve Goals: Good    Frequency 7X/week   Barriers to discharge        Co-evaluation               End of Session Equipment Utilized During Treatment: Gait belt;Right knee immobilizer Activity Tolerance: Patient tolerated treatment well Patient left: in chair;with call bell/phone within reach Nurse Communication: Mobility status         Time: 1025-8527 PT Time Calculation (min): 34 min   Charges:   PT Evaluation $Initial PT Evaluation Tier I: 1 Procedure PT Treatments $Gait Training: 8-22 mins $Therapeutic Exercise: 8-22 mins   PT G Codes:          Lynwood Kubisiak 05/04/2014, 4:58 PM

## 2014-05-05 LAB — BASIC METABOLIC PANEL
Anion gap: 12 (ref 5–15)
BUN: 15 mg/dL (ref 6–23)
CO2: 25 mEq/L (ref 19–32)
Calcium: 8.8 mg/dL (ref 8.4–10.5)
Chloride: 101 mEq/L (ref 96–112)
Creatinine, Ser: 1.09 mg/dL (ref 0.50–1.35)
GFR calc Af Amer: 76 mL/min — ABNORMAL LOW (ref >90)
GFR calc non Af Amer: 65 mL/min — ABNORMAL LOW (ref >90)
Glucose, Bld: 146 mg/dL — ABNORMAL HIGH (ref 70–99)
Potassium: 4.2 mEq/L (ref 3.7–5.3)
Sodium: 138 mEq/L (ref 137–147)

## 2014-05-05 LAB — CBC
HCT: 34.8 % — ABNORMAL LOW (ref 39.0–52.0)
Hemoglobin: 11.5 g/dL — ABNORMAL LOW (ref 13.0–17.0)
MCH: 28.9 pg (ref 26.0–34.0)
MCHC: 33 g/dL (ref 30.0–36.0)
MCV: 87.4 fL (ref 78.0–100.0)
Platelets: 193 10*3/uL (ref 150–400)
RBC: 3.98 MIL/uL — ABNORMAL LOW (ref 4.22–5.81)
RDW: 12.8 % (ref 11.5–15.5)
WBC: 14.2 10*3/uL — ABNORMAL HIGH (ref 4.0–10.5)

## 2014-05-05 NOTE — Discharge Instructions (Addendum)
Information on my medicine - XARELTO (Rivaroxaban)  This medication education was reviewed with me or my healthcare representative as part of my discharge preparation.  The pharmacist that spoke with me during my hospital stay was:  Minda Ditto, [facility]  Why was Xarelto prescribed for you? Xarelto was prescribed for you to reduce the risk of blood clots forming after orthopedic surgery. The medical term for these abnormal blood clots is venous thromboembolism (VTE).  What do you need to know about xarelto ? Take your Xarelto ONCE DAILY at the same time every day. You may take it either with or without food.  If you have difficulty swallowing the tablet whole, you may crush it and mix in applesauce just prior to taking your dose.  Take Xarelto exactly as prescribed by your doctor and DO NOT stop taking Xarelto without talking to the doctor who prescribed the medication.  Stopping without other VTE prevention medication to take the place of Xarelto may increase your risk of developing a clot.  After discharge, you should have regular check-up appointments with your healthcare provider that is prescribing your Xarelto.    What do you do if you miss a dose? If you miss a dose, take it as soon as you remember on the same day then continue your regularly scheduled once daily regimen the next day. Do not take two doses of Xarelto on the same day.   Important Safety Information A possible side effect of Xarelto is bleeding. You should call your healthcare provider right away if you experience any of the following: ? Bleeding from an injury or your nose that does not stop. ? Unusual colored urine (red or dark brown) or unusual colored stools (red or black). ? Unusual bruising for unknown reasons. ? A serious fall or if you hit your head (even if there is no bleeding).  Some medicines may interact with Xarelto and might increase your risk of bleeding while on Xarelto. To help avoid this,  consult your healthcare provider or pharmacist prior to using any new prescription or non-prescription medications, including herbals, vitamins, non-steroidal anti-inflammatory drugs (NSAIDs) and supplements.  This website has more information on Xarelto: [email or web address]   Pickup stool softener for constipation. Right lower extremity Weight Bearing as tolerated  Progress activities slowly Expect  right knee soreness and swelling. Apply heat or ice as needed. Keep  Right Knee dressing clean dry and intact, may shower with dressing intact.

## 2014-05-05 NOTE — Progress Notes (Signed)
Physical Therapy Treatment Patient Details Name: [name] MRN: [id number] DOB: 10/24/1940 Today's Date: 05/05/2014    History of Present Illness R TKR    PT Comments    Progressing well  Follow Up Recommendations  Home health PT     Equipment Recommendations  None recommended by PT    Recommendations for Other Services OT consult     Precautions / Restrictions Precautions Precautions: Knee;Fall Required Braces or Orthoses: Knee Immobilizer - Right Knee Immobilizer - Right: Discontinue once straight leg raise with < 10 degree lag Restrictions Weight Bearing Restrictions: No Other Position/Activity Restrictions: WBAT    Mobility  Bed Mobility Overal bed mobility: Needs Assistance Bed Mobility: Supine to Sit     Supine to sit: Min assist     General bed mobility comments: cues for sequence and use of L LE to self assist  Transfers Overall transfer level: Needs assistance Equipment used: Rolling walker (2 wheeled) Transfers: Sit to/from Stand Sit to Stand: Min assist         General transfer comment: cues for LE management and use of UEs to self assist  Ambulation/Gait Ambulation/Gait assistance: Min assist Ambulation Distance (Feet): 138 Feet Assistive device: Rolling walker (2 wheeled) Gait Pattern/deviations: Step-to pattern;Decreased step length - right;Decreased step length - left;Shuffle;Trunk flexed     General Gait Details: cues for posture, sequence and position from [employer]            Wheelchair Mobility    Modified Rankin (Stroke Patients Only)       Balance                                    Cognition Arousal/Alertness: Awake/alert Behavior During Therapy: WFL for tasks assessed/performed Overall Cognitive Status: Within Functional Limits for tasks assessed                      Exercises Total Joint Exercises Ankle Circles/Pumps: AROM;Both;10 reps;Supine Quad Sets: AROM;Both;10  reps;Supine Heel Slides: AAROM;Right;Supine;15 reps Straight Leg Raises: AAROM;Right;Supine;15 reps Goniometric ROM: AAROM R knee -10 - 50    General Comments        Pertinent Vitals/Pain Pain Assessment: 0-10 Pain Score: 3  Pain Location: R knee Pain Descriptors / Indicators: Aching Pain Intervention(s): Limited activity within patient's tolerance;Monitored during session;Premedicated before session;Ice applied    Home Living                      Prior Function            PT Goals (current goals can now be found in the care plan section) Acute Rehab PT Goals Patient Stated Goal: Resume previous lifestyle with decreased pain PT Goal Formulation: With patient Time For Goal Achievement: 05/08/14 Potential to Achieve Goals: Good Progress towards PT goals: Progressing toward goals    Frequency  7X/week    PT Plan Current plan remains appropriate    Co-evaluation             End of Session Equipment Utilized During Treatment: Gait belt;Right knee immobilizer Activity Tolerance: Patient tolerated treatment well Patient left: in chair;with call bell/phone within reach     Time: 0815-0841 PT Time Calculation (min): 26 min  Charges:  $Gait Training: 8-22 mins $Therapeutic Exercise: 8-22 mins  G Codes:      Hilary Pundt 06-02-14, 9:39 AM

## 2014-05-05 NOTE — Progress Notes (Signed)
Physical Therapy Treatment Patient Details Name: [name] MRN: [id number] DOB: 05-21-1941 Today's Date: 05/05/2014    History of Present Illness R TKR    PT Comments      Follow Up Recommendations  Home health PT     Equipment Recommendations  None recommended by PT    Recommendations for Other Services OT consult     Precautions / Restrictions Precautions Precautions: Knee;Fall Required Braces or Orthoses: Knee Immobilizer - Right Knee Immobilizer - Right: Discontinue once straight leg raise with < 10 degree lag Restrictions Weight Bearing Restrictions: No Other Position/Activity Restrictions: WBAT    Mobility  Bed Mobility Overal bed mobility: Needs Assistance Bed Mobility: Sit to Supine       Sit to supine: Min guard   General bed mobility comments: cues for sequence and use of L LE to self assist  Transfers Overall transfer level: Needs assistance Equipment used: Rolling walker (2 wheeled) Transfers: Sit to/from Stand Sit to Stand: Min guard         General transfer comment: cues for LE management and use of UEs to self assist  Ambulation/Gait Ambulation/Gait assistance: Min guard Ambulation Distance (Feet): 150 Feet Assistive device: Rolling walker (2 wheeled) Gait Pattern/deviations: Step-to pattern;Step-through pattern;Decreased step length - right;Decreased step length - left;Shuffle;Trunk flexed     General Gait Details: cues for posture, sequence and position from [employer]            Wheelchair Mobility    Modified Rankin (Stroke Patients Only)       Balance                                    Cognition Arousal/Alertness: Awake/alert Behavior During Therapy: WFL for tasks assessed/performed Overall Cognitive Status: Within Functional Limits for tasks assessed                      Exercises Total Joint Exercises Ankle Circles/Pumps: AROM;Both;10 reps;Supine Quad Sets: AROM;Both;10  reps;Supine Heel Slides: AAROM;Right;Supine;15 reps Straight Leg Raises: AAROM;Right;Supine;15 reps    General Comments        Pertinent Vitals/Pain Pain Assessment: 0-10 Pain Score: 3  Pain Location: R knee Pain Descriptors / Indicators: Aching Pain Intervention(s): Limited activity within patient's tolerance;Monitored during session;Patient requesting pain meds-RN notified    Home Living                      Prior Function            PT Goals (current goals can now be found in the care plan section) Acute Rehab PT Goals Patient Stated Goal: Resume previous lifestyle with decreased pain PT Goal Formulation: With patient Time For Goal Achievement: 05/08/14 Potential to Achieve Goals: Good Progress towards PT goals: Progressing toward goals    Frequency  7X/week    PT Plan Current plan remains appropriate    Co-evaluation             End of Session Equipment Utilized During Treatment: Gait belt;Right knee immobilizer Activity Tolerance: Patient tolerated treatment well Patient left: with call bell/phone within reach;in bed     Time: 1333-1400 PT Time Calculation (min): 27 min  Charges:  $Gait Training: 8-22 mins $Therapeutic Exercise: 8-22 mins                    G Codes:  [name] 05/05/2014, 2:50 PM

## 2014-05-05 NOTE — Progress Notes (Signed)
OT Cancellation Note  Patient Details Name: [name] MRN: [id number] DOB: 06-Dec-1940   Cancelled Treatment:    Reason Eval/Treat Not Completed: OT screened, no needs identified, will sign off.    Betsy Pries 05/05/2014, 2:02 PM

## 2014-05-05 NOTE — Progress Notes (Signed)
Subjective: 1 Day Post-Op Procedure(s) (LRB): RIGHT TOTAL KNEE ARTHROPLASTY (Right) Patient reports pain as moderate.  Already has made progress with therapy.  Objective: Vital signs in last 24 hours: Temp:  [97.6 F (36.4 C)-98.6 F (37 C)] 97.8 F (36.6 C) (10/31 0427) Pulse Rate:  [52-93] 68 (10/31 0427) Resp:  [10-22] 14 (10/31 0427) BP: (126-154)/(66-86) 126/72 mmHg (10/31 0427) SpO2:  [90 %-100 %] 98 % (10/31 0427) Weight:  [107.502 kg (237 lb)] 107.502 kg (237 lb) (10/30 1410)  Intake/Output from previous day: 10/30 0701 - 10/31 0700 In: 4147.5 [P.O.:1060; I.V.:2877.5; IV Piggyback:210] Out: 3250 [Urine:3150; Blood:100] Intake/Output this shift:     Recent Labs  05/05/14 0455  HGB 11.5*    Recent Labs  05/05/14 0455  WBC 14.2*  RBC 3.98*  HCT 34.8*  PLT 193    Recent Labs  05/05/14 0455  NA 138  K 4.2  CL 101  CO2 25  BUN 15  CREATININE 1.09  GLUCOSE 146*  CALCIUM 8.8   No results found for this basename: LABPT, INR,  in the last 72 hours  Sensation intact distally Intact pulses distally Dorsiflexion/Plantar flexion intact Incision: dressing C/D/I Compartment soft  Assessment/Plan: 1 Day Post-Op Procedure(s) (LRB): RIGHT TOTAL KNEE ARTHROPLASTY (Right) Up with therapy Plan for discharge tomorrow  Mcarthur Rossetti 05/05/2014, 8:15 AM

## 2014-05-06 MED ORDER — OXYCODONE-ACETAMINOPHEN 5-325 MG PO TABS: 1.0000 | ORAL_TABLET | ORAL | Status: DC | PRN

## 2014-05-06 MED ORDER — RIVAROXABAN 10 MG PO TABS: 10.0000 mg | ORAL_TABLET | Freq: Every day | ORAL | Status: DC

## 2014-05-06 MED ORDER — DSS 100 MG PO CAPS: 100.0000 mg | ORAL_CAPSULE | Freq: Two times a day (BID) | ORAL | Status: DC

## 2014-05-06 NOTE — Plan of Care (Signed)
Problem: Phase III Progression Outcomes Goal: Pain controlled on oral analgesia Outcome: Completed/Met Date Met:  05/06/14 Goal: Ambulates Outcome: Completed/Met Date Met:  05/06/14 Goal: Incision clean - minimal/no drainage Outcome: Completed/Met Date Met:  05/06/14 Goal: Discharge plan remains appropriate-arrangements made Outcome: Completed/Met Date Met:  05/06/14 Goal: Anticoagulant follow-up in place Outcome: Completed/Met Date Met:  05/06/14 Goal: Other Phase III Outcomes/Goals Outcome: Not Applicable Date Met:  38/17/71  Problem: Discharge Progression Outcomes Goal: Barriers To Progression Addressed/Resolved Outcome: Completed/Met Date Met:  05/06/14 Goal: CMS/Neurovascular status at or above baseline Outcome: Completed/Met Date Met:  05/06/14 Goal: Anticoagulant follow-up in place Outcome: Completed/Met Date Met:  05/06/14 Goal: Pain controlled with appropriate interventions Outcome: Completed/Met Date Met:  05/06/14 Goal: Hemodynamically stable Outcome: Completed/Met Date Met:  16/57/90 Goal: Complications resolved/controlled Outcome: Completed/Met Date Met:  05/06/14 Goal: Tolerates diet Outcome: Completed/Met Date Met:  05/06/14 Goal: Activity appropriate for discharge plan Outcome: Completed/Met Date Met:  05/06/14 Goal: Ambulates safely using assistive device Outcome: Completed/Met Date Met:  05/06/14 Goal: Follows weight - bearing limitations Outcome: Completed/Met Date Met:  05/06/14 Goal: Discharge plan in place and appropriate Outcome: Completed/Met Date Met:  05/06/14 Goal: Negotiates stairs Outcome: Completed/Met Date Met:  05/06/14 Goal: Demonstrates ADLs as appropriate Outcome: Completed/Met Date Met:  05/06/14 Goal: Incision without S/S infection Outcome: Completed/Met Date Met:  05/06/14 Goal: Other Discharge Outcomes/Goals Outcome: Not Applicable Date Met:  38/33/38

## 2014-05-06 NOTE — Progress Notes (Signed)
Subjective: 2 Days Post-Op Procedure(s) (LRB): RIGHT TOTAL KNEE ARTHROPLASTY (Right) Patient reports pain as moderate.  No complaints. Wanting to go home today.  Objective: Vital signs in last 24 hours: Temp:  [98.2 F (36.8 C)-100.2 F (37.9 C)] 98.2 F (36.8 C) (11/01 0635) Pulse Rate:  [79-84] 79 (11/01 0635) Resp:  [16-20] 20 (11/01 0635) BP: (122-139)/(60-74) 134/66 mmHg (11/01 0635) SpO2:  [96 %-98 %] 96 % (11/01 0635)  Intake/Output from previous day: 10/31 0701 - 11/01 0700 In: 1320 [P.O.:1320] Out: 1101 [Urine:1100; Stool:1] Intake/Output this shift:     Recent Labs  05/05/14 0455  HGB 11.5*    Recent Labs  05/05/14 0455  WBC 14.2*  RBC 3.98*  HCT 34.8*  PLT 193    Recent Labs  05/05/14 0455  NA 138  K 4.2  CL 101  CO2 25  BUN 15  CREATININE 1.09  GLUCOSE 146*  CALCIUM 8.8   No results for input(s): LABPT, INR in the last 72 hours.  Neurovascular intact Dorsiflexion/Plantar flexion intact Incision: dressing C/D/I Compartment soft  Assessment/Plan: 2 Days Post-Op Procedure(s) (LRB): RIGHT TOTAL KNEE ARTHROPLASTY (Right) Up with therapy Discharge home with home health  Dressing changed   [name] 05/06/2014, 8:49 AM

## 2014-05-06 NOTE — Discharge Summary (Signed)
Patient ID: [name] MRN: [id number] DOB/AGE: 03-07-1941 73 y.o.  Admit date: 05/04/2014 Discharge date: 05/06/2014  Admission Diagnoses:  Principal Problem:   Arthritis of knee, right, primary OA Active Problems:   Status post total right knee replacement   Discharge Diagnoses:  Same  Past Medical History  Diagnosis Date  . Hypertension   . Hyperlipidemia   . Gout   . Hypothyroidism     Following partial thyroidectomy  . Hx of adenomatous colonic polyps   . Barrett's esophagus   . Bradycardia 2009    Sinus rhythm in the 40s without symptoms;  . 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 Arthritis     "fingers; knees; toes" (01/05/2014)  . Squamous carcinoma     skin cancer benign arms and right ear and rightside lip  . History of skin cancer   . GERD (gastroesophageal reflux disease)     occasional - no meds  . BPH (benign prostatic hyperplasia)     Surgeries: Procedure(s): RIGHT TOTAL KNEE ARTHROPLASTY on 05/04/2014   Consultants:    Discharged Condition: Improved  Hospital Course: [name] is an 73 y.o. male who was admitted 05/04/2014 for operative treatment ofArthritis of knee, right. Patient has severe unremitting pain that affects sleep, daily activities, and work/hobbies. After pre-op clearance the patient was taken to the operating room on 05/04/2014 and underwent  Procedure(s): RIGHT TOTAL KNEE ARTHROPLASTY.    Patient was given perioperative antibiotics: Anti-infectives    Start     Dose/Rate Route Frequency Ordered Stop   05/04/14 1700  ceFAZolin (ANCEF) IVPB 1 g/50 mL premix     1 g100 mL/hr over 30 Minutes Intravenous Every 6 hours 05/04/14 1416 05/04/14 2331   05/04/14 0813  ceFAZolin (ANCEF) IVPB 2 g/50 mL premix     2 g100 mL/hr over 30 Minutes Intravenous On call to O.R. 05/04/14 0813 05/04/14 1051       Patient was given sequential compression devices, early ambulation, and chemoprophylaxis to  prevent DVT.  Patient benefited maximally from hospital stay and there were no complications.    Recent vital signs: Patient Vitals for the past 24 hrs:  BP Temp Temp src Pulse Resp SpO2  05/06/14 0635 134/66 mmHg 98.2 F (36.8 C) Oral 79 20 96 %  05/06/14 0400 - - - - 18 -  05/06/14 0130 - 98.3 F (36.8 C) Oral - - -  05/05/14 1884 - - - - 18 -  05/05/14 2135 122/66 mmHg 100.2 F (37.9 C) Oral - - -  05/05/14 2025 136/74 mmHg 99 F (37.2 C) Oral 83 20 98 %  05/05/14 2000 - - - - 18 98 %  05/05/14 1445 139/60 mmHg 98.4 F (36.9 C) Oral 84 16 96 %     Recent laboratory studies:  Recent Labs  05/05/14 0455  WBC 14.2*  HGB 11.5*  HCT 34.8*  PLT 193  NA 138  K 4.2  CL 101  CO2 25  BUN 15  CREATININE 1.09  GLUCOSE 146*  CALCIUM 8.8     Discharge Medications:     Medication List    TAKE these medications        amLODipine 10 MG tablet  Commonly known as:  NORVASC  Take 10 mg by mouth every morning.     DSS 100 MG Caps  Take 100 mg by mouth 2 (two) times daily.     levothyroxine 150 MCG  tablet  Commonly known as:  SYNTHROID, LEVOTHROID  Take 150 mcg by mouth daily before breakfast.     lisinopril-hydrochlorothiazide 20-12.5 MG per tablet  Commonly known as:  PRINZIDE,ZESTORETIC  Take 1 tablet by mouth 2 (two) times daily.     oxyCODONE-acetaminophen 5-325 MG per tablet  Commonly known as:  ROXICET  Take 1-2 tablets by mouth every 4 (four) hours as needed for severe pain.     rivaroxaban 10 MG Tabs tablet  Commonly known as:  XARELTO  Take 1 tablet (10 mg total) by mouth daily with breakfast.     tamsulosin 0.4 MG Caps capsule  Commonly known as:  FLOMAX  Take 0.4 mg by mouth at bedtime.        Diagnostic Studies: Dg Knee Right Port  05/04/2014   CLINICAL DATA:  Initial encounter for right total knee replacement  EXAM: PORTABLE RIGHT KNEE - 1-2 VIEW  COMPARISON:  None.  FINDINGS: Two view exam shows the patient to be status post tricompartmental  knee replacement. No evidence for immediate hardware complications. Gas in the soft tissues around the knee is compatible with the immediate postoperative state.  IMPRESSION: Status post right total knee replacement without complicating features.   Electronically Signed   By: Misty Stanley M.D.   On: 05/04/2014 13:25    Disposition: 06-Home-Health Care Svc      Discharge Instructions    Weight bearing as tolerated    Complete by:  As directed               Signed: [name] 05/06/2014, 8:47 AM

## 2014-05-06 NOTE — Progress Notes (Signed)
CARE MANAGEMENT NOTE 05/06/2014  Patient:  QUAY, SIMKIN   Account Number:  [id number]  Date Initiated:  05/06/2014  Documentation initiated by:  [facility]  Subjective/Objective Assessment:   RIGHT TOTAL KNEE ARTHROPLASTY     Action/Plan:   home with wife   Anticipated DC Date:  05/06/2014   Anticipated DC Plan:  [place]  CM consult      [facility] Choice  HOME HEALTH   Choice offered to / List presented to:  C-1 Patient        [place] arranged  HH-2 PT      [place] agency  [place]   Status of service:  Completed, signed off Medicare Important Message given?  YES (If response is "NO", the following Medicare IM given date fields will be blank) Date Medicare IM given:  05/06/2014 Medicare IM given by:  [facility] Date Additional Medicare IM given:   Additional Medicare IM given by:    Discharge Disposition:  [place]  Per UR Regulation:    If discussed at Long Length of Stay Meetings, dates discussed:    Comments:  05/06/2014 10:23 AM NCM spoke to pt and he is requesting [facility] for [facility] PT. Had agency last surgery. Has RW and 3n1 at home. Pt states [employer] will deliver his CPM machine on Monday. Has phone number to contact [employer] to follow up on DME. [place] and spoke to [employer]. Requested fax orders to [phone] Del[employer] Ovens, Intake Coordinator. Pt has contact info for [facility]. Jonnie Finner RN CCM Case Mgmt phone [phone]

## 2014-05-06 NOTE — Progress Notes (Signed)
NCM spoke to pt and he is requesting [facility] for [facility] PT. Had agency last surgery. Has RW and 3n1 at home. Pt states [employer] will deliver his CPM machine on Monday. Has phone number to contact [employer] to follow up on DME. [place] and spoke to [employer]. Requested fax orders to [phone] Del[employer] Ovens, Intake Coordinator. Pt has contact info for [facility]. Jonnie Finner RN CCM Case Mgmt phone [phone]

## 2014-05-06 NOTE — Progress Notes (Signed)
Physical Therapy Treatment Patient Details Name: [name] MRN: [id number] DOB: [birth year] Today's Date: 05/06/2014    History of Present Illness R TKR    PT Comments    Reviewed stairs with written instructions provided.  Follow Up Recommendations  Home health PT     Equipment Recommendations  None recommended by PT    Recommendations for Other Services OT consult     Precautions / Restrictions Precautions Precautions: Knee;Fall Required Braces or Orthoses: Knee Immobilizer - Right (pt performed IND SLR this am) Knee Immobilizer - Right: Discontinue once straight leg raise with < 10 degree lag Restrictions Weight Bearing Restrictions: No Other Position/Activity Restrictions: WBAT    Mobility  Bed Mobility Overal bed mobility: Needs Assistance Bed Mobility: Supine to Sit     Supine to sit: Min assist     General bed mobility comments: min assist with R LE  Transfers Overall transfer level: Needs assistance Equipment used: Rolling walker (2 wheeled) Transfers: Sit to/from Stand Sit to Stand: Min guard;Supervision         General transfer comment: cues for LE management and use of UEs to self assist  Ambulation/Gait Ambulation/Gait assistance: Min guard;Supervision Ambulation Distance (Feet): 150 Feet Assistive device: Rolling walker (2 wheeled) Gait Pattern/deviations: Step-to pattern;Decreased step length - right;Decreased step length - left;Shuffle;Trunk flexed     General Gait Details: cues for posture, sequence and position from RW   Stairs Stairs: Yes Stairs assistance: Min assist Stair Management: No rails;Step to pattern;Backwards;Forwards;With walker Number of Stairs: 4 General stair comments: 1 step fwd with RW but pt struggling with wt on R LE.  Single step 3 times bkwd with RW and cues to ingrain pattern.  Written instructions provided  Wheelchair Mobility    Modified Rankin (Stroke Patients Only)       Balance                                     Cognition Arousal/Alertness: Awake/alert Behavior During Therapy: WFL for tasks assessed/performed Overall Cognitive Status: Within Functional Limits for tasks assessed                      Exercises Total Joint Exercises Ankle Circles/Pumps: AROM;Both;Supine;15 reps Quad Sets: AROM;Both;Supine;20 reps Heel Slides: AAROM;Right;Supine;20 reps Straight Leg Raises: Right;Supine;20 reps;AAROM;AROM    General Comments        Pertinent Vitals/Pain Pain Assessment: 0-10 Pain Score: 3  Pain Location: R knee Pain Descriptors / Indicators: Sore Pain Intervention(s): Limited activity within patient's tolerance;Monitored during session;Premedicated before session;Ice applied    Home Living                      Prior Function            PT Goals (current goals can now be found in the care plan section) Acute Rehab PT Goals Patient Stated Goal: Resume previous lifestyle with decreased pain PT Goal Formulation: With patient Time For Goal Achievement: 05/08/14 Potential to Achieve Goals: Good Progress towards PT goals: Progressing toward goals    Frequency  7X/week    PT Plan Current plan remains appropriate    Co-evaluation             End of Session Equipment Utilized During Treatment: Gait belt Activity Tolerance: Patient tolerated treatment well Patient left: in chair;with call bell/phone within reach;with family/visitor present     Time:  2257-5051 PT Time Calculation (min): 38 min  Charges:  $Gait Training: 8-22 mins $Therapeutic Exercise: 8-22 mins $Therapeutic Activity: 8-22 mins                    G Codes:      Rolly Magri 05/23/2014, 2:55 PM

## 2014-05-07 ENCOUNTER — Encounter (HOSPITAL_COMMUNITY): Payer: Self-pay | Admitting: Orthopaedic Surgery

## 2014-05-23 ENCOUNTER — Ambulatory Visit (HOSPITAL_COMMUNITY)
Admission: RE | Admit: 2014-05-23 | Discharge: 2014-05-23 | Disposition: A | Discharge status: Home / Self Care | Payer: Medicare Other | Source: Ambulatory Visit | Attending: Orthopaedic Surgery | Admitting: Orthopaedic Surgery

## 2014-05-23 ENCOUNTER — Other Ambulatory Visit (HOSPITAL_COMMUNITY): Payer: Self-pay | Admitting: Orthopaedic Surgery

## 2014-05-23 DIAGNOSIS — M79604 Pain in right leg: Secondary | ICD-10-CM | POA: Diagnosis not present

## 2014-05-23 DIAGNOSIS — M7989 Other specified soft tissue disorders: Secondary | ICD-10-CM | POA: Diagnosis not present

## 2014-05-23 DIAGNOSIS — M79601 Pain in right arm: Secondary | ICD-10-CM | POA: Diagnosis present

## 2014-05-23 DIAGNOSIS — R609 Edema, unspecified: Secondary | ICD-10-CM

## 2014-05-23 DIAGNOSIS — M79609 Pain in unspecified limb: Secondary | ICD-10-CM

## 2014-05-23 NOTE — Progress Notes (Addendum)
*  PRELIMINARY RESULTS* Vascular Ultrasound Right lower extremity venous duplex has been completed.  Preliminary findings: no evidence of DVT or baker's cyst.  Called results to [facility] at Dr. Trevor Mace office.  Landry Mellow, RDMS, RVT  05/23/2014, 2:44 PM

## 2014-12-19 ENCOUNTER — Encounter: Payer: Self-pay | Admitting: Internal Medicine

## 2014-12-31 ENCOUNTER — Other Ambulatory Visit: Payer: Self-pay

## 2015-01-22 ENCOUNTER — Other Ambulatory Visit: Payer: Self-pay

## 2015-01-22 ENCOUNTER — Ambulatory Visit (INDEPENDENT_AMBULATORY_CARE_PROVIDER_SITE_OTHER): Payer: Medicare Other | Admitting: Internal Medicine

## 2015-01-22 ENCOUNTER — Encounter: Payer: Self-pay | Admitting: Internal Medicine

## 2015-01-22 VITALS — BP 137/78 | HR 71 | Temp 96.8°F | Ht 69.0 in | Wt 248.8 lb

## 2015-01-22 DIAGNOSIS — K2270 Barrett's esophagus without dysplasia: Secondary | ICD-10-CM | POA: Diagnosis not present

## 2015-01-22 DIAGNOSIS — R1314 Dysphagia, pharyngoesophageal phase: Secondary | ICD-10-CM

## 2015-01-22 DIAGNOSIS — K219 Gastro-esophageal reflux disease without esophagitis: Secondary | ICD-10-CM

## 2015-01-22 NOTE — Patient Instructions (Signed)
Schedule EGD with esophageal dilation and biopsy (dysphagia and history of Barrett's esophagus)  Further recommendations to follow

## 2015-01-22 NOTE — Progress Notes (Signed)
Primary Care Physician:  Alonza Bogus, MD Primary Gastroenterologist:  Dr. Gala Romney  Pre-Procedure History & Physical: HPI:  [name] is a 74 y.o. male here for follow-up of Barrett's esophagus and GERD. States he ran out of prescription omeprazole. Has been taking OTC 20 mg twice daily to control symptoms although he occasionally does have breakthrough. Patient notes vague recurrent intermittent esophageal dysphagia. History of peptic stricture dilated previously. History short segment Barrett's esophagus. He is due for surveillance examination now anyway. Since he was last seen here, he had 2 knee replacements and is doing well.  Past Medical History  Diagnosis Date  . Hypertension   . Hyperlipidemia   . Gout   . Hypothyroidism     Following partial thyroidectomy  . Hx of adenomatous colonic polyps   . Barrett's esophagus   . Bradycardia 2009    Sinus rhythm in the 40s without symptoms;  . 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 Arthritis     "fingers; knees; toes" (01/05/2014)  . Squamous carcinoma     skin cancer benign arms and right ear and rightside lip  . History of skin cancer   . GERD (gastroesophageal reflux disease)     occasional - no meds  . BPH (benign prostatic hyperplasia)     Past Surgical History  Procedure Laterality Date  . Thyroidectomy, partial Right 1969    lobe-nodule present  . Abdominal hernia repair  1990's  . Esophagogastroduodenoscopy  01/2006    geographic erosive RE, noncritical peptic stricture s/p dilation, antral erosion  . Colonoscopy  12/2005    normal  . Colonoscopy  2004    tubulovillous adenoma of rectum   . Esophagogastroduodenoscopy  01/12/2011    Dr. Gala Romney- hiatal hernia, distal esophageal erosions, barretts esophagus  . Maloney dilation  01/12/2011    Procedure: [name] DILATION;  Surgeon: Daneil Dolin, MD;  [facility];  Service: Endoscopy;  Laterality: N/A;  56 french  .  Colonoscopy  01/12/2011    Dr. Gala Romney- normal rectum  . Esophagogastroduodenoscopy  01/27/2012    Dr.Chelsey Kimberley- short segment barretts esophagus, erosive reflux esophagitis. bx= barretts esophagus, negative for dysplasia  . Cataract extraction w/ intraocular lens  implant, bilateral Bilateral 2012-2014  . Hernia repair    . Umbilical hernia repair  2000  . Elbow arthroscopy Left 2014  . Squamous cell carcinoma excision      "cut off my hands; left arm; under right lip; right ear" (01/05/2014)  . Total knee arthroplasty Left 01/05/2014    Procedure: LEFT TOTAL KNEE ARTHROPLASTY;  Surgeon: Mcarthur Rossetti, MD;  Location: [place];  Service: Orthopedics;  Laterality: Left;  . Total knee arthroplasty Right 05/04/2014    Procedure: RIGHT TOTAL KNEE ARTHROPLASTY;  Surgeon: Mcarthur Rossetti, MD;  Location: [facility];  Service: Orthopedics;  Laterality: Right;    Prior to Admission medications   Medication Sig Start Date End Date Taking? Authorizing Provider  amLODipine (NORVASC) 10 MG tablet Take 10 mg by mouth every morning.    Yes Historical Provider, MD  levothyroxine (SYNTHROID, LEVOTHROID) 150 MCG tablet Take 150 mcg by mouth daily before breakfast.   Yes Historical Provider, MD  lisinopril-hydrochlorothiazide (PRINZIDE,ZESTORETIC) 20-12.5 MG per tablet Take 1 tablet by mouth 2 (two) times daily.    Yes Historical Provider, MD  omeprazole (PRILOSEC) 20 MG capsule Take 20 mg by mouth daily.   Yes Historical Provider, MD  Allergies as of 01/22/2015  . (No Known Allergies)    Family History  Problem Relation Age of Onset  . Colon cancer Neg Hx   . Liver disease Neg Hx   . Lung cancer Father 59  . Colon polyps Brother     History   Social History  . Marital Status: Married    Spouse Name: N/A  . Number of Children: 2  . Years of Education: N/A   Occupational History  . [employer]     Current employment  . [place] [employer]     Prior employment   Social History Main  Topics  . Smoking status: Former Smoker -- 1.00 packs/day for 20 years    Types: Cigarettes  . Smokeless tobacco: Never Used     Comment: "quit smoking in ~ 1982"  . Alcohol Use: 0.6 oz/week    1 Cans of beer per week     Comment: rare  . Drug Use: No  . Sexual Activity: Not on file   Other Topics Concern  . Not on file   Social History Narrative    Review of Systems: See HPI, otherwise negative ROS  Physical Exam: BP 137/78 mmHg  Pulse 71  Temp(Src) 96.8 F (36 C) (Oral)  Ht 5\' 9"  (1.753 m)  Wt 248 lb 12.8 oz (112.855 kg)  BMI 36.72 kg/m2 General:   Alert,  Well-developed, well-nourished, pleasant and cooperative in NAD Skin:  Intact without significant lesions or rashes. Eyes:  Sclera clear, no icterus.   Conjunctiva pink. Ears:  Normal auditory acuity. Nose:  No deformity, discharge,  or lesions. Mouth:  No deformity or lesions. Neck:  Supple; no masses or thyromegaly. No significant cervical adenopathy. Lungs:  Clear throughout to auscultation.   No wheezes, crackles, or rhonchi. No acute distress. Heart:  Regular rate and rhythm; no murmurs, clicks, rubs,  or gallops. Abdomen: Non-distended, normal bowel sounds.  Soft and nontender without appreciable mass or hepatosplenomegaly.  Pulses:  Normal pulses noted. Extremities:  Without clubbing or edema.  Impression:  74 year old gentleman with Barrett's esophagus/GERD and history of peptic stricture requiring dilation previously. He now has some vague recurrent esophageal dysphagia and is due for surveillance biopsies at this time.  Reflux symptoms fairly well controlled on omeprazole 20 mg twice daily.  Recommendations:    I have offered the patient an EGD with esophageal dilation and biopsy (dysphagia and history of Barrett's esophagus).  The risks, benefits, limitations, alternatives and imponderables have been reviewed with the patient. Potential for esophageal dilation, biopsy, etc. have also been reviewed.  Questions  have been answered. All parties agreeable. Further recommendations to follow      Notice: This dictation was prepared with Dragon dictation along with smaller phrase technology. Any transcriptional errors that result from this process are unintentional and may not be corrected upon review.

## 2015-02-13 ENCOUNTER — Encounter (HOSPITAL_COMMUNITY)
Admission: RE | Disposition: A | Discharge status: Home / Self Care | Payer: Self-pay | Source: Ambulatory Visit | Attending: Internal Medicine

## 2015-02-13 ENCOUNTER — Encounter (HOSPITAL_COMMUNITY): Payer: Self-pay | Admitting: *Deleted

## 2015-02-13 ENCOUNTER — Ambulatory Visit (HOSPITAL_COMMUNITY)
Admission: RE | Admit: 2015-02-13 | Discharge: 2015-02-13 | Disposition: A | Discharge status: Home / Self Care | Payer: Medicare Other | Source: Ambulatory Visit | Attending: Internal Medicine | Admitting: Internal Medicine

## 2015-02-13 DIAGNOSIS — K449 Diaphragmatic hernia without obstruction or gangrene: Secondary | ICD-10-CM | POA: Insufficient documentation

## 2015-02-13 DIAGNOSIS — K2100 Gastro-esophageal reflux disease with esophagitis, without bleeding: Secondary | ICD-10-CM | POA: Insufficient documentation

## 2015-02-13 DIAGNOSIS — Z87891 Personal history of nicotine dependence: Secondary | ICD-10-CM | POA: Insufficient documentation

## 2015-02-13 DIAGNOSIS — N400 Enlarged prostate without lower urinary tract symptoms: Secondary | ICD-10-CM | POA: Insufficient documentation

## 2015-02-13 DIAGNOSIS — R001 Bradycardia, unspecified: Secondary | ICD-10-CM | POA: Diagnosis not present

## 2015-02-13 DIAGNOSIS — R1314 Dysphagia, pharyngoesophageal phase: Secondary | ICD-10-CM

## 2015-02-13 DIAGNOSIS — Z96653 Presence of artificial knee joint, bilateral: Secondary | ICD-10-CM | POA: Diagnosis not present

## 2015-02-13 DIAGNOSIS — E890 Postprocedural hypothyroidism: Secondary | ICD-10-CM | POA: Diagnosis not present

## 2015-02-13 DIAGNOSIS — M1389 Other specified arthritis, multiple sites: Secondary | ICD-10-CM | POA: Insufficient documentation

## 2015-02-13 DIAGNOSIS — E785 Hyperlipidemia, unspecified: Secondary | ICD-10-CM | POA: Insufficient documentation

## 2015-02-13 DIAGNOSIS — I10 Essential (primary) hypertension: Secondary | ICD-10-CM | POA: Insufficient documentation

## 2015-02-13 DIAGNOSIS — K222 Esophageal obstruction: Secondary | ICD-10-CM | POA: Diagnosis not present

## 2015-02-13 DIAGNOSIS — R1310 Dysphagia, unspecified: Secondary | ICD-10-CM | POA: Diagnosis present

## 2015-02-13 DIAGNOSIS — M109 Gout, unspecified: Secondary | ICD-10-CM | POA: Insufficient documentation

## 2015-02-13 DIAGNOSIS — K2270 Barrett's esophagus without dysplasia: Secondary | ICD-10-CM

## 2015-02-13 DIAGNOSIS — Z8371 Family history of colonic polyps: Secondary | ICD-10-CM | POA: Insufficient documentation

## 2015-02-13 DIAGNOSIS — Z85828 Personal history of other malignant neoplasm of skin: Secondary | ICD-10-CM | POA: Diagnosis not present

## 2015-02-13 DIAGNOSIS — K259 Gastric ulcer, unspecified as acute or chronic, without hemorrhage or perforation: Secondary | ICD-10-CM | POA: Insufficient documentation

## 2015-02-13 HISTORY — PX: ESOPHAGEAL DILATION: SHX303

## 2015-02-13 HISTORY — PX: ESOPHAGOGASTRODUODENOSCOPY: SHX5428

## 2015-02-13 SURGERY — EGD (ESOPHAGOGASTRODUODENOSCOPY)
Anesthesia: Moderate Sedation

## 2015-02-13 MED ORDER — MIDAZOLAM HCL 5 MG/5ML IJ SOLN
INTRAMUSCULAR | Status: DC | PRN
  Administered 2015-02-13: 1 mg via INTRAVENOUS
  Administered 2015-02-13: 2 mg via INTRAVENOUS

## 2015-02-13 MED ORDER — STERILE WATER FOR IRRIGATION IR SOLN
Status: DC | PRN
  Administered 2015-02-13: 14:00:00

## 2015-02-13 MED ORDER — MIDAZOLAM HCL 5 MG/5ML IJ SOLN
INTRAMUSCULAR | Status: DC
Start: 2015-02-13 — End: 2015-02-13
  Filled 2015-02-13: qty 10

## 2015-02-13 MED ORDER — SODIUM CHLORIDE 0.9 % IV SOLN
INTRAVENOUS | Status: DC
  Administered 2015-02-13: 1000 mL via INTRAVENOUS

## 2015-02-13 MED ORDER — ONDANSETRON HCL 4 MG/2ML IJ SOLN
INTRAMUSCULAR | Status: DC
Start: 2015-02-13 — End: 2015-02-13
  Filled 2015-02-13: qty 2

## 2015-02-13 MED ORDER — MEPERIDINE HCL 100 MG/ML IJ SOLN
INTRAMUSCULAR | Status: DC | PRN
  Administered 2015-02-13: 50 mg via INTRAVENOUS
  Administered 2015-02-13: 25 mg via INTRAVENOUS

## 2015-02-13 MED ORDER — ONDANSETRON HCL 4 MG/2ML IJ SOLN
INTRAMUSCULAR | Status: DC | PRN
  Administered 2015-02-13: 4 mg via INTRAVENOUS

## 2015-02-13 MED ORDER — LIDOCAINE VISCOUS 2 % MT SOLN
OROMUCOSAL | Status: AC
  Filled 2015-02-13: qty 15

## 2015-02-13 MED ORDER — LIDOCAINE VISCOUS 2 % MT SOLN
OROMUCOSAL | Status: DC | PRN
  Administered 2015-02-13: 1 via OROMUCOSAL

## 2015-02-13 MED ORDER — MEPERIDINE HCL 100 MG/ML IJ SOLN
INTRAMUSCULAR | Status: DC
Start: 2015-02-13 — End: 2015-02-13
  Filled 2015-02-13: qty 2

## 2015-02-13 NOTE — H&P (View-Only) (Signed)
Primary Care Physician:  Alonza Bogus, MD Primary Gastroenterologist:  Dr. Gala Romney  Pre-Procedure History & Physical: HPI:  [name] is a 74 y.o. male here for follow-up of Barrett's esophagus and GERD. States he ran out of prescription omeprazole. Has been taking OTC 20 mg twice daily to control symptoms although he occasionally does have breakthrough. Patient notes vague recurrent intermittent esophageal dysphagia. History of peptic stricture dilated previously. History short segment Barrett's esophagus. He is due for surveillance examination now anyway. Since he was last seen here, he had 2 knee replacements and is doing well.  Past Medical History  Diagnosis Date  . Hypertension   . Hyperlipidemia   . Gout   . Hypothyroidism     Following partial thyroidectomy  . Hx of adenomatous colonic polyps   . Barrett's esophagus   . Bradycardia 2009    Sinus rhythm in the 40s without symptoms;  . 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 Arthritis     "fingers; knees; toes" (01/05/2014)  . Squamous carcinoma     skin cancer benign arms and right ear and rightside lip  . History of skin cancer   . GERD (gastroesophageal reflux disease)     occasional - no meds  . BPH (benign prostatic hyperplasia)     Past Surgical History  Procedure Laterality Date  . Thyroidectomy, partial Right 1969    lobe-nodule present  . Abdominal hernia repair  1990's  . Esophagogastroduodenoscopy  01/2006    geographic erosive RE, noncritical peptic stricture s/p dilation, antral erosion  . Colonoscopy  12/2005    normal  . Colonoscopy  2004    tubulovillous adenoma of rectum   . Esophagogastroduodenoscopy  01/12/2011    Dr. Gala Romney- hiatal hernia, distal esophageal erosions, barretts esophagus  . Maloney dilation  01/12/2011    Procedure: [name] DILATION;  Surgeon: Daneil Dolin, MD;  [facility];  Service: Endoscopy;  Laterality: N/A;  56 french  .  Colonoscopy  01/12/2011    Dr. Gala Romney- normal rectum  . Esophagogastroduodenoscopy  01/27/2012    Dr.Ayan Yankey- short segment barretts esophagus, erosive reflux esophagitis. bx= barretts esophagus, negative for dysplasia  . Cataract extraction w/ intraocular lens  implant, bilateral Bilateral 2012-2014  . Hernia repair    . Umbilical hernia repair  2000  . Elbow arthroscopy Left 2014  . Squamous cell carcinoma excision      "cut off my hands; left arm; under right lip; right ear" (01/05/2014)  . Total knee arthroplasty Left 01/05/2014    Procedure: LEFT TOTAL KNEE ARTHROPLASTY;  Surgeon: Mcarthur Rossetti, MD;  Location: [place];  Service: Orthopedics;  Laterality: Left;  . Total knee arthroplasty Right 05/04/2014    Procedure: RIGHT TOTAL KNEE ARTHROPLASTY;  Surgeon: Mcarthur Rossetti, MD;  Location: [facility];  Service: Orthopedics;  Laterality: Right;    Prior to Admission medications   Medication Sig Start Date End Date Taking? Authorizing Provider  amLODipine (NORVASC) 10 MG tablet Take 10 mg by mouth every morning.    Yes Historical Provider, MD  levothyroxine (SYNTHROID, LEVOTHROID) 150 MCG tablet Take 150 mcg by mouth daily before breakfast.   Yes Historical Provider, MD  lisinopril-hydrochlorothiazide (PRINZIDE,ZESTORETIC) 20-12.5 MG per tablet Take 1 tablet by mouth 2 (two) times daily.    Yes Historical Provider, MD  omeprazole (PRILOSEC) 20 MG capsule Take 20 mg by mouth daily.   Yes Historical Provider, MD  Allergies as of 01/22/2015  . (No Known Allergies)    Family History  Problem Relation Age of Onset  . Colon cancer Neg Hx   . Liver disease Neg Hx   . Lung cancer Father 37  . Colon polyps Brother     History   Social History  . Marital Status: Married    Spouse Name: N/A  . Number of Children: 2  . Years of Education: N/A   Occupational History  . [employer]     Current employment  . [employer]     Prior employment   Social History Main  Topics  . Smoking status: Former Smoker -- 1.00 packs/day for 20 years    Types: Cigarettes  . Smokeless tobacco: Never Used     Comment: "quit smoking in ~ 1982"  . Alcohol Use: 0.6 oz/week    1 Cans of beer per week     Comment: rare  . Drug Use: No  . Sexual Activity: Not on file   Other Topics Concern  . Not on file   Social History Narrative    Review of Systems: See HPI, otherwise negative ROS  Physical Exam: BP 137/78 mmHg  Pulse 71  Temp(Src) 96.8 F (36 C) (Oral)  Ht 5\' 9"  (1.753 m)  Wt 248 lb 12.8 oz (112.855 kg)  BMI 36.72 kg/m2 General:   Alert,  Well-developed, well-nourished, pleasant and cooperative in NAD Skin:  Intact without significant lesions or rashes. Eyes:  Sclera clear, no icterus.   Conjunctiva pink. Ears:  Normal auditory acuity. Nose:  No deformity, discharge,  or lesions. Mouth:  No deformity or lesions. Neck:  Supple; no masses or thyromegaly. No significant cervical adenopathy. Lungs:  Clear throughout to auscultation.   No wheezes, crackles, or rhonchi. No acute distress. Heart:  Regular rate and rhythm; no murmurs, clicks, rubs,  or gallops. Abdomen: Non-distended, normal bowel sounds.  Soft and nontender without appreciable mass or hepatosplenomegaly.  Pulses:  Normal pulses noted. Extremities:  Without clubbing or edema.  Impression:  74 year old gentleman with Barrett's esophagus/GERD and history of peptic stricture requiring dilation previously. He now has some vague recurrent esophageal dysphagia and is due for surveillance biopsies at this time.  Reflux symptoms fairly well controlled on omeprazole 20 mg twice daily.  Recommendations:    I have offered the patient an EGD with esophageal dilation and biopsy (dysphagia and history of Barrett's esophagus).  The risks, benefits, limitations, alternatives and imponderables have been reviewed with the patient. Potential for esophageal dilation, biopsy, etc. have also been reviewed.  Questions  have been answered. All parties agreeable. Further recommendations to follow      Notice: This dictation was prepared with Dragon dictation along with smaller phrase technology. Any transcriptional errors that result from this process are unintentional and may not be corrected upon review.

## 2015-02-13 NOTE — Op Note (Signed)
[facility] [address] [place], [address]   ENDOSCOPY PROCEDURE REPORT  PATIENT: Benjamin, Foley  MR#: [id number] BIRTHDATE: 23-Jun-1941 , 73  yrs. old GENDER: male ENDOSCOPIST: R.  Garfield Cornea, MD FACP [facility] REFERRED BY: PROCEDURE DATE:  Mar 11, 2015 PROCEDURE:  EGD w/ biopsy and Maloney dilation of esophagus INDICATIONS: MEDICATIONS: Versed 3 mg IV and Demerol 75 mg IV in divided doses. Xylocaine gel orally.  Zofran 4 mg IV. ASA CLASS:      Class II  CONSENT: The risks, benefits, limitations, alternatives and imponderables have been discussed.  The potential for biopsy, esophogeal dilation, etc. have also been reviewed.  Questions have been answered.  All parties agreeable.  Please see the history and physical in the medical record for more information.  DESCRIPTION OF PROCEDURE: After the risks benefits and alternatives of the procedure were thoroughly explained, informed consent was obtained.  The EG-[id number] ([id number]) endoscope was introduced through the mouth and advanced to the second portion of the duodenum , limited by Without limitations. The instrument was slowly withdrawn as the mucosa was fully examined. Estimated blood loss is zero unless otherwise noted in this procedure report.    Distal esophageal erosions, with the longest a good 5 cm coming up from the GE junction.  Also, patient had a 6 mm ulcer just above the GE junction.  Salmon colored epithelium coming up within 2 cm of the GE junction with a small island of salmon-colored epithelium just proximal to the "tongue".  No mass or nodularity.  Some very subtle stricturing of the GE junction present.  Stomach empty.  4 cm hiatal hernia.  Normal gastric mucosa.  Patent pylorus. Normal-appearing first and second portion of the duodenum.  The scope was withdrawn and a 54 [place] Maloney dilator was passed for insertion.  A lytic back revealed no apparent complication related to this maneuver.   Subsequently, biopsies of abnormal distal esophagus taken for histologic study.  EBL 5 mL.  Retroflexed views revealed as previously described. The scope was then withdrawn from the patient and the procedure completed.  COMPLICATIONS: There were no immediate complications.  ENDOSCOPIC IMPRESSION: Ulcerative/erosive reflux esophagitis. Abnormal esophagus consistent with prior diagnosis of Barrett's esophagus. Status post dilation followed by biopsy. Hiatal hernia.  RECOMMENDATIONS: Increase omeprazole to 40 mg twice daily for 30 days; thereafter, decrease to 40 mg once daily. Follow up on pathology.  Office visit with [place] in one year.  REPEAT EXAM:  eSigned:  R. Garfield Cornea, MD [facility] Mar 11, 2015 2:53 PM    CC:  CPT CODES: ICD CODES:  The ICD and CPT codes recommended by this software are interpretations from the data that the clinical staff has captured with the software.  The verification of the translation of this report to the ICD and CPT codes and modifiers is the sole responsibility of the health care institution and practicing physician where this report was generated.  [employer] will not be held responsible for the validity of the ICD and CPT codes included on this report.  AMA assumes no liability for data contained or not contained herein. CPT is a Designer, television/film set of the [employer].  PATIENT NAME:  Benjamin, Foley MR#: [id number]

## 2015-02-13 NOTE — Interval H&P Note (Signed)
History and Physical Interval Note:  02/13/2015 2:23 PM  [name]  has presented today for surgery, with the diagnosis of dysphagia/hx of Barrett's esophagus  The various methods of treatment have been discussed with the patient and family. After consideration of risks, benefits and other options for treatment, the patient has consented to  Procedure(s) with comments: ESOPHAGOGASTRODUODENOSCOPY (EGD) (N/A) - 245 ESOPHAGEAL DILATION (N/A) as a surgical intervention .  The patient's history has been reviewed, patient examined, no change in status, stable for surgery.  I have reviewed the patient's chart and labs.  Questions were answered to the patient's satisfaction.     Donel Osowski  No change. EGD with potential esophageal dilation, etc. per plan.  The risks, benefits, limitations, alternatives and imponderables have been reviewed with the patient. Potential for esophageal dilation, biopsy, etc. have also been reviewed.  Questions have been answered. All parties agreeable.

## 2015-02-13 NOTE — Discharge Instructions (Signed)
EGD Discharge instructions Please read the instructions outlined below and refer to this sheet in the next few weeks. These discharge instructions provide you with general information on caring for yourself after you leave the hospital. Your doctor may also give you specific instructions. While your treatment has been planned according to the most current medical practices available, unavoidable complications occasionally occur. If you have any problems or questions after discharge, please call your doctor. ACTIVITY  You may resume your regular activity but move at a slower pace for the next 24 hours.   Take frequent rest periods for the next 24 hours.   Walking will help expel (get rid of) the air and reduce the bloated feeling in your abdomen.   No driving for 24 hours (because of the anesthesia (medicine) used during the test).   You may shower.   Do not sign any important legal documents or operate any machinery for 24 hours (because of the anesthesia used during the test).  NUTRITION  Drink plenty of fluids.   You may resume your normal diet.   Begin with a light meal and progress to your normal diet.   Avoid alcoholic beverages for 24 hours or as instructed by your caregiver.  MEDICATIONS  You may resume your normal medications unless your caregiver tells you otherwise.  WHAT YOU CAN EXPECT TODAY  You may experience abdominal discomfort such as a feeling of fullness or gas pains.  FOLLOW-UP  Your doctor will discuss the results of your test with you.  SEEK IMMEDIATE MEDICAL ATTENTION IF ANY OF THE FOLLOWING OCCUR:  Excessive nausea (feeling sick to your stomach) and/or vomiting.   Severe abdominal pain and distention (swelling).   Trouble swallowing.   Temperature over 101 F (37.8 C).   Rectal bleeding or vomiting of blood.   GERD and Barretts esophagus information provided Follow up with office visit in one year  Omeprazole 40mg , take one twice daily by  mouth  For one month, then decrease to one daily therapeutically  Further recommendations pending pathology results    Gastroesophageal Reflux Disease, Adult Gastroesophageal reflux disease (GERD) happens when acid from your stomach flows up into the esophagus. When acid comes in contact with the esophagus, the acid causes soreness (inflammation) in the esophagus. Over time, GERD may create small holes (ulcers) in the lining of the esophagus. CAUSES   Increased body weight. This puts pressure on the stomach, making acid rise from the stomach into the esophagus.  Smoking. This increases acid production in the stomach.  Drinking alcohol. This causes decreased pressure in the lower esophageal sphincter (valve or ring of muscle between the esophagus and stomach), allowing acid from the stomach into the esophagus.  Late evening meals and a full stomach. This increases pressure and acid production in the stomach.  A malformed lower esophageal sphincter. Sometimes, no cause is found. SYMPTOMS   Burning pain in the lower part of the mid-chest behind the breastbone and in the mid-stomach area. This may occur twice a week or more often.  Trouble swallowing.  Sore throat.  Dry cough.  Asthma-like symptoms including chest tightness, shortness of breath, or wheezing. DIAGNOSIS  Your caregiver may be able to diagnose GERD based on your symptoms. In some cases, X-rays and other tests may be done to check for complications or to check the condition of your stomach and esophagus. TREATMENT  Your caregiver may recommend over-the-counter or prescription medicines to help decrease acid production. Ask your caregiver before starting or  adding any new medicines.  HOME CARE INSTRUCTIONS   Change the factors that you can control. Ask your caregiver for guidance concerning weight loss, quitting smoking, and alcohol consumption.  Avoid foods and drinks that make your symptoms worse, such as:  Caffeine  or alcoholic drinks.  Chocolate.  Peppermint or mint flavorings.  Garlic and onions.  Spicy foods.  Citrus fruits, such as oranges, lemons, or limes.  Tomato-based foods such as sauce, chili, salsa, and pizza.  Fried and fatty foods.  Avoid lying down for the 3 hours prior to your bedtime or prior to taking a nap.  Eat small, frequent meals instead of large meals.  Wear loose-fitting clothing. Do not wear anything tight around your waist that causes pressure on your stomach.  Raise the head of your bed 6 to 8 inches with wood blocks to help you sleep. Extra pillows will not help.  Only take over-the-counter or prescription medicines for pain, discomfort, or fever as directed by your caregiver.  Do not take aspirin, ibuprofen, or other nonsteroidal anti-inflammatory drugs (NSAIDs). SEEK IMMEDIATE MEDICAL CARE IF:   You have pain in your arms, neck, jaw, teeth, or back.  Your pain increases or changes in intensity or duration.  You develop nausea, vomiting, or sweating (diaphoresis).  You develop shortness of breath, or you faint.  Your vomit is green, yellow, black, or looks like coffee grounds or blood.  Your stool is red, bloody, or black. These symptoms could be signs of other problems, such as heart disease, gastric bleeding, or esophageal bleeding. MAKE SURE YOU:   Understand these instructions.  Will watch your condition.  Will get help right away if you are not doing well or get worse. Document Released: 04/01/2005 Document Revised: 09/14/2011 Document Reviewed: 01/09/2011 [facility] [place] Patient Information 2015 [place], [place]. This information is not intended to replace advice given to you by your health care provider. Make sure you discuss any questions you have with your health care provider.   Barrett's Esophagus Barrett's esophagus occurs when the lining of the esophagus is damaged. The esophagus is the tube that carries food from the mouth to the stomach.  With Barrett's esophagus, the lining of the esophagus gets replaced by material that is similar to the lining in the intestines. This process is called intestinal metaplasia. A small number of people with Barrett's esophagus develop esophageal cancer. CAUSES  The exact cause of Barrett's esophagus is unknown. SYMPTOMS  Most people with Barrett's esophagus do not have symptoms. However, many patients also have gastroesophageal reflux disease (GERD). GERD can cause heartburn, trouble swallowing, and a dry cough. DIAGNOSIS Barrett's esophagus is diagnosed by an exam called upper gastrointestinal endoscopy. A thin, flexible tube (endoscope) is passed down the esophagus. The endoscope has a light and camera on the end. Your caregiver uses the endoscope to view the inside of the esophagus. A tissue sample may also be taken and examined under a microscope (biopsy). If cancer cells are found during the biopsy, this condition is called dysplasia. TREATMENT  If you have no dysplasia or low-grade dysplasia, your caregiver may recommend no treatment or only taking medicines to treat GERD. Sometimes, taking acid-blocking drugs to treat GERD helps improve the tissue affected by Barrett's esophagus. Your caregiver may also recommend periodic esophageal exams. If you have high-grade dysplasia, treatment may include removing the damaged parts of the esophagus. This can be done by heating, freezing, or surgically removing the tissue. In some cases, surgery may be done to remove most  of the esophagus. The stomach is then attached to the remaining portion of the esophagus. HOME CARE INSTRUCTIONS  Take acid-blocking drugs for GERD if recommended by your caregiver.  Keep all follow-up appointments as directed by your caregiver. You may need periodic esophageal exams. SEEK IMMEDIATE MEDICAL CARE IF:  You have chest pain.  You have trouble swallowing.  You vomit blood or material that looks like coffee grounds.  Your  stools are bright red or dark. Document Released: 09/12/2003 Document Revised: 12/22/2011 Document Reviewed: 09/01/2011 [facility] Patient Information 2015 [place], [place]. This information is not intended to replace advice given to you by your health care provider. Make sure you discuss any questions you have with your health care provider.

## 2015-02-16 ENCOUNTER — Encounter: Payer: Self-pay | Admitting: Internal Medicine

## 2015-02-18 ENCOUNTER — Encounter (HOSPITAL_COMMUNITY): Payer: Self-pay | Admitting: Internal Medicine

## 2015-02-19 ENCOUNTER — Telehealth: Payer: Self-pay

## 2015-02-19 NOTE — Telephone Encounter (Signed)
Per RMR-  Send letter to patient.  Send copy of letter with path to referring [name] and PCP.   Needs office visit in 3 months to reassess symptoms

## 2015-02-19 NOTE — Telephone Encounter (Signed)
Letter mailed to the pt. 

## 2015-02-19 NOTE — Telephone Encounter (Signed)
ON RECALL LIST  °

## 2015-04-22 ENCOUNTER — Encounter: Payer: Self-pay | Admitting: Internal Medicine

## 2015-06-11 ENCOUNTER — Other Ambulatory Visit: Payer: Self-pay

## 2015-06-11 ENCOUNTER — Ambulatory Visit (INDEPENDENT_AMBULATORY_CARE_PROVIDER_SITE_OTHER): Payer: Medicare Other | Admitting: Internal Medicine

## 2015-06-11 ENCOUNTER — Encounter: Payer: Self-pay | Admitting: Internal Medicine

## 2015-06-11 VITALS — BP 172/89 | HR 52 | Temp 97.0°F | Ht 69.0 in | Wt 250.2 lb

## 2015-06-11 DIAGNOSIS — K222 Esophageal obstruction: Secondary | ICD-10-CM | POA: Diagnosis not present

## 2015-06-11 DIAGNOSIS — K2100 Gastro-esophageal reflux disease with esophagitis, without bleeding: Secondary | ICD-10-CM

## 2015-06-11 DIAGNOSIS — Z86010 Personal history of colonic polyps: Secondary | ICD-10-CM

## 2015-06-11 DIAGNOSIS — K2270 Barrett's esophagus without dysplasia: Secondary | ICD-10-CM | POA: Diagnosis not present

## 2015-06-11 DIAGNOSIS — K921 Melena: Secondary | ICD-10-CM

## 2015-06-11 MED ORDER — PEG 3350-KCL-NA BICARB-NACL 420 G PO SOLR: 4000.0000 mL | ORAL | Status: DC

## 2015-06-11 NOTE — Patient Instructions (Signed)
Continue omeprazole 40 mg daily  GERD and constipation information provided  Schedule a colonoscopy January 2017 - hematochezia and colon polyp (split prep)  Miralax 17 grams orally at bedtime as needed for constipation  Further recommendations to follow

## 2015-06-11 NOTE — Progress Notes (Signed)
Primary Care Physician:  Alonza Bogus, MD Primary Gastroenterologist:  Dr. Gala Romney  Pre-Procedure History & Physical: HPI:  [name] is a 74 y.o. male here for followup of GERD. History of ulcerative reflux esophagitis with stricture dilated earlier this year. Barrett's esophagus. No dysplasia. Chronically constipated and has had some nonspecific lower abdominal pain. Single episode rectal bleeding. Distant history tubulovillous adenoma. It's been 5 years since he has last colonoscopy. Omeprazole 40 mg once daily his reflux symptoms well.  Past Medical History  Diagnosis Date  . Hypertension   . Hyperlipidemia   . Gout   . Hypothyroidism     Following partial thyroidectomy  . Hx of adenomatous colonic polyps   . Barrett's esophagus   . Bradycardia 2009    Sinus rhythm in the 40s without symptoms;  . 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 Arthritis     "fingers; knees; toes" (01/05/2014)  . Squamous carcinoma (HCC)     skin cancer benign arms and right ear and rightside lip  . History of skin cancer   . GERD (gastroesophageal reflux disease)     ulcerative/erosive  . BPH (benign prostatic hyperplasia)   . Hiatal hernia     Past Surgical History  Procedure Laterality Date  . Thyroidectomy, partial Right 1969    lobe-nodule present  . Abdominal hernia repair  1990's  . Esophagogastroduodenoscopy  01/2006    geographic erosive RE, noncritical peptic stricture s/p dilation, antral erosion  . Colonoscopy  12/2005    normal  . Colonoscopy  2004    tubulovillous adenoma of rectum   . Esophagogastroduodenoscopy  01/12/2011    Dr. Gala Romney- hiatal hernia, distal esophageal erosions, barretts esophagus  . Maloney dilation  01/12/2011    Procedure: [name] DILATION;  Surgeon: Daneil Dolin, MD;  Location: [facility];  Service: Endoscopy;  Laterality: N/A;  56 french  . Colonoscopy  01/12/2011    Dr. Gala Romney- normal rectum  .  Esophagogastroduodenoscopy  01/27/2012    Dr.Newell Frater- short segment barretts esophagus, erosive reflux esophagitis. bx= barretts esophagus, negative for dysplasia  . Cataract extraction w/ intraocular lens  implant, bilateral Bilateral 2012-2014  . Hernia repair    . Umbilical hernia repair  2000  . Elbow arthroscopy Left 2014  . Squamous cell carcinoma excision      "cut off my hands; left arm; under right lip; right ear" (01/05/2014)  . 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 Total knee arthroplasty Right 05/04/2014    Procedure: RIGHT TOTAL KNEE ARTHROPLASTY;  Surgeon: Mcarthur Rossetti, MD;  Location: [facility];  Service: Orthopedics;  Laterality: Right;  . Esophagogastroduodenoscopy N/A 02/13/2015    Dr.Promyse Ardito- ulcerative/erosive reflux esophagitis, abnormal esophagitis c/w prior diagnosis of barretts esophagus, hiatal hernia. bx=barretts esophagus  . Esophageal dilation N/A 02/13/2015    Procedure: ESOPHAGEAL DILATION;  Surgeon: Daneil Dolin, MD;  Location: [facility];  Service: Endoscopy;  Laterality: N/A;    Prior to Admission medications   Medication Sig Start Date End Date Taking? Authorizing Provider  amLODipine (NORVASC) 10 MG tablet Take 10 mg by mouth every morning.    Yes Historical Provider, MD  levothyroxine (SYNTHROID, LEVOTHROID) 150 MCG tablet Take 150 mcg by mouth daily before breakfast.   Yes Historical Provider, MD  lisinopril-hydrochlorothiazide Reita May)  20-12.5 MG per tablet Take 1 tablet by mouth 2 (two) times daily.    Yes Historical Provider, MD  omeprazole (PRILOSEC) 20 MG capsule Take 20 mg by mouth daily.   Yes Historical Provider, MD  tamsulosin (FLOMAX) 0.4 MG CAPS capsule Take 1 capsule by mouth daily. 01/15/15  Yes Historical Provider, MD    Allergies as of 06/11/2015  . (No Known Allergies)     Family History  Problem Relation Age of Onset  . Colon cancer Neg Hx   . Liver disease Neg Hx   . Lung cancer Father 40  . Colon polyps Brother     Social History   Social History  . Marital Status: Married    Spouse Name: N/A  . Number of Children: 2  . Years of Education: N/A   Occupational History  . [employer]     Current employment  . [employer]     Prior employment   Social History Main Topics  . Smoking status: Former Smoker -- 1.00 packs/day for 20 years    Types: Cigarettes  . Smokeless tobacco: Never Used     Comment: "quit smoking in ~ 1982"  . Alcohol Use: 0.6 oz/week    1 Cans of beer per week     Comment: rare  . Drug Use: No  . Sexual Activity: Not on file   Other Topics Concern  . Not on file   Social History Narrative    Review of Systems: See HPI, otherwise negative ROS  Physical Exam: BP 172/89 mmHg  Pulse 52  Temp(Src) 97 F (36.1 C)  Ht 5\' 9"  (1.753 m)  Wt 250 lb 3.2 oz (113.49 kg)  BMI 36.93 kg/m2 General:  Somewhat disheveled pleasant and cooperative in NAD Skin:  Intact without significant lesions or rashes. Neck:  Supple; no masses or thyromegaly. No significant cervical adenopathy. Lungs:  Clear throughout to auscultation.   No wheezes, crackles, or rhonchi. No acute distress. Heart:  Regular rate and rhythm; no murmurs, clicks, rubs,  or gallops. Abdomen: Non-distended, normal bowel sounds.  Soft and nontender without appreciable mass or hepatosplenomegaly.  Pulses:  Normal pulses noted. Extremities:  Without clubbing or edema.  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 He's had a single episode of hematochezia since his last office visit. Chronically constipated. Hasn't been taking anything other than mineral all. No dysphagia. Reflux symptoms well controlled on omeprazole 40 mg daily.  History of Barrett's esophagus.     Recommendations:   Continue omeprazole 40 mg daily  GERD and constipation information provided  Schedule a colonoscopy January 2017 - hematochezia and colon polyp (split prep)  Miralax 17 grams orally at bedtime as needed for constipation  Further recommendations to follow

## 2015-07-09 ENCOUNTER — Ambulatory Visit (HOSPITAL_COMMUNITY)
Admission: RE | Admit: 2015-07-09 | Discharge: 2015-07-09 | Disposition: A | Discharge status: Home Health | Payer: Medicare Other | Source: Ambulatory Visit | Attending: Internal Medicine | Admitting: Internal Medicine

## 2015-07-09 ENCOUNTER — Encounter (HOSPITAL_COMMUNITY)
Admission: RE | Disposition: A | Discharge status: Home Health | Payer: Self-pay | Source: Ambulatory Visit | Attending: Internal Medicine

## 2015-07-09 ENCOUNTER — Encounter (HOSPITAL_COMMUNITY): Payer: Self-pay | Admitting: *Deleted

## 2015-07-09 DIAGNOSIS — Z87891 Personal history of nicotine dependence: Secondary | ICD-10-CM | POA: Insufficient documentation

## 2015-07-09 DIAGNOSIS — Z85828 Personal history of other malignant neoplasm of skin: Secondary | ICD-10-CM | POA: Diagnosis not present

## 2015-07-09 DIAGNOSIS — Z86010 Personal history of colonic polyps: Secondary | ICD-10-CM

## 2015-07-09 DIAGNOSIS — M19049 Primary osteoarthritis, unspecified hand: Secondary | ICD-10-CM | POA: Insufficient documentation

## 2015-07-09 DIAGNOSIS — K449 Diaphragmatic hernia without obstruction or gangrene: Secondary | ICD-10-CM | POA: Diagnosis not present

## 2015-07-09 DIAGNOSIS — Z96651 Presence of right artificial knee joint: Secondary | ICD-10-CM | POA: Diagnosis not present

## 2015-07-09 DIAGNOSIS — E785 Hyperlipidemia, unspecified: Secondary | ICD-10-CM | POA: Diagnosis not present

## 2015-07-09 DIAGNOSIS — K921 Melena: Secondary | ICD-10-CM | POA: Diagnosis not present

## 2015-07-09 DIAGNOSIS — I10 Essential (primary) hypertension: Secondary | ICD-10-CM | POA: Insufficient documentation

## 2015-07-09 DIAGNOSIS — D122 Benign neoplasm of ascending colon: Secondary | ICD-10-CM | POA: Diagnosis not present

## 2015-07-09 DIAGNOSIS — D120 Benign neoplasm of cecum: Secondary | ICD-10-CM | POA: Diagnosis not present

## 2015-07-09 DIAGNOSIS — Z8371 Family history of colonic polyps: Secondary | ICD-10-CM | POA: Diagnosis not present

## 2015-07-09 DIAGNOSIS — K219 Gastro-esophageal reflux disease without esophagitis: Secondary | ICD-10-CM | POA: Insufficient documentation

## 2015-07-09 HISTORY — PX: COLONOSCOPY: SHX5424

## 2015-07-09 SURGERY — COLONOSCOPY
Anesthesia: Moderate Sedation

## 2015-07-09 MED ORDER — MIDAZOLAM HCL 5 MG/5ML IJ SOLN
INTRAMUSCULAR | Status: DC | PRN
  Administered 2015-07-09: 2 mg via INTRAVENOUS
  Administered 2015-07-09: 1 mg via INTRAVENOUS
  Administered 2015-07-09: 2 mg via INTRAVENOUS

## 2015-07-09 MED ORDER — MEPERIDINE HCL 100 MG/ML IJ SOLN
INTRAMUSCULAR | Status: DC | PRN
Start: 2015-07-09 — End: 2015-07-09
  Administered 2015-07-09: 50 mg via INTRAVENOUS
  Administered 2015-07-09: 25 mg via INTRAVENOUS

## 2015-07-09 MED ORDER — MIDAZOLAM HCL 5 MG/5ML IJ SOLN
INTRAMUSCULAR | Status: AC
  Filled 2015-07-09: qty 10

## 2015-07-09 MED ORDER — MEPERIDINE HCL 100 MG/ML IJ SOLN
INTRAMUSCULAR | Status: AC
  Filled 2015-07-09: qty 2

## 2015-07-09 MED ORDER — SODIUM CHLORIDE 0.9 % IV SOLN
INTRAVENOUS | Status: DC
  Administered 2015-07-09: 07:00:00 via INTRAVENOUS

## 2015-07-09 MED ORDER — SIMETHICONE 40 MG/0.6ML PO SUSP
ORAL | Status: DC | PRN
  Administered 2015-07-09: 08:00:00

## 2015-07-09 MED ORDER — ONDANSETRON HCL 4 MG/2ML IJ SOLN
INTRAMUSCULAR | Status: AC
  Filled 2015-07-09: qty 2

## 2015-07-09 MED ORDER — ONDANSETRON HCL 4 MG/2ML IJ SOLN
INTRAMUSCULAR | Status: DC | PRN
  Administered 2015-07-09: 4 mg via INTRAVENOUS

## 2015-07-09 NOTE — Discharge Instructions (Signed)
°Colonoscopy °Discharge Instructions ° °Read the instructions outlined below and refer to this sheet in the next few weeks. These discharge instructions provide you with general information on caring for yourself after you leave the hospital. Your doctor may also give you specific instructions. While your treatment has been planned according to the most current medical practices available, unavoidable complications occasionally occur. If you have any problems or questions after discharge, call Dr. Rourk at [phone]. °ACTIVITY °· You may resume your regular activity, but move at a slower pace for the next 24 hours.  °· Take frequent rest periods for the next 24 hours.  °· Walking will help get rid of the air and reduce the bloated feeling in your belly (abdomen).  °· No driving for 24 hours (because of the medicine (anesthesia) used during the test).   °· Do not sign any important legal documents or operate any machinery for 24 hours (because of the anesthesia used during the test).  °NUTRITION °· Drink plenty of fluids.  °· You may resume your normal diet as instructed by your doctor.  °· Begin with a light meal and progress to your normal diet. Heavy or fried foods are harder to digest and may make you feel sick to your stomach (nauseated).  °· Avoid alcoholic beverages for 24 hours or as instructed.  °MEDICATIONS °· You may resume your normal medications unless your doctor tells you otherwise.  °WHAT YOU CAN EXPECT TODAY °· Some feelings of bloating in the abdomen.  °· Passage of more gas than usual.  °· Spotting of blood in your stool or on the toilet paper.  °IF YOU HAD POLYPS REMOVED DURING THE COLONOSCOPY: °· No aspirin products for 7 days or as instructed.  °· No alcohol for 7 days or as instructed.  °· Eat a soft diet for the next 24 hours.  °FINDING OUT THE RESULTS OF YOUR TEST °Not all test results are available during your visit. If your test results are not back during the visit, make an appointment  with your caregiver to find out the results. Do not assume everything is normal if you have not heard from your caregiver or the medical facility. It is important for you to follow up on all of your test results.  °SEEK IMMEDIATE MEDICAL ATTENTION IF: °· You have more than a spotting of blood in your stool.  °· Your belly is swollen (abdominal distention).  °· You are nauseated or vomiting.  °· You have a temperature over 101.  °· You have abdominal pain or discomfort that is severe or gets worse throughout the day.  ° ° °Polyp information provided ° °Further recommendations to follow pending review of pathology report ° °Colon Polyps °Polyps are lumps of extra tissue growing inside the body. Polyps can grow in the large intestine (colon). Most colon polyps are noncancerous (benign). However, some colon polyps can become cancerous over time. Polyps that are larger than a pea may be harmful. To be safe, caregivers remove and test all polyps. °CAUSES  °Polyps form when mutations in the genes cause your cells to grow and divide even though no more tissue is needed. °RISK FACTORS °There are a number of risk factors that can increase your chances of getting colon polyps. They include: °· Being older than 50 years. °· Family history of colon polyps or colon cancer. °· Long-term colon diseases, such as colitis or Crohn disease. °· Being overweight. °· Smoking. °· Being inactive. °· Drinking too much alcohol. °SYMPTOMS  °  Most small polyps do not cause symptoms. If symptoms are present, they may include:  Blood in the stool. The stool may look dark red or black.  Constipation or diarrhea that lasts longer than 1 week. DIAGNOSIS People often do not know they have polyps until their caregiver finds them during a regular checkup. Your caregiver can use 4 tests to check for polyps:  Digital rectal exam. The caregiver wears gloves and feels inside the rectum. This test would find polyps only in the rectum.  Barium  enema. The caregiver puts a liquid called barium into your rectum before taking X-rays of your colon. Barium makes your colon look white. Polyps are dark, so they are easy to see in the X-ray pictures.  Sigmoidoscopy. A thin, flexible tube (sigmoidoscope) is placed into your rectum. The sigmoidoscope has a light and tiny camera in it. The caregiver uses the sigmoidoscope to look at the last third of your colon.  Colonoscopy. This test is like sigmoidoscopy, but the caregiver looks at the entire colon. This is the most common method for finding and removing polyps. TREATMENT  Any polyps will be removed during a sigmoidoscopy or colonoscopy. The polyps are then tested for cancer. PREVENTION  To help lower your risk of getting more colon polyps:  Eat plenty of fruits and vegetables. Avoid eating fatty foods.  Do not smoke.  Avoid drinking alcohol.  Exercise every day.  Lose weight if recommended by your caregiver.  Eat plenty of calcium and folate. Foods that are rich in calcium include milk, cheese, and broccoli. Foods that are rich in folate include chickpeas, kidney beans, and spinach. HOME CARE INSTRUCTIONS Keep all follow-up appointments as directed by your caregiver. You may need periodic exams to check for polyps. SEEK MEDICAL CARE IF: You notice bleeding during a bowel movement.   This information is not intended to replace advice given to you by your health care provider. Make sure you discuss any questions you have with your health care provider.

## 2015-07-09 NOTE — H&P (View-Only) (Signed)
Primary Care Physician:  Alonza Bogus, MD Primary Gastroenterologist:  Dr. Gala Romney  Pre-Procedure History & Physical: HPI:  [name] is a 75 y.o. male here for followup of GERD. History of ulcerative reflux esophagitis with stricture dilated earlier this year. Barrett's esophagus. No dysplasia. Chronically constipated and has had some nonspecific lower abdominal pain. Single episode rectal bleeding. Distant history tubulovillous adenoma. It's been 5 years since he has last colonoscopy. Omeprazole 40 mg once daily his reflux symptoms well.  Past Medical History  Diagnosis Date  . Hypertension   . Hyperlipidemia   . Gout   . Hypothyroidism     Following partial thyroidectomy  . Hx of adenomatous colonic polyps   . Barrett's esophagus   . Bradycardia 2009    Sinus rhythm in the 40s without symptoms;  . 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 Arthritis     "fingers; knees; toes" (01/05/2014)  . Squamous carcinoma (HCC)     skin cancer benign arms and right ear and rightside lip  . History of skin cancer   . GERD (gastroesophageal reflux disease)     ulcerative/erosive  . BPH (benign prostatic hyperplasia)   . Hiatal hernia     Past Surgical History  Procedure Laterality Date  . Thyroidectomy, partial Right 1969    lobe-nodule present  . Abdominal hernia repair  1990's  . Esophagogastroduodenoscopy  01/2006    geographic erosive RE, noncritical peptic stricture s/p dilation, antral erosion  . Colonoscopy  12/2005    normal  . Colonoscopy  2004    tubulovillous adenoma of rectum   . Esophagogastroduodenoscopy  01/12/2011    Dr. Gala Romney- hiatal hernia, distal esophageal erosions, barretts esophagus  . Maloney dilation  01/12/2011    Procedure: [name] DILATION;  Surgeon: Daneil Dolin, MD;  Location: [facility];  Service: Endoscopy;  Laterality: N/A;  56 french  . Colonoscopy  01/12/2011    Dr. Gala Romney- normal rectum  .  Esophagogastroduodenoscopy  01/27/2012    Dr.Rourk- short segment barretts esophagus, erosive reflux esophagitis. bx= barretts esophagus, negative for dysplasia  . Cataract extraction w/ intraocular lens  implant, bilateral Bilateral 2012-2014  . Hernia repair    . Umbilical hernia repair  2000  . Elbow arthroscopy Left 2014  . Squamous cell carcinoma excision      "cut off my hands; left arm; under right lip; right ear" (01/05/2014)  . 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 Total knee arthroplasty Right 05/04/2014    Procedure: RIGHT TOTAL KNEE ARTHROPLASTY;  Surgeon: Mcarthur Rossetti, MD;  Location: [facility];  Service: Orthopedics;  Laterality: Right;  . Esophagogastroduodenoscopy N/A 02/13/2015    Dr.Rourk- ulcerative/erosive reflux esophagitis, abnormal esophagitis c/w prior diagnosis of barretts esophagus, hiatal hernia. bx=barretts esophagus  . Esophageal dilation N/A 02/13/2015    Procedure: ESOPHAGEAL DILATION;  Surgeon: Daneil Dolin, MD;  Location: [facility];  Service: Endoscopy;  Laterality: N/A;    Prior to Admission medications   Medication Sig Start Date End Date Taking? Authorizing Provider  amLODipine (NORVASC) 10 MG tablet Take 10 mg by mouth every morning.    Yes Historical Provider, MD  levothyroxine (SYNTHROID, LEVOTHROID) 150 MCG tablet Take 150 mcg by mouth daily before breakfast.   Yes Historical Provider, MD  lisinopril-hydrochlorothiazide Reita May)  20-12.5 MG per tablet Take 1 tablet by mouth 2 (two) times daily.    Yes Historical Provider, MD  omeprazole (PRILOSEC) 20 MG capsule Take 20 mg by mouth daily.   Yes Historical Provider, MD  tamsulosin (FLOMAX) 0.4 MG CAPS capsule Take 1 capsule by mouth daily. 01/15/15  Yes Historical Provider, MD    Allergies as of 06/11/2015  . (No Known Allergies)     Family History  Problem Relation Age of Onset  . Colon cancer Neg Hx   . Liver disease Neg Hx   . Lung cancer Father 51  . Colon polyps Brother     Social History   Social History  . Marital Status: Married    Spouse Name: N/A  . Number of Children: 2  . Years of Education: N/A   Occupational History  . [employer]     Current employment  . [employer]     Prior employment   Social History Main Topics  . Smoking status: Former Smoker -- 1.00 packs/day for 20 years    Types: Cigarettes  . Smokeless tobacco: Never Used     Comment: "quit smoking in ~ 1982"  . Alcohol Use: 0.6 oz/week    1 Cans of beer per week     Comment: rare  . Drug Use: No  . Sexual Activity: Not on file   Other Topics Concern  . Not on file   Social History Narrative    Review of Systems: See HPI, otherwise negative ROS  Physical Exam: BP 172/89 mmHg  Pulse 52  Temp(Src) 97 F (36.1 C)  Ht 5\' 9"  (1.753 m)  Wt 250 lb 3.2 oz (113.49 kg)  BMI 36.93 kg/m2 General:  Somewhat disheveled pleasant and cooperative in NAD Skin:  Intact without significant lesions or rashes. Neck:  Supple; no masses or thyromegaly. No significant cervical adenopathy. Lungs:  Clear throughout to auscultation.   No wheezes, crackles, or rhonchi. No acute distress. Heart:  Regular rate and rhythm; no murmurs, clicks, rubs,  or gallops. Abdomen: Non-distended, normal bowel sounds.  Soft and nontender without appreciable mass or hepatosplenomegaly.  Pulses:  Normal pulses noted. Extremities:  Without clubbing or edema.  Impression:  Pleasant 75 year old gentleman with history tubulovillous adenoma in the distant past  - last colonoscopy 5 years ago. He's had a single episode of hematochezia since his last office visit. Chronically constipated. Hasn't been taking anything other than mineral all. No dysphagia. Reflux symptoms well controlled on omeprazole 40 mg daily.  History of Barrett's esophagus.     Recommendations:   Continue omeprazole 40 mg daily  GERD and constipation information provided  Schedule a colonoscopy January 2017 - hematochezia and colon polyp (split prep)  Miralax 17 grams orally at bedtime as needed for constipation  Further recommendations to follow

## 2015-07-09 NOTE — Op Note (Signed)
[facility] [address] [place], [address]   COLONOSCOPY PROCEDURE REPORT  PATIENT: Benjamin, Foley  MR#: [id number] BIRTHDATE: 10-Nov-1940 , 74  yrs. old GENDER: male ENDOSCOPIST: R.  Garfield Cornea, MD FACP FACG REFERRED [id number] Addis, D.O PROCEDURE DATE:  07-17-2015 PROCEDURE:   Colonoscopy with ablation and snare polypectomy INDICATIONS:apparent self-limiting hematochezia; history of colonic adenoma. MEDICATIONS: Versed 5 mg IV and Demerol 75 mg IV in divided doses. Zofran 4 mg IV. ASA CLASS:       Class II  CONSENT: The risks, benefits, alternatives and imponderables including but not limited to bleeding, perforation as well as the possibility of a missed lesion have been reviewed.  The potential for biopsy, lesion removal, etc. have also been discussed. Questions have been answered.  All parties agreeable.  Please see the history and physical in the medical record for more information.  DESCRIPTION OF PROCEDURE:   After the risks benefits and alternatives of the procedure were thoroughly explained, informed consent was obtained.  The digital rectal exam revealed no abnormalities of the rectum.   The [id number]  endoscope was introduced through the anus and advanced to the cecum, which was identified by both the appendix and ileocecal valve. No adverse events experienced.   The quality of the prep was adequate  The instrument was then slowly withdrawn as the colon was fully examined. Estimated blood loss is zero unless otherwise noted in this procedure report.      COLON FINDINGS: Normal-appearing rectal mucosa.  Patient had multiple right-sided colon polyps?"(3) in the mid ascending segment?"with the largest being 8 mm in dimensions.  They were pedunculated.  There was one 3 mm polyp in the base of the cecum. Otherwise, the remainder of the colonic mucosa appeared normal aside from innocent AVM in the ascending segment..  Multiple  hot and cold snare polypectomies were performed.  I attempted to remove the cecal polyp with cold snare technique - only got part of the polyp.  It was at a tangential angle adjacent to the appendiceal orifice.  Ultimately, I ablated this lesion with the tip of the hot snare loop.  Retroflexion was performed. .  Withdrawal time=14 minutes 0 seconds.  The scope was withdrawn and the procedure completed. COMPLICATIONS: There were no immediate complications. EBL 3 mL ENDOSCOPIC IMPRESSION: Multiple colonic polyps?" removed/ablated as described above. Colonic AVM.  RECOMMENDATIONS: Follow up on pathology.  eSigned:  R. Garfield Cornea, MD Rosalita Chessman [facility] 07/17/15 8:22 AM   cc:  CPT CODES: ICD CODES:  The ICD and CPT codes recommended by this software are interpretations from the data that the clinical staff has captured with the software.  The verification of the translation of this report to the ICD and CPT codes and modifiers is the sole responsibility of the health care institution and practicing physician where this report was generated.  [facility]. will not be held responsible for the validity of the ICD and CPT codes included on this report.  AMA assumes no liability for data contained or not contained herein. CPT is a Designer, television/film set of the [employer].

## 2015-07-09 NOTE — Interval H&P Note (Signed)
History and Physical Interval Note:  07/09/2015 7:34 AM  [name]  has presented today for surgery, with the diagnosis of hematochezia/colon polyp  The various methods of treatment have been discussed with the patient and family. After consideration of risks, benefits and other options for treatment, the patient has consented to  Procedure(s) with comments: COLONOSCOPY (N/A) - 730  as a surgical intervention .  The patient's history has been reviewed, patient examined, no change in status, stable for surgery.  I have reviewed the patient's chart and labs.  Questions were answered to the patient's satisfaction.     [name]  No change. Surveillance/diagnostic colonoscopy per plan.  The risks, benefits, limitations, alternatives and imponderables have been reviewed with the patient. Questions have been answered. All parties are agreeable.

## 2015-07-10 ENCOUNTER — Encounter (HOSPITAL_COMMUNITY): Payer: Self-pay

## 2015-07-10 ENCOUNTER — Encounter: Payer: Self-pay | Admitting: Internal Medicine

## 2015-07-10 ENCOUNTER — Telehealth: Payer: Self-pay

## 2015-07-10 ENCOUNTER — Encounter (HOSPITAL_COMMUNITY)
Admission: RE | Admit: 2015-07-10 | Discharge: 2015-07-10 | Disposition: A | Discharge status: Home / Self Care | Payer: Medicare Other | Source: Ambulatory Visit | Attending: General Surgery | Admitting: General Surgery

## 2015-07-10 DIAGNOSIS — I10 Essential (primary) hypertension: Secondary | ICD-10-CM | POA: Diagnosis not present

## 2015-07-10 DIAGNOSIS — K645 Perianal venous thrombosis: Secondary | ICD-10-CM | POA: Diagnosis present

## 2015-07-10 DIAGNOSIS — N400 Enlarged prostate without lower urinary tract symptoms: Secondary | ICD-10-CM | POA: Diagnosis not present

## 2015-07-10 DIAGNOSIS — E039 Hypothyroidism, unspecified: Secondary | ICD-10-CM | POA: Diagnosis not present

## 2015-07-10 DIAGNOSIS — Z85828 Personal history of other malignant neoplasm of skin: Secondary | ICD-10-CM | POA: Diagnosis not present

## 2015-07-10 DIAGNOSIS — E785 Hyperlipidemia, unspecified: Secondary | ICD-10-CM | POA: Diagnosis not present

## 2015-07-10 DIAGNOSIS — Z87891 Personal history of nicotine dependence: Secondary | ICD-10-CM | POA: Diagnosis not present

## 2015-07-10 DIAGNOSIS — K219 Gastro-esophageal reflux disease without esophagitis: Secondary | ICD-10-CM | POA: Diagnosis not present

## 2015-07-10 HISTORY — DX: Unspecified hemorrhoids: K64.9

## 2015-07-10 LAB — BASIC METABOLIC PANEL
Anion gap: 8 (ref 5–15)
BUN: 17 mg/dL (ref 6–20)
CO2: 28 mmol/L (ref 22–32)
Calcium: 9.8 mg/dL (ref 8.9–10.3)
Chloride: 105 mmol/L (ref 101–111)
Creatinine, Ser: 1.21 mg/dL (ref 0.61–1.24)
GFR calc Af Amer: >60 mL/min (ref >60)
GFR calc non Af Amer: 57 mL/min — ABNORMAL LOW (ref >60)
Glucose, Bld: 100 mg/dL — ABNORMAL HIGH (ref 65–99)
Potassium: 4.1 mmol/L (ref 3.5–5.1)
Sodium: 141 mmol/L (ref 135–145)

## 2015-07-10 LAB — CBC WITH DIFFERENTIAL/PLATELET
Basophils Absolute: 0 10*3/uL (ref 0.0–0.1)
Basophils Relative: 0 %
Eosinophils Absolute: 0.2 10*3/uL (ref 0.0–0.7)
Eosinophils Relative: 2 %
HCT: 42.1 % (ref 39.0–52.0)
Hemoglobin: 14.3 g/dL (ref 13.0–17.0)
Lymphocytes Relative: 32 %
Lymphs Abs: 2.7 10*3/uL (ref 0.7–4.0)
MCH: 30 pg (ref 26.0–34.0)
MCHC: 34 g/dL (ref 30.0–36.0)
MCV: 88.3 fL (ref 78.0–100.0)
Monocytes Absolute: 0.5 10*3/uL (ref 0.1–1.0)
Monocytes Relative: 6 %
Neutro Abs: 5.1 10*3/uL (ref 1.7–7.7)
Neutrophils Relative %: 60 %
Platelets: 181 10*3/uL (ref 150–400)
RBC: 4.77 MIL/uL (ref 4.22–5.81)
RDW: 12.7 % (ref 11.5–15.5)
WBC: 8.5 10*3/uL (ref 4.0–10.5)

## 2015-07-10 NOTE — Telephone Encounter (Signed)
Patient has presented to short stay at my request. History as stated. I reviewed the procedure report.  I examined him and found him to have an approximately 5 mm thrombosed hemorrhoid which is exquisitely tender at the 6:00 position. I have paged Dr. Arnoldo Morale to see if we can get his assistance for an I&D while he is here.

## 2015-07-10 NOTE — Patient Instructions (Signed)
[name]  07/10/2015     @PREFPERIOPPHARMACY @   Your procedure is scheduled on 07/12/2015.  Report to [facility] at 8:50 A.M.  Call this number if you have problems the morning of surgery:  [phone]   Remember:  Do not eat food or drink liquids after midnight.  Take these medicines the morning of surgery with A SIP OF WATER flomax, Prilosec, Synthroid, Amlodipine   Do not wear jewelry, make-up or nail polish.  Do not wear lotions, powders, or perfumes.  You may wear deodorant.  Do not shave 48 hours prior to surgery.  Men may shave face and neck.  Do not bring valuables to the hospital.  [facility] is not responsible for any belongings or valuables.  Contacts, dentures or bridgework may not be worn into surgery.  Leave your suitcase in the car.  After surgery it may be brought to your room.  For patients admitted to the hospital, discharge time will be determined by your treatment team.  Patients discharged the day of surgery will not be allowed to drive home.    Please read over the following fact sheets that you were given. Surgical Site Infection Prevention and Anesthesia Post-op Instructions     PATIENT INSTRUCTIONS POST-ANESTHESIA  IMMEDIATELY FOLLOWING SURGERY:  Do not drive or operate machinery for the first twenty four hours after surgery.  Do not make any important decisions for twenty four hours after surgery or while taking narcotic pain medications or sedatives.  If you develop intractable nausea and vomiting or a severe headache please notify your doctor immediately.  FOLLOW-UP:  Please make an appointment with your surgeon as instructed. You do not need to follow up with anesthesia unless specifically instructed to do so.  WOUND CARE INSTRUCTIONS (if applicable):  Keep a dry clean dressing on the anesthesia/puncture wound site if there is drainage.  Once the wound has quit draining you may leave it open to air.  Generally you should leave the bandage  intact for twenty four hours unless there is drainage.  If the epidural site drains for more than 36-48 hours please call the anesthesia department.  QUESTIONS?:  Please feel free to call your physician or the hospital operator if you have any questions, and they will be happy to assist you.      Surgical Procedures for Hemorrhoids Surgical procedures can be used to treat hemorrhoids. Hemorrhoids are swollen veins that are inside the rectum (internal hemorrhoids) or around the anus (external hemorrhoids). They are caused by increased pressure in the anal area. This pressure may result from straining to have a bowel movement (constipation), diarrhea, pregnancy, obesity, anal sex, or sitting for long periods of time. Hemorrhoids can cause symptoms such as pain and bleeding. Surgery may be needed if diet changes, lifestyle changes, and other treatments do not help your symptoms. Various surgical methods may be used. Three common methods are:  Closed hemorrhoidectomy. The hemorrhoids are surgically removed, and the surgical cuts (incisions) are closed with stitches (sutures).  Open hemorrhoidectomy. The hemorrhoids are surgically removed, but the incisions are allowed to heal without sutures.  Stapled hemorrhoidopexy. The hemorrhoids are removed using a device that takes out a ring of excess tissue. LET [facility] CARE PROVIDER KNOW ABOUT:  Any allergies you have.  All medicines you are taking, including vitamins, herbs, eye drops, creams, and over-the-counter medicines.  Previous problems you or members of your family have had with the use of anesthetics.  Any blood disorders  you have.  Previous surgeries you have had.  Any medical conditions you have.  Whether you are pregnant or may be pregnant. RISKS AND COMPLICATIONS Generally, this is a safe procedure. However, problems may occur, including:  Infection.  Bleeding.  Allergic reactions to medicines.  Damage to other structures  or organs.  Pain.  Constipation.  Difficulty passing urine.  Narrowing of the anal canal (stenosis).  Difficulty controlling bowel movements (incontinence). BEFORE THE PROCEDURE  Ask your health care provider about:  Changing or stopping your regular medicines. This is especially important if you are taking diabetes medicines or blood thinners.  Taking medicines such as aspirin and ibuprofen. These medicines can thin your blood. Do not take these medicines before your procedure if your health care provider instructs you not to.  You may need to have a procedure to examine the inside of your colon with a scope (colonoscopy). Your health care provider may do this to make sure that there are no other causes for your bleeding or pain.  Follow instructions from your health care provider about eating or drinking restrictions.  You may be instructed to take a laxative and an enema to clean out your colon before surgery (bowel prep). Carefully follow instructions from your health care provider about bowel prep.  Ask your health care provider how your surgical site will be marked or identified.  You may be given antibiotic medicine to help prevent infection.  Plan to have someone take you home after the procedure. PROCEDURE  To reduce your risk of infection:  Your health care team will wash or sanitize their hands.  Your skin will be washed with soap.  An IV tube will be inserted into one of your veins.  You will be given one or more of the following:  A medicine to help you relax (sedative).  A medicine to numb the area (local anesthetic).  A medicine to make you fall asleep (general anesthetic).  A medicine that is injected into an area of your body to numb everything below the injection site (regional anesthetic).  A lubricating jelly may be placed into your rectum.  Your surgeon will insert a short scope (anoscope) into your rectum to examine the hemorrhoids.  One of  the following hemorrhoid procedures will be performed. Closed Hemorrhoidectomy  Your surgeon will use surgical instruments to open the tissue around the hemorrhoids.  The veins that supply the hemorrhoids will be tied off with a suture.  The hemorrhoids will be removed.  The tissue that surrounds the hemorrhoids will be closed with sutures that your body can absorb (absorbable sutures). Open Hemorrhoidectomy  The hemorrhoids will be removed with surgical instruments.  The incisions will be left open to heal without sutures. Stapled Hemorrhoidopexy  Your surgeon will use a circular stapling device to remove the hemorrhoids.  The device will be inserted into your anus. It will remove a circular ring of tissue that includes hemorrhoid tissue and some tissue above the hemorrhoids.  The staples in the device will close the edges of removed tissue. This will cut off the blood supply to the hemorrhoids and will pull any remaining hemorrhoids back into place. Each of these procedures may vary among health care providers and hospitals. AFTER THE PROCEDURE  Your blood pressure, heart rate, breathing rate, and blood oxygen level will be monitored often until the medicines you were given have worn off.  You will be given pain medicine as needed.   This information is not intended  to replace advice given to you by your health care provider. Make sure you discuss any questions you have with your health care provider.   Document Released: 04/19/2009 Document Revised: 03/13/2015 Document Reviewed: 09/17/2014 Elsevier Interactive Patient Education [employer].

## 2015-07-10 NOTE — Telephone Encounter (Signed)
Spoke with RMR- he wants pt to come to endo and let him look at it. I called the pt and he is aware and will go to endo. Pt lives in [place] and will be there asap. He is also aware that if RMR is in the middle of a tcs that he will be with him as soon as he gets done.

## 2015-07-10 NOTE — H&P (Signed)
[name] is an 75 y.o. male.   Chief Complaint: Thrombosed hemorrhoid HPI: Patient is a 75 year old white male who underwent a colonoscopy yesterday who presented this morning with a thrombosed hemorrhoid.  He has tried soaking in a tub to relieve the swelling.  Past Medical History  Diagnosis Date  . Hypertension   . Hyperlipidemia   . Gout   . Hypothyroidism     Following partial thyroidectomy  . Hx of adenomatous colonic polyps   . Barrett's esophagus   . Bradycardia 2009    Sinus rhythm in the 40s without symptoms;  . 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 Arthritis     "fingers; knees; toes" (01/05/2014)  . Squamous carcinoma (HCC)     skin cancer benign arms and right ear and rightside lip  . History of skin cancer   . GERD (gastroesophageal reflux disease)     ulcerative/erosive  . BPH (benign prostatic hyperplasia)   . Hiatal hernia   . Hemorrhoids     Past Surgical History  Procedure Laterality Date  . Thyroidectomy, partial Right 1969    lobe-nodule present  . Abdominal hernia repair  1990's  . Esophagogastroduodenoscopy  01/2006    geographic erosive RE, noncritical peptic stricture s/p dilation, antral erosion  . Colonoscopy  12/2005    normal  . Colonoscopy  2004    tubulovillous adenoma of rectum   . Esophagogastroduodenoscopy  01/12/2011    Dr. Gala Romney- hiatal hernia, distal esophageal erosions, barretts esophagus  . Maloney dilation  01/12/2011    Procedure: [name] DILATION;  Surgeon: Daneil Dolin, MD;  [facility]: [facility];  Service: Endoscopy;  Laterality: N/A;  56 french  . Colonoscopy  01/12/2011    Dr. Gala Romney- normal rectum  . Esophagogastroduodenoscopy  01/27/2012    Dr.Rourk- short segment barretts esophagus, erosive reflux esophagitis. bx= barretts esophagus, negative for dysplasia  . Cataract extraction w/ intraocular lens  implant, bilateral Bilateral 2012-2014  . Hernia repair    . Umbilical hernia repair   2000  . Elbow arthroscopy Left 2014  . Squamous cell carcinoma excision      "cut off my hands; left arm; under right lip; right ear" (01/05/2014)  . 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 Total knee arthroplasty Right 05/04/2014    Procedure: RIGHT TOTAL KNEE ARTHROPLASTY;  Surgeon: Mcarthur Rossetti, MD;  Location: [facility];  Service: Orthopedics;  Laterality: Right;  . Esophagogastroduodenoscopy N/A 02/13/2015    Dr.Rourk- ulcerative/erosive reflux esophagitis, abnormal esophagitis c/w prior diagnosis of barretts esophagus, hiatal hernia. bx=barretts esophagus  . Esophageal dilation N/A 02/13/2015    Procedure: ESOPHAGEAL DILATION;  Surgeon: Daneil Dolin, MD;  [facility];  Service: Endoscopy;  Laterality: N/A;    Family History  Problem Relation Age of Onset  . Colon cancer Neg Hx   . Liver disease Neg Hx   . Lung cancer Father 82  . Colon polyps Brother    Social History:  reports that he has quit smoking. His smoking use included Cigarettes. He has a 20 pack-year smoking history. He has never used smokeless tobacco. He reports that he drinks about 0.6 oz of alcohol per week. He reports that he does not use illicit drugs.  Allergies: No Known Allergies  No prescriptions prior to  admission    No results found for this or any previous visit (from the past 48 hour(s)). No results found.  Review of Systems  Gastrointestinal:       Rectal pain  All other systems reviewed and are negative.   There were no vitals taken for this visit. Physical Exam  Constitutional: He is oriented to person, place, and time. He appears well-developed and well-nourished.  HENT:  Head: Normocephalic and atraumatic.  Neck: Normal range of motion.  Cardiovascular: Normal rate, regular rhythm and normal heart sounds.   Respiratory: Effort  normal and breath sounds normal.  GI: Soft. Bowel sounds are normal.  Large thrombosed hemorrhoid noted along the right lateral aspect of the anus.  Neurological: He is alert and oriented to person, place, and time.  Skin: Skin is warm and dry.     Assessment/Plan Impression: Thrombosed hemorrhoid Plan: Patient be taken to the operating room on 07/12/2015 for a hemorrhoidectomy. The risks and benefits of the procedure including bleeding, infection, and the possibility of recurrence of hemorrhoidal disease were fully explained to the patient, who gave informed consent.  [name] A 07/10/2015, 11:04 AM

## 2015-07-10 NOTE — Telephone Encounter (Signed)
Pt called- he had a tcs yesterday and he now has a " huge bubble" coming from his rectum. He said it hurts really bad and when he bends over he can see it coming from his rectum. He has not had a bm yet, but he cant sit down because it hurts too bad. It is not bleeding. No fever.  Paging RMR.  Home number- [phone] Cell number- [phone]

## 2015-07-11 ENCOUNTER — Encounter (HOSPITAL_COMMUNITY): Payer: Self-pay | Admitting: Internal Medicine

## 2015-07-12 ENCOUNTER — Encounter (HOSPITAL_COMMUNITY): Payer: Self-pay | Admitting: *Deleted

## 2015-07-12 ENCOUNTER — Ambulatory Visit (HOSPITAL_COMMUNITY): Payer: Medicare Other | Admitting: Anesthesiology

## 2015-07-12 ENCOUNTER — Ambulatory Visit (HOSPITAL_COMMUNITY)
Admission: RE | Admit: 2015-07-12 | Discharge: 2015-07-12 | Disposition: A | Discharge status: Home / Self Care | Payer: Medicare Other | Source: Ambulatory Visit | Attending: General Surgery | Admitting: General Surgery

## 2015-07-12 ENCOUNTER — Encounter (HOSPITAL_COMMUNITY)
Admission: RE | Disposition: A | Discharge status: Home / Self Care | Payer: Self-pay | Source: Ambulatory Visit | Attending: General Surgery

## 2015-07-12 DIAGNOSIS — E039 Hypothyroidism, unspecified: Secondary | ICD-10-CM | POA: Insufficient documentation

## 2015-07-12 DIAGNOSIS — Z85828 Personal history of other malignant neoplasm of skin: Secondary | ICD-10-CM | POA: Insufficient documentation

## 2015-07-12 DIAGNOSIS — K645 Perianal venous thrombosis: Secondary | ICD-10-CM | POA: Insufficient documentation

## 2015-07-12 DIAGNOSIS — N400 Enlarged prostate without lower urinary tract symptoms: Secondary | ICD-10-CM | POA: Insufficient documentation

## 2015-07-12 DIAGNOSIS — E785 Hyperlipidemia, unspecified: Secondary | ICD-10-CM | POA: Insufficient documentation

## 2015-07-12 DIAGNOSIS — I10 Essential (primary) hypertension: Secondary | ICD-10-CM | POA: Insufficient documentation

## 2015-07-12 DIAGNOSIS — Z87891 Personal history of nicotine dependence: Secondary | ICD-10-CM | POA: Insufficient documentation

## 2015-07-12 DIAGNOSIS — K219 Gastro-esophageal reflux disease without esophagitis: Secondary | ICD-10-CM | POA: Insufficient documentation

## 2015-07-12 HISTORY — PX: HEMORRHOID SURGERY: SHX153

## 2015-07-12 SURGERY — HEMORRHOIDECTOMY
Anesthesia: General

## 2015-07-12 MED ORDER — ONDANSETRON HCL 4 MG/2ML IJ SOLN: 4.0000 mg | Freq: Once | INTRAMUSCULAR | Status: DC | PRN

## 2015-07-12 MED ORDER — FENTANYL CITRATE (PF) 100 MCG/2ML IJ SOLN
INTRAMUSCULAR | Status: AC
  Filled 2015-07-12: qty 2

## 2015-07-12 MED ORDER — FENTANYL CITRATE (PF) 100 MCG/2ML IJ SOLN
INTRAMUSCULAR | Status: DC | PRN
  Administered 2015-07-12: 50 ug via INTRAVENOUS

## 2015-07-12 MED ORDER — METRONIDAZOLE IN NACL 5-0.79 MG/ML-% IV SOLN
INTRAVENOUS | Status: AC
  Filled 2015-07-12: qty 100

## 2015-07-12 MED ORDER — LIDOCAINE HCL (PF) 1 % IJ SOLN
INTRAMUSCULAR | Status: AC
  Filled 2015-07-12: qty 15

## 2015-07-12 MED ORDER — ONDANSETRON HCL 4 MG/2ML IJ SOLN
INTRAMUSCULAR | Status: AC
  Filled 2015-07-12: qty 2

## 2015-07-12 MED ORDER — MIDAZOLAM HCL 2 MG/2ML IJ SOLN
1.0000 mg | INTRAMUSCULAR | Status: DC | PRN
  Administered 2015-07-12 (×2): 2 mg via INTRAVENOUS

## 2015-07-12 MED ORDER — GLYCOPYRROLATE 0.2 MG/ML IJ SOLN
INTRAMUSCULAR | Status: AC
  Filled 2015-07-12: qty 1

## 2015-07-12 MED ORDER — PROPOFOL 10 MG/ML IV BOLUS
INTRAVENOUS | Status: AC
  Filled 2015-07-12: qty 20

## 2015-07-12 MED ORDER — MIDAZOLAM HCL 2 MG/2ML IJ SOLN
INTRAMUSCULAR | Status: AC
  Filled 2015-07-12: qty 2

## 2015-07-12 MED ORDER — FENTANYL CITRATE (PF) 100 MCG/2ML IJ SOLN: 25.0000 ug | INTRAMUSCULAR | Status: DC | PRN

## 2015-07-12 MED ORDER — LIDOCAINE VISCOUS 2 % MT SOLN
OROMUCOSAL | Status: DC | PRN
  Administered 2015-07-12: 1 via OROMUCOSAL

## 2015-07-12 MED ORDER — PROPOFOL 10 MG/ML IV BOLUS
INTRAVENOUS | Status: DC | PRN
  Administered 2015-07-12: 150 mg via INTRAVENOUS
  Administered 2015-07-12: 50 mg via INTRAVENOUS

## 2015-07-12 MED ORDER — LACTATED RINGERS IV SOLN
INTRAVENOUS | Status: DC
Start: 2015-07-12 — End: 2015-07-12
  Administered 2015-07-12: 08:00:00 via INTRAVENOUS

## 2015-07-12 MED ORDER — ROCURONIUM BROMIDE 50 MG/5ML IV SOLN
INTRAVENOUS | Status: AC
  Filled 2015-07-12: qty 1

## 2015-07-12 MED ORDER — LIDOCAINE HCL (CARDIAC) 10 MG/ML IV SOLN
INTRAVENOUS | Status: DC | PRN
  Administered 2015-07-12: 50 mg via INTRAVENOUS

## 2015-07-12 MED ORDER — BUPIVACAINE HCL (PF) 0.5 % IJ SOLN
INTRAMUSCULAR | Status: DC | PRN
  Administered 2015-07-12: 10 mL

## 2015-07-12 MED ORDER — GLYCOPYRROLATE 0.2 MG/ML IJ SOLN
0.2000 mg | Freq: Once | INTRAMUSCULAR | Status: AC
  Administered 2015-07-12: 0.2 mg via INTRAVENOUS

## 2015-07-12 MED ORDER — SODIUM CHLORIDE 0.9 % IR SOLN
Status: DC | PRN
  Administered 2015-07-12: 500 mL

## 2015-07-12 MED ORDER — ONDANSETRON HCL 4 MG/2ML IJ SOLN
4.0000 mg | Freq: Once | INTRAMUSCULAR | Status: AC
  Administered 2015-07-12: 4 mg via INTRAVENOUS

## 2015-07-12 MED ORDER — KETOROLAC TROMETHAMINE 30 MG/ML IJ SOLN
30.0000 mg | Freq: Once | INTRAMUSCULAR | Status: AC
  Administered 2015-07-12: 30 mg via INTRAVENOUS
  Filled 2015-07-12: qty 1

## 2015-07-12 MED ORDER — FENTANYL CITRATE (PF) 100 MCG/2ML IJ SOLN
25.0000 ug | INTRAMUSCULAR | Status: AC
  Administered 2015-07-12 (×2): 25 ug via INTRAVENOUS

## 2015-07-12 MED ORDER — METRONIDAZOLE IN NACL 5-0.79 MG/ML-% IV SOLN
500.0000 mg | INTRAVENOUS | Status: AC
  Administered 2015-07-12: 500 mg via INTRAVENOUS

## 2015-07-12 SURGICAL SUPPLY — 33 items
BAG HAMPER (MISCELLANEOUS) ×3 IMPLANT
CLOTH BEACON ORANGE TIMEOUT ST (SAFETY) ×3 IMPLANT
COVER LIGHT HANDLE STERIS (MISCELLANEOUS) ×6 IMPLANT
COVER MAYO STAND XLG (DRAPE) ×3 IMPLANT
DECANTER SPIKE VIAL GLASS SM (MISCELLANEOUS) ×3 IMPLANT
DRAPE PROXIMA HALF (DRAPES) ×3 IMPLANT
ELECT REM PT RETURN 9FT ADLT (ELECTROSURGICAL) ×3
ELECTRODE REM PT RTRN 9FT ADLT (ELECTROSURGICAL) ×1 IMPLANT
FORMALIN 10 PREFIL 120ML (MISCELLANEOUS) ×3 IMPLANT
GAUZE SPONGE 4X4 12PLY STRL (GAUZE/BANDAGES/DRESSINGS) ×3 IMPLANT
GLOVE BIOGEL PI IND STRL 7.0 (GLOVE) ×1 IMPLANT
GLOVE BIOGEL PI INDICATOR 7.0 (GLOVE) ×2
GLOVE ECLIPSE 7.0 STRL STRAW (GLOVE) ×2 IMPLANT
GLOVE INDICATOR 7.0 STRL GRN (GLOVE) ×2 IMPLANT
GLOVE SURG SS PI 7.5 STRL IVOR (GLOVE) ×6 IMPLANT
GOWN STRL REUS W/ TWL XL LVL3 (GOWN DISPOSABLE) ×1 IMPLANT
GOWN STRL REUS W/TWL LRG LVL3 (GOWN DISPOSABLE) ×3 IMPLANT
GOWN STRL REUS W/TWL XL LVL3 (GOWN DISPOSABLE) ×3
HEMOSTAT SURGICEL 4X8 (HEMOSTASIS) ×3 IMPLANT
KIT ROOM TURNOVER AP CYSTO (KITS) ×3 IMPLANT
LIGASURE IMPACT 36 18CM CVD LR (INSTRUMENTS) ×2 IMPLANT
MANIFOLD NEPTUNE II (INSTRUMENTS) ×3 IMPLANT
NDL HYPO 25X1 1.5 SAFETY (NEEDLE) ×1 IMPLANT
NEEDLE HYPO 25X1 1.5 SAFETY (NEEDLE) ×3 IMPLANT
NS IRRIG 1000ML POUR BTL (IV SOLUTION) ×3 IMPLANT
PACK PERI GYN (CUSTOM PROCEDURE TRAY) ×3 IMPLANT
PAD ARMBOARD 7.5X6 YLW CONV (MISCELLANEOUS) ×3 IMPLANT
SET BASIN LINEN APH (SET/KITS/TRAYS/PACK) ×3 IMPLANT
SPONGE GAUZE 4X4 12PLY (GAUZE/BANDAGES/DRESSINGS) ×1 IMPLANT
SURGILUBE 3G PEEL PACK STRL (MISCELLANEOUS) ×3 IMPLANT
SUT SILK 0 FSL (SUTURE) ×3 IMPLANT
SUT VIC AB 2-0 CT2 27 (SUTURE) IMPLANT
SYRINGE 10CC LL (SYRINGE) ×3 IMPLANT

## 2015-07-12 NOTE — Progress Notes (Signed)
Awake. Coke given to drink. Tolerated well. Rectal packing d/c'd. No rectal drainage. Tolerated well.

## 2015-07-12 NOTE — Interval H&P Note (Signed)
History and Physical Interval Note:  07/12/2015 8:50 AM  [name]  has presented today for surgery, with the diagnosis of thrombosed hemorrhoid  The various methods of treatment have been discussed with the patient and family. After consideration of risks, benefits and other options for treatment, the patient has consented to  Procedure(s): SIMPLE HEMORRHOIDECTOMY (N/A) as a surgical intervention .  The patient's history has been reviewed, patient examined, no change in status, stable for surgery.  I have reviewed the patient's chart and labs.  Questions were answered to the patient's satisfaction.     Aviva Signs A

## 2015-07-12 NOTE — Transfer of Care (Signed)
Immediate Anesthesia Transfer of Care Note  Patient: [name]  Procedure(s) Performed: Procedure(s): SIMPLE HEMORRHOIDECTOMY (N/A)  Patient Location: PACU  Anesthesia Type:General  Level of Consciousness: awake and patient cooperative  Airway & Oxygen Therapy: Patient Spontanous Breathing and non-rebreather face mask  Post-op Assessment: Report given to RN, Post -op Vital signs reviewed and stable and Patient moving all extremities  Post vital signs: Reviewed and stable   Complications: No apparent anesthesia complications

## 2015-07-12 NOTE — Op Note (Signed)
Patient:  [name]  DOB:  05-Oct-1940  MRN:  [id number]   Preop Diagnosis:  Thrombosed hemorrhoid  Postop Diagnosis:  Same  Procedure:  Hemorrhoidectomy  Surgeon:  Aviva Signs, M.D.  Anes:  Gen.  Indications:  Patient is a 75 year old white male who presents with thrombosed external hemorrhoid along the right lateral aspect of the anus. The risks and benefits of the procedure including bleeding, infection, and recurrence of the hemorrhoid were fully explained to the patient, who gave informed consent.  Procedure note:  The patient was placed in the lithotomy position after general anesthesia was administered. The perineum was prepped and draped using the usual sterile technique with Betadine. Surgical site confirmation was performed.  On rectal examination, a thrombosed hemorrhoid was noted at the 7:00 position. Minimal other internal hemorrhoidal disease was noted. The thrombosed hemorrhoid was excised using the LigaSure without difficulty. The specimen was then spent to pathology further examination. No abnormal bleeding was noted. The sphincter tone was noted to be intact. 0.5% Sensorcaine was instilled into the surrounding perineum. Surgicel and Viscous Xylocaine rectal packing was then placed.  All tape and needle counts were correct at the end of the procedure. The patient was awakened and transferred to PACU in stable condition.  Complications:  None  EBL:  Minimal  Specimen:  Hemorrhoid

## 2015-07-12 NOTE — Discharge Instructions (Signed)
Surgical Procedures for Hemorrhoids, Care After °Refer to this sheet in the next few weeks. These instructions provide you with information about caring for yourself after your procedure. Your health care provider may also give you more specific instructions. Your treatment has been planned according to current medical practices, but problems sometimes occur. Call your health care provider if you have any problems or questions after your procedure. °WHAT TO EXPECT AFTER THE PROCEDURE °After your procedure, it is common to have: °· Rectal pain. °· Pain when you are having a bowel movement. °· Slight rectal bleeding. °HOME CARE INSTRUCTIONS °Medicines °· Take over-the-counter and prescription medicines only as told by your health care provider. °· Do not drive or operate heavy machinery while taking prescription pain medicine. °· Use a stool softener or a bulk laxative as told by your health care provider. °Activities °· Rest at home. Return to your normal activities as told by your health care provider. °· Do not lift anything that is heavier than 10 lb (4.5 kg). °· Do not sit for long periods of time. Take a walk every day or as told by your health care provider. °· Do not strain to have a bowel movement. Do not spend a long time sitting on the toilet. °Eating and Drinking °· Eat foods that contain fiber, such as whole grains, beans, nuts, fruits, and vegetables. °· Drink enough fluid to keep your urine clear or pale yellow. °General Instructions °· Sit in a warm bath 2-3 times per day to relieve soreness or itching. °· Keep all follow-up visits as told by your health care provider. This is important. °SEEK MEDICAL CARE IF: °· Your pain medicine is not helping. °· You have a fever or chills. °· You become constipated. °· You have trouble passing urine. °SEEK IMMEDIATE MEDICAL CARE IF: °· You have very bad rectal pain. °· You have heavy bleeding from your rectum. °  °This information is not intended to replace advice  given to you by your health care provider. Make sure you discuss any questions you have with your health care provider. °  °Document Released: 09/12/2003 Document Revised: 03/13/2015 Document Reviewed: 09/17/2014 °Elsevier Interactive Patient Education ©2016 [employer] ° °PATIENT INSTRUCTIONS °POST-ANESTHESIA ° °IMMEDIATELY FOLLOWING SURGERY:  Do not drive or operate machinery for the first twenty four hours after surgery.  Do not make any important decisions for twenty four hours after surgery or while taking narcotic pain medications or sedatives.  If you develop intractable nausea and vomiting or a severe headache please notify your doctor immediately. ° °FOLLOW-UP:  Please make an appointment with your surgeon as instructed. You do not need to follow up with anesthesia unless specifically instructed to do so. ° °WOUND CARE INSTRUCTIONS (if applicable):  Keep a dry clean dressing on the anesthesia/puncture wound site if there is drainage.  Once the wound has quit draining you may leave it open to air.  Generally you should leave the bandage intact for twenty four hours unless there is drainage.  If the epidural site drains for more than 36-48 hours please call the anesthesia department. ° °QUESTIONS?:  Please feel free to call your physician or the hospital operator if you have any questions, and they will be happy to assist you.    ° ° ° °

## 2015-07-12 NOTE — Anesthesia Preprocedure Evaluation (Signed)
Anesthesia Evaluation  Patient identified by MRN, date of birth, ID band Patient awake    Reviewed: Allergy & Precautions, NPO status , Patient's Chart, lab work & pertinent test results  Airway Mallampati: III  TM Distance: >3 FB     Dental  (+) Teeth Intact, Implants, Dental Advisory Given   Pulmonary former smoker,    breath sounds clear to auscultation       Cardiovascular hypertension, Pt. on medications  Rhythm:Regular Rate:Normal     Neuro/Psych    GI/Hepatic hiatal hernia, GERD  Medicated and Controlled,  Endo/Other  Hypothyroidism   Renal/GU      Musculoskeletal   Abdominal   Peds  Hematology   Anesthesia Other Findings   Reproductive/Obstetrics                             Anesthesia Physical Anesthesia Plan  ASA: II  Anesthesia Plan: General   Post-op Pain Management:    Induction: Intravenous  Airway Management Planned: LMA  Additional Equipment:   Intra-op Plan:   Post-operative Plan: Extubation in OR  Informed Consent: I have reviewed the patients History and Physical, chart, labs and discussed the procedure including the risks, benefits and alternatives for the proposed anesthesia with the patient or authorized representative who has indicated his/her understanding and acceptance.     Plan Discussed with:   Anesthesia Plan Comments:         Anesthesia Quick Evaluation

## 2015-07-12 NOTE — Anesthesia Procedure Notes (Signed)
Procedure Name: LMA Insertion Date/Time: 07/12/2015 9:33 AM Performed by: Vista Deck Pre-anesthesia Checklist: Patient identified, Patient being monitored, Emergency Drugs available, Timeout performed and Suction available Patient Re-evaluated:Patient Re-evaluated prior to inductionOxygen Delivery Method: Circle System Utilized Preoxygenation: Pre-oxygenation with 100% oxygen Intubation Type: IV induction Ventilation: Mask ventilation without difficulty LMA: LMA inserted LMA Size: 5.0 Number of attempts: 2 Placement Confirmation: positive ETCO2 and breath sounds checked- equal and bilateral Tube secured with: Tape Comments: 4 LMA inserted, leak. Removed and 5 LMA inserted. Good fir.

## 2015-07-12 NOTE — Anesthesia Postprocedure Evaluation (Signed)
Anesthesia Post Note  Patient: [name]  Procedure(s) Performed: Procedure(s) (LRB): SIMPLE HEMORRHOIDECTOMY (N/A)  Patient location during evaluation: PACU Anesthesia Type: General Level of consciousness: awake and alert Pain management: pain level controlled Vital Signs Assessment: post-procedure vital signs reviewed and stable Respiratory status: spontaneous breathing Cardiovascular status: stable Anesthetic complications: no    Last Vitals:  Filed Vitals:   07/12/15 1006 07/12/15 1015  BP: 118/93 122/84  Pulse:  77  Temp:    Resp: 16 17    Last Pain:  Filed Vitals:   07/12/15 1017  PainSc: 0-No pain                 Gaynelle Pastrana

## 2015-07-15 ENCOUNTER — Encounter (HOSPITAL_COMMUNITY): Payer: Self-pay | Admitting: General Surgery

## 2015-09-16 ENCOUNTER — Encounter: Payer: Self-pay | Admitting: *Deleted

## 2015-09-19 ENCOUNTER — Ambulatory Visit (INDEPENDENT_AMBULATORY_CARE_PROVIDER_SITE_OTHER): Payer: Medicare Other | Admitting: Cardiology

## 2015-09-19 ENCOUNTER — Encounter: Payer: Self-pay | Admitting: Cardiology

## 2015-09-19 ENCOUNTER — Encounter: Payer: Self-pay | Admitting: *Deleted

## 2015-09-19 VITALS — BP 146/79 | HR 56 | Ht 69.0 in | Wt 251.0 lb

## 2015-09-19 DIAGNOSIS — R600 Localized edema: Secondary | ICD-10-CM

## 2015-09-19 DIAGNOSIS — R0602 Shortness of breath: Secondary | ICD-10-CM

## 2015-09-19 DIAGNOSIS — I10 Essential (primary) hypertension: Secondary | ICD-10-CM | POA: Diagnosis not present

## 2015-09-19 DIAGNOSIS — E039 Hypothyroidism, unspecified: Secondary | ICD-10-CM

## 2015-09-19 MED ORDER — FUROSEMIDE 20 MG PO TABS: 20.0000 mg | ORAL_TABLET | Freq: Every day | ORAL | Status: DC

## 2015-09-19 NOTE — Patient Instructions (Signed)
Your physician recommends that you schedule a follow-up appointment in: [place] DR. [place]  Your physician has recommended you make the following change in your medication:   START LASIX 20 MG DAILY  Your physician recommends that you return for lab work in: [place] has requested that you have an echocardiogram. Echocardiography is a painless test that uses sound waves to create images of your heart. It provides your doctor with information about the size and shape of your heart and how well your heart's chambers and valves are working. This procedure takes approximately one hour. There are no restrictions for this procedure.  Thank you for choosing [facility]!!

## 2015-09-19 NOTE — Progress Notes (Signed)
Patient ID: [name], male   DOB: 27-Oct-1940, 75 y.o.   MRN: [id number]     Clinical Summary Mr. Fortunato is a 75 y.o.male seen today as a 75 y.o.male seen today as a new patient for the following medical problems. He was last seen by Dr Lattie Haw several years ago.  1. Bradycardia - denies any lightheadedness or dizziness, no syncope or presyncope.  2. HTN - compliant with meds  3. Leg edema - recent xray with some evidence of pulmonary edema per clinic note. Reports recent troubles with LE edema - recently started on lasix by pcp. Reports troubles with gout when on thiazide diuretics before.  - no orthopnea, no PND. Can have some DOE  Past Medical History  Diagnosis Date  . Hypertension   . Hyperlipidemia   . Gout   . Hypothyroidism     Following partial thyroidectomy  . Hx of adenomatous colonic polyps   . Barrett's esophagus   . Bradycardia 2009    Sinus rhythm in the 40s without symptoms;  . 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 Arthritis     "fingers; knees; toes" (01/05/2014)  . Squamous carcinoma (HCC)     skin cancer benign arms and right ear and rightside lip  . History of skin cancer   . GERD (gastroesophageal reflux disease)     ulcerative/erosive  . BPH (benign prostatic hyperplasia)   . Hiatal hernia   . Hemorrhoids      No Known Allergies   Current Outpatient Prescriptions  Medication Sig Dispense Refill  . amLODipine (NORVASC) 10 MG tablet Take 10 mg by mouth every morning.     Marland Kitchen levothyroxine (SYNTHROID, LEVOTHROID) 150 MCG tablet Take 150 mcg by mouth daily before breakfast.    . lisinopril-hydrochlorothiazide (PRINZIDE,ZESTORETIC) 20-12.5 MG per tablet Take 1 tablet by mouth 2 (two) times daily.     Marland Kitchen omeprazole (PRILOSEC) 40 MG capsule Take 40 mg by mouth daily.    . polyethylene glycol-electrolytes (TRILYTE) 420 G solution Take 4,000 mLs by mouth as directed. 4000 mL 0  . tamsulosin (FLOMAX) 0.4 MG CAPS capsule Take 1 capsule by mouth  daily.     No current facility-administered medications for this visit.     Past Surgical History  Procedure Laterality Date  . Thyroidectomy, partial Right 1969    lobe-nodule present  . Abdominal hernia repair  1990's  . Esophagogastroduodenoscopy  01/2006    geographic erosive RE, noncritical peptic stricture s/p dilation, antral erosion  . Colonoscopy  12/2005    normal  . Colonoscopy  2004    tubulovillous adenoma of rectum   . Esophagogastroduodenoscopy  01/12/2011    Dr. Gala Romney- hiatal hernia, distal esophageal erosions, barretts esophagus  . Maloney dilation  01/12/2011    Procedure: [name] DILATION;  Surgeon: Daneil Dolin, MD;  [facility];  Service: Endoscopy;  Laterality: N/A;  56 french  . Colonoscopy  01/12/2011    Dr. Gala Romney- normal rectum  . Esophagogastroduodenoscopy  01/27/2012    Dr.Rourk- short segment barretts esophagus, erosive reflux esophagitis. bx= barretts esophagus, negative for dysplasia  . Cataract extraction w/ intraocular lens  implant, bilateral Bilateral 2012-2014  . Hernia repair    . Umbilical hernia repair  2000  . Elbow arthroscopy Left 2014  . Squamous cell carcinoma excision      "cut off my hands; left arm; under right lip; right ear" (01/05/2014)  . Total knee arthroplasty Left 01/05/2014  Procedure: LEFT TOTAL KNEE ARTHROPLASTY;  Surgeon: Mcarthur Rossetti, MD;  Location: [facility];  Service: Orthopedics;  Laterality: Left;  . Total knee arthroplasty Right 05/04/2014    Procedure: RIGHT TOTAL KNEE ARTHROPLASTY;  Surgeon: Mcarthur Rossetti, MD;  Location[facility];  Service: Orthopedics;  Laterality: Right;  . Esophagogastroduodenoscopy N/A 02/13/2015    Dr.Rourk- ulcerative/erosive reflux esophagitis, abnormal esophagitis c/w prior diagnosis of barretts esophagus, hiatal hernia. bx=barretts esophagus  . Esophageal dilation N/A 02/13/2015    Procedure: ESOPHAGEAL DILATION;  Surgeon: Daneil Dolin, MD;  [facility];   Service: Endoscopy;  Laterality: N/A;  . Colonoscopy N/A 07/09/2015    Procedure: COLONOSCOPY;  Surgeon: Daneil Dolin, MD;  [facility];  Service: Endoscopy;  Laterality: N/A;  730   . Hemorrhoid surgery N/A 07/12/2015    Procedure: SIMPLE HEMORRHOIDECTOMY;  Surgeon: Aviva Signs, MD;  Location: [facility];  Service: General;  Laterality: N/A;     No Known Allergies    Family History  Problem Relation Age of Onset  . Colon cancer Neg Hx   . Liver disease Neg Hx   . Lung cancer Father 42  . Colon polyps Brother      Social History Mr. Mcclurg reports that he has quit smoking. His smoking use included Cigarettes. He has a 20 pack-year smoking history. He has never used smokeless tobacco. Mr. Yaffe reports that he drinks about 0.6 oz of alcohol per week.   Review of Systems CONSTITUTIONAL: No weight loss, fever, chills, weakness or fatigue.  HEENT: Eyes: No visual loss, blurred vision, double vision or yellow sclerae.No hearing loss, sneezing, congestion, runny nose or sore throat.  SKIN: No rash or itching.  CARDIOVASCULAR: per HPI RESPIRATORY: No shortness of breath, cough or sputum.  GASTROINTESTINAL: No anorexia, nausea, vomiting or diarrhea. No abdominal pain or blood.  GENITOURINARY: No burning on urination, no polyuria NEUROLOGICAL: No headache, dizziness, syncope, paralysis, ataxia, numbness or tingling in the extremities. No change in bowel or bladder control.  MUSCULOSKELETAL: No muscle, back pain, joint pain or stiffness.  LYMPHATICS: No enlarged nodes. No history of splenectomy.  PSYCHIATRIC: No history of depression or anxiety.  ENDOCRINOLOGIC: No reports of sweating, cold or heat intolerance. No polyuria or polydipsia.  Marland Kitchen   Physical Examination Filed Vitals:   09/19/15 1433  BP: 146/79  Pulse: 56   Filed Vitals:   09/19/15 1433  Height: 5\' 9"  (1.753 m)  Weight: 251 lb (113.853 kg)    Gen: resting comfortably, no acute distress HEENT: no scleral  icterus, pupils equal round and reactive, no palptable cervical adenopathy,  CV: RRR, no m/r/g, no jvd Resp: +SOB GI: abdomen is soft, non-tender, non-distended, normal bowel sounds, no hepatosplenomegaly MSK: extremities are warm, no edema.  Skin: warm, no rash Neuro:  no focal deficits Psych: appropriate affect   Diagnostic Studies  09/2012 stress echo Study Conclusions  - Stress ECG conclusions: Abnormal ST changes noted - 1 to 2 mm depression in leads II, III, aVF, V4-V6 at peak heart rate, 1 mm in recovery with resolution by 7 minutes. No chest pain with ST changes. No arrhythmias. The stress ECG was consistent with myocardial ischemia. [facility] scoring: exercise time of 28min; maximum ST deviation of 30mm; no angina; resulting score is -3. This score predicts a moderate risk of cardiac events. - Staged echo: There was no echocardiographic evidence for stress-induced ischemia. This would suggest the possibility of "false positive" ECG findings, perhaps repolarization abnormalities.  Assessment and Plan  1. Bradycardia - ekg in clinic shows mild sinus brady, denies any symptoms. Contiue to monitor  2. HTN - mildly above goal, we will follow as he is further diuresed  3. Leg edema - new onset leg swelling, CXR showed some pulmonary edema - will obtain echo - continue lasix, repeat labs in 2 weeks.   F/u 1 month      Arnoldo Lenis, M.D.

## 2015-09-25 ENCOUNTER — Ambulatory Visit (INDEPENDENT_AMBULATORY_CARE_PROVIDER_SITE_OTHER): Payer: Medicare Other

## 2015-09-25 ENCOUNTER — Other Ambulatory Visit: Payer: Self-pay

## 2015-09-25 DIAGNOSIS — R0602 Shortness of breath: Secondary | ICD-10-CM

## 2015-09-30 ENCOUNTER — Encounter: Payer: Self-pay | Admitting: *Deleted

## 2015-09-30 ENCOUNTER — Telehealth: Payer: Self-pay | Admitting: *Deleted

## 2015-09-30 NOTE — Telephone Encounter (Signed)
Pt aware, routed to pcp. Also will request lab work from [facility], pt says he has already had this done.

## 2015-09-30 NOTE — Telephone Encounter (Signed)
-----   Message from Arnoldo Lenis, MD sent at 09/30/2015  9:55 AM EDT ----- Echo overall looks good. There was an incidental finding of a liver cyst, please forward to pcp as he may need a liver ultrasound to better visualize   Zandra Abts MD

## 2015-10-01 ENCOUNTER — Telehealth: Payer: Self-pay | Admitting: Cardiology

## 2015-10-01 NOTE — Telephone Encounter (Signed)
Patient would like to know more about cyst on liver and where he can have testing done at that was mentioned to him

## 2015-10-01 NOTE — Telephone Encounter (Signed)
Patient advised that the only information we have is that a cyst was seen while doing echo and that he needed to follow up with his PCP about this. Patient wanted to know if he could have follow up testing done with a [facility] facility if needed. Nurse advised patient that he could do any needed follow up testing and referrals to GI doctors within the [facility] network. Patient verbalized understanding.

## 2015-10-08 ENCOUNTER — Ambulatory Visit (INDEPENDENT_AMBULATORY_CARE_PROVIDER_SITE_OTHER): Payer: Medicare Other | Admitting: Cardiology

## 2015-10-08 ENCOUNTER — Encounter: Payer: Self-pay | Admitting: Cardiology

## 2015-10-08 VITALS — BP 136/81 | HR 52 | Ht 69.0 in | Wt 248.0 lb

## 2015-10-08 DIAGNOSIS — I5032 Chronic diastolic (congestive) heart failure: Secondary | ICD-10-CM | POA: Diagnosis not present

## 2015-10-08 DIAGNOSIS — R001 Bradycardia, unspecified: Secondary | ICD-10-CM

## 2015-10-08 DIAGNOSIS — I10 Essential (primary) hypertension: Secondary | ICD-10-CM

## 2015-10-08 NOTE — Progress Notes (Signed)
Patient ID: [name], male   DOB: 1941-01-16, 75 y.o.   MRN: [id number]     Clinical Summary Mr. Vallee is a 75 y.o.male seen today for follow up of the following medical problems.   1. Bradycardia - denies any lightheadedness or dizziness, no syncope or presyncope since last visit  2. HTN - compliant with meds  3. Chronic diastolic HF - echo [id number] since last visit showed LVEF 123456, grade I diastolic dysfunction.  - recent xray with some evidence of pulmonary edema, as well as LE edema. Improved since starting lasix   4. Liver cyst - incidental finding by echo - he is to discuss with pcp   Past Medical History  Diagnosis Date  . Hypertension   . Hyperlipidemia   . Gout   . Hypothyroidism     Following partial thyroidectomy  . Hx of adenomatous colonic polyps   . Barrett's esophagus   . Bradycardia 2009    Sinus rhythm in the 40s without symptoms;  . 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 Arthritis     "fingers; knees; toes" (01/05/2014)  . Squamous carcinoma (HCC)     skin cancer benign arms and right ear and rightside lip  . History of skin cancer   . GERD (gastroesophageal reflux disease)     ulcerative/erosive  . BPH (benign prostatic hyperplasia)   . Hiatal hernia   . Hemorrhoids      No Known Allergies   Current Outpatient Prescriptions  Medication Sig Dispense Refill  . allopurinol (ZYLOPRIM) 100 MG tablet Take 1 tablet by mouth daily as needed.    Marland Kitchen amLODipine (NORVASC) 10 MG tablet Take 10 mg by mouth every morning.     . furosemide (LASIX) 20 MG tablet Take 0.5 tablets by mouth daily.    . furosemide (LASIX) 20 MG tablet Take 1 tablet (20 mg total) by mouth daily. 90 tablet 3  . levothyroxine (SYNTHROID, LEVOTHROID) 150 MCG tablet Take 150 mcg by mouth daily before breakfast.    . lisinopril (PRINIVIL,ZESTRIL) 40 MG tablet Take 1 tablet by mouth daily.    Marland Kitchen omeprazole (PRILOSEC) 20 MG capsule Take 20 mg by  mouth daily as needed.     . tamsulosin (FLOMAX) 0.4 MG CAPS capsule Take 1 capsule by mouth daily.     No current facility-administered medications for this visit.     Past Surgical History  Procedure Laterality Date  . Thyroidectomy, partial Right 1969    lobe-nodule present  . Abdominal hernia repair  1990's  . Esophagogastroduodenoscopy  01/2006    geographic erosive RE, noncritical peptic stricture s/p dilation, antral erosion  . Colonoscopy  12/2005    normal  . Colonoscopy  2004    tubulovillous adenoma of rectum   . Esophagogastroduodenoscopy  01/12/2011    Dr. Gala Romney- hiatal hernia, distal esophageal erosions, barretts esophagus  . [place]oney dilation  01/12/2011    Procedure: [name] DILATION;  Surgeon: Daneil Dolin, MD;  [facility];  Service: Endoscopy;  Laterality: N/A;  56 french  . Colonoscopy  01/12/2011    Dr. Gala Romney- normal rectum  . Esophagogastroduodenoscopy  01/27/2012    Dr.Rourk- short segment barretts esophagus, erosive reflux esophagitis. bx= barretts esophagus, negative for dysplasia  . Cataract extraction w/ intraocular lens  implant, bilateral Bilateral 2012-2014  . Hernia repair    . Umbilical hernia repair  2000  . Elbow arthroscopy Left 2014  .  Squamous cell carcinoma excision      "cut off my hands; left arm; under right lip; right ear" (01/05/2014)  . Total knee arthroplasty Left 01/05/2014    Procedure: LEFT TOTAL KNEE ARTHROPLASTY;  Surgeon: Mcarthur Rossetti, MD;  Location: [facility];  Service: Orthopedics;  Laterality: Left;  . Total knee arthroplasty Right 05/04/2014    Procedure: RIGHT TOTAL KNEE ARTHROPLASTY;  Surgeon: Mcarthur Rossetti, MD;  [facility];  Service: Orthopedics;  Laterality: Right;  . Esophagogastroduodenoscopy N/A 02/13/2015    Dr.Rourk- ulcerative/erosive reflux esophagitis, abnormal esophagitis c/w prior diagnosis of barretts esophagus, hiatal hernia. bx=barretts esophagus  . Esophageal dilation N/A 02/13/2015     Procedure: ESOPHAGEAL DILATION;  Surgeon: Daneil Dolin, MD;  [facility];  Service: Endoscopy;  Laterality: N/A;  . Colonoscopy N/A 07/09/2015    Procedure: COLONOSCOPY;  Surgeon: Daneil Dolin, MD;  [facility];  Service: Endoscopy;  Laterality: N/A;  730   . Hemorrhoid surgery N/A 07/12/2015    Procedure: SIMPLE HEMORRHOIDECTOMY;  Surgeon: Aviva Signs, MD;  [facility]: [facility];  Service: General;  Laterality: N/A;     No Known Allergies    Family History  Problem Relation Age of Onset  . Colon cancer Neg Hx   . Liver disease Neg Hx   . Lung cancer Father 32  . Colon polyps Brother      Social History Mr. Mckenzie reports that he quit smoking about 35 years ago. His smoking use included Cigarettes. He started smoking about 59 years ago. He has a 20 pack-year smoking history. He has never used smokeless tobacco. Mr. Kirtz reports that he drinks about 0.6 oz of alcohol per week.   Review of Systems CONSTITUTIONAL: No weight loss, fever, chills, weakness or fatigue.  HEENT: Eyes: No visual loss, blurred vision, double vision or yellow sclerae.No hearing loss, sneezing, congestion, runny nose or sore throat.  SKIN: No rash or itching.  CARDIOVASCULAR: per HPI RESPIRATORY: No shortness of breath, cough or sputum.  GASTROINTESTINAL: No anorexia, nausea, vomiting or diarrhea. No abdominal pain or blood.  GENITOURINARY: No burning on urination, no polyuria NEUROLOGICAL: No headache, dizziness, syncope, paralysis, ataxia, numbness or tingling in the extremities. No change in bowel or bladder control.  MUSCULOSKELETAL: No muscle, back pain, joint pain or stiffness.  LYMPHATICS: No enlarged nodes. No history of splenectomy.  PSYCHIATRIC: No history of depression or anxiety.  ENDOCRINOLOGIC: No reports of sweating, cold or heat intolerance. No polyuria or polydipsia.  Marland Kitchen   Physical Examination Filed Vitals:   10/08/15 1413  BP: 136/81  Pulse: 52   Filed  Vitals:   10/08/15 1413  Height: 5\' 9"  (1.753 m)  Weight: 248 lb (112.492 kg)    Gen: resting comfortably, no acute distress HEENT: no scleral icterus, pupils equal round and reactive, no palptable cervical adenopathy,  CV: RRR, no m/r/g, no jvd Resp: Clear to auscultation bilaterally GI: abdomen is soft, non-tender, non-distended, normal bowel sounds, no hepatosplenomegaly MSK: extremities are warm, no edema.  Skin: warm, no rash Neuro:  no focal deficits Psych: appropriate affect   Diagnostic Studies  09/2012 stress echo Study Conclusions  - Stress ECG conclusions: Abnormal ST changes noted - 1 to 2 mm depression in leads II, III, aVF, V4-V6 at peak heart rate, 1 mm in recovery with resolution by 7 minutes. No chest pain with ST changes. No arrhythmias. The stress ECG was consistent with myocardial ischemia. Duke scoring: exercise time of 98min; maximum ST  deviation of 82mm; no angina; resulting score is -3. This score predicts a moderate risk of cardiac events. - Staged echo: There was no echocardiographic evidence for stress-induced ischemia. This would suggest the possibility of "false positive" ECG findings, perhaps repolarization abnormalities.  09/2015 echo Study Conclusions  - Left ventricle: The cavity size was normal. Wall thickness was  normal. Systolic function was normal. The estimated ejection  fraction was in the range of 60% to 65%. Doppler parameters are  consistent with abnormal left ventricular relaxation (grade 1  diastolic dysfunction). - Aortic valve: Valve area (VTI): 3.8 cm^2. Valve area (Vmax): 3.69  cm^2. Valve area (Vmean): 3.61 cm^2. - Aorta: The visualized portion of the aortic arch is midly dilated  at 3.6 cm. - Mitral valve: There was mild regurgitation. - Left atrium: The atrium was moderately dilated. 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 - Technically difficult  study. Assessment and Plan   1. Bradycardia - mild asymptomatic sinus bradycardia, continue to monitor  2. HTN - at goal, continue current meds  3. Chronic diastolic HF - edema improved with lasix, he will conitnue  F/u 6 months     Arnoldo Lenis, M.D.

## 2015-10-08 NOTE — Patient Instructions (Signed)
Continue all current medications. Your physician wants you to follow up in: 6 months.  You will receive a reminder letter in the mail one-two months in advance.  If you don't receive a letter, please call our office to schedule the follow up appointment   

## 2016-03-25 ENCOUNTER — Other Ambulatory Visit: Payer: Self-pay

## 2016-03-26 MED ORDER — OMEPRAZOLE 40 MG PO CPDR: 40.0000 mg | DELAYED_RELEASE_CAPSULE | Freq: Every day | ORAL | 4 refills | Status: DC

## 2016-03-30 ENCOUNTER — Other Ambulatory Visit: Payer: Self-pay

## 2016-04-02 MED ORDER — OMEPRAZOLE 40 MG PO CPDR: 40.0000 mg | DELAYED_RELEASE_CAPSULE | Freq: Every day | ORAL | 3 refills | Status: DC

## 2016-07-20 ENCOUNTER — Ambulatory Visit (INDEPENDENT_AMBULATORY_CARE_PROVIDER_SITE_OTHER): Payer: Medicare Other | Admitting: Orthopaedic Surgery

## 2016-07-20 ENCOUNTER — Ambulatory Visit (INDEPENDENT_AMBULATORY_CARE_PROVIDER_SITE_OTHER): Payer: Self-pay

## 2016-07-20 ENCOUNTER — Ambulatory Visit (INDEPENDENT_AMBULATORY_CARE_PROVIDER_SITE_OTHER): Payer: Medicare Other

## 2016-07-20 DIAGNOSIS — Z96652 Presence of left artificial knee joint: Secondary | ICD-10-CM

## 2016-07-20 DIAGNOSIS — Z96651 Presence of right artificial knee joint: Secondary | ICD-10-CM | POA: Diagnosis not present

## 2016-07-20 NOTE — Progress Notes (Signed)
Office Visit Note   Patient: [name]           Date of Birth: [birth year]           MRN: [id number] Visit Date: 07/20/2016              Requested by: Michell Heinrich, [place] [address] PCP: ADDIS,DANIEL, DO   Assessment & Plan: Visit Diagnoses:  1. History of total left knee replacement   2. History of total right knee replacement     Plan: At this point he'll follow up as needed. He has no issues with his knees and he says they feel great. If he has any issues at all with anything or his knees do not hesitate to give [place] a call.  Follow-Up Instructions: Return if symptoms worsen or fail to improve.   Orders:  Orders Placed This Encounter  Procedures  . XR Knee 1-2 Views Left  . XR Knee 1-2 Views Right   No orders of the defined types were placed in this encounter.     Procedures: No procedures performed   Clinical Data: No additional findings.   Subjective: Chief Complaint  Patient presents with  . Left Knee - Follow-up    Patient states his knees are doing well. "cracks a lot", without pain  . Right Knee - Follow-up    HPI He is 3 years status post left total knee are +2 years status post a right total knee arthroplasty. Other than a little bit of mechanical symptoms in terms of cracking in the knees and neither of them hurt at all neither of them swell. He has no real issues he states. Review of Systems Denies any recent illnesses or fever or chills.  Objective: Vital Signs: There were no vitals taken for this visit.  Physical Exam Alert and oriented 3 with no acute distress. He walks without a limp. Ortho Exam Both knees are examined and are not read. Both knees and have excellent range of motion with no effusion. There is a little bit of clicking and crepitation at the superior lateral aspect of the right knee but he says his is a bother him at all and does not happen all the time. Both knees feel ligamentously stable. Specialty  Comments:  No specialty comments available.  Imaging: Xr Knee 1-2 Views Left  Result Date: 07/20/2016 2 views of the left knee AP and lateral show well-seated total knee replacement. There is no evidence of loosening or other Getting features. The overall alignment appears to be well-maintained.  Xr Knee 1-2 Views Right  Result Date: 07/20/2016 An AP and lateral of the right knee show a total knee replacement with good positioning of the implants. Overall alignment looks good. There is no evidence of loosening or, getting features. There is no effusion.    PMFS History: Patient Active Problem List   Diagnosis Date Noted  . History of colonic polyps   . Peptic stricture of esophagus   . Reflux esophagitis   . Arthritis of knee, right, primary OA 05/04/2014  . Status post total right knee replacement 05/04/2014  . Arthritis of knee, left 01/05/2014  . Status post total knee replacement 01/05/2014  . Barrett's esophagus 09/01/2012  . Chest discomfort + dyspnea 09/01/2012  . Hypertension   . Hyperlipidemia   . Hypothyroidism   . Bradycardia   . COLONIC POLYPS, ADENOMATOUS 04/12/2009  . GASTROESOPHAGEAL REFLUX DISEASE, CHRONIC 04/11/2009   Past Medical History:  Diagnosis Date  . Arthritis    "fingers; knees; toes" (01/05/2014)  . Barrett's esophagus   . BPH (benign prostatic hyperplasia)   . Bradycardia 2009   Sinus rhythm in the 40s without symptoms;  Marland Kitchen GERD (gastroesophageal reflux disease)    ulcerative/erosive  . Gout   . Hemorrhoids   . Hiatal hernia   . History of skin cancer   . Hx of adenomatous colonic polyps   . Hyperlipidemia   . Hypertension   . Hypothyroidism    Following partial thyroidectomy  . Squamous carcinoma    skin cancer benign arms and right ear and rightside lip  . Tobacco abuse, in remission    10-20 pack years; discontinued in 1981    Family History  Problem Relation Age of Onset  . Colon cancer Neg Hx   . Liver disease Neg Hx   . Lung  cancer Father 65  . Colon polyps Brother     Past Surgical History:  Procedure Laterality Date  . ABDOMINAL HERNIA REPAIR  1990's  . CATARACT EXTRACTION W/ INTRAOCULAR LENS  IMPLANT, BILATERAL Bilateral 2012-2014  . COLONOSCOPY  12/2005   normal  . COLONOSCOPY  2004   tubulovillous adenoma of rectum   . COLONOSCOPY  01/12/2011   Dr. Gala Romney- normal rectum  . COLONOSCOPY N/A 07/09/2015   Procedure: COLONOSCOPY;  Surgeon: Daneil Dolin, MD;  [facility];  Service: Endoscopy;  Laterality: N/A;  730   . ELBOW ARTHROSCOPY Left 2014  . ESOPHAGEAL DILATION N/A 02/13/2015   Procedure: ESOPHAGEAL DILATION;  Surgeon: Daneil Dolin, MD;  [facility];  Service: Endoscopy;  Laterality: N/A;  . ESOPHAGOGASTRODUODENOSCOPY  01/2006   geographic erosive RE, noncritical peptic stricture s/p dilation, antral erosion  . ESOPHAGOGASTRODUODENOSCOPY  01/12/2011   Dr. Gala Romney- hiatal hernia, distal esophageal erosions, barretts esophagus  . ESOPHAGOGASTRODUODENOSCOPY  01/27/2012   Dr.Rourk- short segment barretts esophagus, erosive reflux esophagitis. bx= barretts esophagus, negative for dysplasia  . ESOPHAGOGASTRODUODENOSCOPY N/A 02/13/2015   Dr.Rourk- ulcerative/erosive reflux esophagitis, abnormal esophagitis c/w prior diagnosis of barretts esophagus, hiatal hernia. bx=barretts esophagus  . HEMORRHOID SURGERY N/A 07/12/2015   Procedure: SIMPLE HEMORRHOIDECTOMY;  Surgeon: Aviva Signs, MD;  [facility];  Service: General;  Laterality: N/A;  . [place]  01/12/2011   Procedure: [name] DILATION;  Surgeon: Daneil Dolin, MD;  [facility];  Service: Endoscopy;  Laterality: N/A;  56 french  . SQUAMOUS CELL CARCINOMA EXCISION     "cut off my hands; left arm; under right lip; right ear" (01/05/2014)  . THYROIDECTOMY, PARTIAL Right 1969   lobe-nodule present  . TOTAL KNEE ARTHROPLASTY Left 01/05/2014   Procedure: LEFT TOTAL KNEE ARTHROPLASTY;  Surgeon:  Mcarthur Rossetti, MD;  Location: [facility];  Service: Orthopedics;  Laterality: Left;  . TOTAL KNEE ARTHROPLASTY Right 05/04/2014   Procedure: RIGHT TOTAL KNEE ARTHROPLASTY;  Surgeon: Mcarthur Rossetti, MD;  [facility];  Service: Orthopedics;  Laterality: Right;  . UMBILICAL HERNIA REPAIR  2000   Social History   Occupational History  . [employer]     Current employment  . [employer]     Prior employment   Social History Main Topics  . Smoking status: Former Smoker    Packs/day: 1.00    Years: 20.00    Types: Cigarettes    Start date: 07/06/1956    Quit date: 07/06/1980  . Smokeless tobacco: Never Used  Comment: "quit smoking in ~ 1982"  . Alcohol use 0.6 oz/week    1 Cans of beer per week     Comment: rare  . Drug use: No  . Sexual activity: Not on file

## 2016-11-20 ENCOUNTER — Ambulatory Visit (INDEPENDENT_AMBULATORY_CARE_PROVIDER_SITE_OTHER): Payer: Medicare Other | Admitting: Cardiology

## 2016-11-20 ENCOUNTER — Encounter: Payer: Self-pay | Admitting: *Deleted

## 2016-11-20 ENCOUNTER — Encounter: Payer: Self-pay | Admitting: Cardiology

## 2016-11-20 ENCOUNTER — Telehealth: Payer: Self-pay | Admitting: Cardiology

## 2016-11-20 VITALS — BP 160/73 | HR 68 | Ht 69.0 in | Wt 253.0 lb

## 2016-11-20 DIAGNOSIS — R0989 Other specified symptoms and signs involving the circulatory and respiratory systems: Secondary | ICD-10-CM | POA: Diagnosis not present

## 2016-11-20 DIAGNOSIS — R079 Chest pain, unspecified: Secondary | ICD-10-CM | POA: Diagnosis not present

## 2016-11-20 DIAGNOSIS — R001 Bradycardia, unspecified: Secondary | ICD-10-CM

## 2016-11-20 DIAGNOSIS — I10 Essential (primary) hypertension: Secondary | ICD-10-CM

## 2016-11-20 DIAGNOSIS — I5032 Chronic diastolic (congestive) heart failure: Secondary | ICD-10-CM

## 2016-11-20 DIAGNOSIS — I6529 Occlusion and stenosis of unspecified carotid artery: Secondary | ICD-10-CM

## 2016-11-20 MED ORDER — ATORVASTATIN CALCIUM 40 MG PO TABS: 40.0000 mg | ORAL_TABLET | Freq: Every day | ORAL | 6 refills | Status: DC

## 2016-11-20 NOTE — Telephone Encounter (Signed)
Exercise Cardiolite Dec 03, 2016 at [facility]  Carotid doppler in [place] December 17, 2016

## 2016-11-20 NOTE — Progress Notes (Signed)
Clinical Summary Mr. Legendre is a 76 y.o.male seen today for follow up of the following medical problems.   1. Bradycardia - occasional dizziness most with standing. Bottle of water x 4-5 a day. -he reports  pcp recently increased bp meds.    2. HTN - he remains compliant with meds  3. Chronic diastolic HF - echo 02/8501 since last visit showed LVEF 77-41%, grade I diastolic dysfunction.  - recent xray with some evidence of pulmonary edema, as well as LE edema. Improved since starting lasix  - occasiional LE edema. No significnat SOB/DOE.    4. Liver cyst - incidental finding by echo - he is to discuss with pcp   5. Hyperlipidemia - 09/2016 TC 220 HDL 33 TG 165 LDL 157   6. Chest pain - started about 6-8 months. Sharp pain left chest, 7-8/10. Lasts 10-15 seconds. No other associated symptoms. Occurs 2-3 times a week - mainly occurs with activity. Can have DOE with high levels of activity.  7. AAA screen' - male over 24 with smoking - 10/2015 ABd [place]: no AAA Past Medical History:  Diagnosis Date  . Arthritis    "fingers; knees; toes" (01/05/2014)  . Barrett's esophagus   . BPH (benign prostatic hyperplasia)   . Bradycardia 2009   Sinus rhythm in the 40s without symptoms;  Marland Kitchen GERD (gastroesophageal reflux disease)    ulcerative/erosive  . Gout   . Hemorrhoids   . Hiatal hernia   . History of skin cancer   . Hx of adenomatous colonic polyps   . Hyperlipidemia   . Hypertension   . Hypothyroidism    Following partial thyroidectomy  . Squamous carcinoma    skin cancer benign arms and right ear and rightside lip  . Tobacco abuse, in remission    10-20 pack years; discontinued in 1981     No Known Allergies   Current Outpatient Prescriptions  Medication Sig Dispense Refill  . allopurinol (ZYLOPRIM) 100 MG tablet Take 1 tablet by mouth daily as needed.    Marland Kitchen amLODipine (NORVASC) 10 MG tablet Take 10 mg by mouth every morning.     . furosemide (LASIX) 20  MG tablet Take 0.5 tablets by mouth daily.    . furosemide (LASIX) 20 MG tablet Take 1 tablet (20 mg total) by mouth daily. (Patient not taking: Reported on 07/20/2016) 90 tablet 3  . levothyroxine (SYNTHROID, LEVOTHROID) 150 MCG tablet Take 150 mcg by mouth daily before breakfast.    . lisinopril (PRINIVIL,ZESTRIL) 40 MG tablet Take 1 tablet by mouth daily.    Marland Kitchen omeprazole (PRILOSEC) 20 MG capsule Take 20 mg by mouth daily as needed.     Marland Kitchen omeprazole (PRILOSEC) 40 MG capsule Take 1 capsule (40 mg total) by mouth daily. 90 capsule 3  . tamsulosin (FLOMAX) 0.4 MG CAPS capsule Take 1 capsule by mouth daily.     No current facility-administered medications for this visit.      Past Surgical History:  Procedure Laterality Date  . ABDOMINAL HERNIA REPAIR  1990's  . CATARACT EXTRACTION W/ INTRAOCULAR LENS  IMPLANT, BILATERAL Bilateral 2012-2014  . COLONOSCOPY  12/2005   normal  . COLONOSCOPY  2004   tubulovillous adenoma of rectum   . COLONOSCOPY  01/12/2011   Dr. Gala Romney- normal rectum  . COLONOSCOPY N/A 07/09/2015   Procedure: COLONOSCOPY;  Surgeon: Daneil Dolin, MD;  [facility];  Service: Endoscopy;  Laterality: N/A;  730   . ELBOW ARTHROSCOPY  Left 2014  . ESOPHAGEAL DILATION N/A 02/13/2015   Procedure: ESOPHAGEAL DILATION;  Surgeon: Daneil Dolin, MD;  [facility];  Service: Endoscopy;  Laterality: N/A;  . ESOPHAGOGASTRODUODENOSCOPY  01/2006   geographic erosive RE, noncritical peptic stricture s/p dilation, antral erosion  . ESOPHAGOGASTRODUODENOSCOPY  01/12/2011   Dr. Gala Romney- hiatal hernia, distal esophageal erosions, barretts esophagus  . ESOPHAGOGASTRODUODENOSCOPY  01/27/2012   Dr.Rourk- short segment barretts esophagus, erosive reflux esophagitis. bx= barretts esophagus, negative for dysplasia  . ESOPHAGOGASTRODUODENOSCOPY N/A 02/13/2015   Dr.Rourk- ulcerative/erosive reflux esophagitis, abnormal esophagitis c/w prior diagnosis of barretts esophagus, hiatal hernia.  bx=barretts esophagus  . HEMORRHOID SURGERY N/A 07/12/2015   Procedure: SIMPLE HEMORRHOIDECTOMY;  Surgeon: Aviva Signs, MD;  [facility];  Service: General;  Laterality: N/A;  . [place]  01/12/2011   Procedure: [name] DILATION;  Surgeon: Daneil Dolin, MD;  [facility];  Service: Endoscopy;  Laterality: N/A;  56 french  . SQUAMOUS CELL CARCINOMA EXCISION     "cut off my hands; left arm; under right lip; right ear" (01/05/2014)  . THYROIDECTOMY, PARTIAL Right 1969   lobe-nodule present  . TOTAL KNEE ARTHROPLASTY Left 01/05/2014   Procedure: LEFT TOTAL KNEE ARTHROPLASTY;  Surgeon: Mcarthur Rossetti, MD;  Location: [facility];  Service: Orthopedics;  Laterality: Left;  . TOTAL KNEE ARTHROPLASTY Right 05/04/2014   Procedure: RIGHT TOTAL KNEE ARTHROPLASTY;  Surgeon: Mcarthur Rossetti, MD;  [facility];  Service: Orthopedics;  Laterality: Right;  . UMBILICAL HERNIA REPAIR  2000     No Known Allergies    Family History  Problem Relation Age of Onset  . Lung cancer Father 43  . Colon polyps Brother   . Colon cancer Neg Hx   . Liver disease Neg Hx      Social History Mr. Rake reports that he quit smoking about 36 years ago. His smoking use included Cigarettes. He started smoking about 60 years ago. He has a 20.00 pack-year smoking history. He has never used smokeless tobacco. Mr. Fenlon reports that he drinks about 0.6 oz of alcohol per week .   Review of Systems CONSTITUTIONAL: No weight loss, fever, chills, weakness or fatigue.  HEENT: Eyes: No visual loss, blurred vision, double vision or yellow sclerae.No hearing loss, sneezing, congestion, runny nose or sore throat.  SKIN: No rash or itching.  CARDIOVASCULAR: per hpi RESPIRATORY: No shortness of breath, cough or sputum.  GASTROINTESTINAL: No anorexia, nausea, vomiting or diarrhea. No abdominal pain or blood.  GENITOURINARY: No burning on urination, no polyuria NEUROLOGICAL:  No headache, dizziness, syncope, paralysis, ataxia, numbness or tingling in the extremities. No change in bowel or bladder control.  MUSCULOSKELETAL: No muscle, back pain, joint pain or stiffness.  LYMPHATICS: No enlarged nodes. No history of splenectomy.  PSYCHIATRIC: No history of depression or anxiety.  ENDOCRINOLOGIC: No reports of sweating, cold or heat intolerance. No polyuria or polydipsia.  Marland Kitchen   Physical Examination Vitals:   11/20/16 1506  BP: (!) 160/73  Pulse: 68   Vitals:   11/20/16 1506  Weight: 253 lb (114.8 kg)  Height: 5\' 9"  (1.753 m)    Gen: resting comfortably, no acute distress HEENT: no scleral icterus, pupils equal round and reactive, no palptable cervical adenopathy,  CV: RRR, no m/r/g, no jvd. Bilateral bruits Resp: Clear to auscultation bilaterally GI: abdomen is soft, non-tender, non-distended, normal bowel sounds, no hepatosplenomegaly MSK: extremities are warm, no edema.  Skin: warm,  no rash Neuro:  no focal deficits Psych: appropriate affect   Diagnostic Studies 09/2012 stress echo Study Conclusions  - Stress ECG conclusions: Abnormal ST changes noted - 1 to 2 mm depression in leads II, III, aVF, V4-V6 at peak heart rate, 1 mm in recovery with resolution by 7 minutes. No chest pain with ST changes. No arrhythmias. The stress ECG was consistent with myocardial ischemia. Duke scoring: exercise time of 61min; maximum ST deviation of 29mm; no angina; resulting score is -3. This score predicts a moderate risk of cardiac events. - Staged echo: There was no echocardiographic evidence for stress-induced ischemia. This would suggest the possibility of "false positive" ECG findings, perhaps repolarization abnormalities.  09/2015 echo Study Conclusions  - Left ventricle: The cavity size was normal. Wall thickness was  normal. Systolic function was normal. The estimated ejection  fraction was in the range of 60% to 65%. Doppler  parameters are  consistent with abnormal left ventricular relaxation (grade 1  diastolic dysfunction). - Aortic valve: Valve area (VTI): 3.8 cm^2. Valve area (Vmax): 3.69  cm^2. Valve area (Vmean): 3.61 cm^2. - Aorta: The visualized portion of the aortic arch is midly dilated  at 3.6 cm. - Mitral valve: There was mild regurgitation. - Left atrium: The atrium was moderately dilated. 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 - Technically difficult study.    Assessment and Plan  1. Bradycardia - chronic mild sinus bradycardia. Overall asymptomatic. Recent dizziness after recent bp med increase, follow at this time.   2. HTN - elevated in clinic. With recent med increase by pcp and recent dizziness no further titration today.  3. Chronic diastolic HF - continue lasix  4. Chest pain - EKG in clinic SR, no ischemic changes - order lexiscan MPI to furhter evaluate  5. Carotid bruits - order carotid US  6. Hyperlipidemia - start atorvastatin 40mg  daily  F/u 6 weeks       Arnoldo Lenis, M.D., F.A.C.C.

## 2016-11-20 NOTE — Patient Instructions (Signed)
Medication Instructions:   Begin Atorvastatin 40mg  daily.  Continue all other medications.    Labwork: none  Testing/Procedures:  Your physician has requested that you have a carotid duplex. This test is an ultrasound of the carotid arteries in your neck. It looks at blood flow through these arteries that supply the brain with blood. Allow one hour for this exam. There are no restrictions or special instructions.  Your physician has requested that you have an exercise stress myoview. For further information please visit [email or web address]. Please follow instruction sheet, as given.  Office will contact with results via phone or letter.    Follow-Up: 6 weeks   Any Other Special Instructions Will Be Listed Below (If Applicable).  If you need a refill on your cardiac medications before your next appointment, please call your pharmacy.

## 2016-12-03 ENCOUNTER — Inpatient Hospital Stay (HOSPITAL_COMMUNITY): Admission: RE | Admit: 2016-12-03 | Payer: No Typology Code available for payment source | Source: Ambulatory Visit

## 2016-12-03 ENCOUNTER — Encounter (HOSPITAL_COMMUNITY): Admission: RE | Admit: 2016-12-03 | Payer: Medicare Other | Source: Ambulatory Visit

## 2016-12-10 ENCOUNTER — Encounter (HOSPITAL_COMMUNITY)
Admission: RE | Admit: 2016-12-10 | Discharge: 2016-12-10 | Disposition: A | Discharge status: Home / Self Care | Payer: Medicare Other | Source: Ambulatory Visit | Attending: Cardiology | Admitting: Cardiology

## 2016-12-10 ENCOUNTER — Encounter (HOSPITAL_COMMUNITY): Payer: Self-pay

## 2016-12-10 ENCOUNTER — Encounter (HOSPITAL_BASED_OUTPATIENT_CLINIC_OR_DEPARTMENT_OTHER)
Admission: RE | Admit: 2016-12-10 | Discharge: 2016-12-10 | Disposition: A | Discharge status: Home / Self Care | Payer: Medicare Other | Source: Ambulatory Visit | Attending: Cardiology | Admitting: Cardiology

## 2016-12-10 DIAGNOSIS — R079 Chest pain, unspecified: Secondary | ICD-10-CM

## 2016-12-10 LAB — NM MYOCAR MULTI W/SPECT W/WALL MOTION / EF
Estimated workload: 7 METS
Exercise duration (min): 4 min
Exercise duration (sec): 39 s
LV dias vol: 94 mL (ref 62–150)
LV sys vol: 35 mL
MPHR: 145 {beats}/min
Peak HR: 137 {beats}/min
Percent HR: 94 %
RATE: 0.32
RPE: 13
Rest HR: 50 {beats}/min
SDS: 0
SRS: 0
SSS: 0
TID: 0.89

## 2016-12-10 MED ORDER — REGADENOSON 0.4 MG/5ML IV SOLN
INTRAVENOUS | Status: AC
  Filled 2016-12-10: qty 5

## 2016-12-10 MED ORDER — TECHNETIUM TC 99M TETROFOSMIN IV KIT
30.0000 | PACK | Freq: Once | INTRAVENOUS | Status: AC | PRN
  Administered 2016-12-10: 30 via INTRAVENOUS

## 2016-12-10 MED ORDER — SODIUM CHLORIDE 0.9% FLUSH
INTRAVENOUS | Status: AC
  Administered 2016-12-10: 10 mL via INTRAVENOUS
  Filled 2016-12-10: qty 10

## 2016-12-10 MED ORDER — TECHNETIUM TC 99M TETROFOSMIN IV KIT
10.0000 | PACK | Freq: Once | INTRAVENOUS | Status: AC | PRN
  Administered 2016-12-10: 10 via INTRAVENOUS

## 2016-12-15 ENCOUNTER — Telehealth: Payer: Self-pay | Admitting: *Deleted

## 2016-12-15 NOTE — Telephone Encounter (Signed)
-----   Message from Arnoldo Lenis, MD sent at 12/14/2016 10:16 AM EDT ----- Stress test looks good, no evidence of any significant blockages. Will discuss further at our f/u  Zandra Abts MD

## 2016-12-15 NOTE — Telephone Encounter (Signed)
Pt aware - routed to pcp  

## 2016-12-17 ENCOUNTER — Ambulatory Visit: Payer: Medicare Other

## 2016-12-17 DIAGNOSIS — R0989 Other specified symptoms and signs involving the circulatory and respiratory systems: Secondary | ICD-10-CM | POA: Diagnosis not present

## 2016-12-17 DIAGNOSIS — I6529 Occlusion and stenosis of unspecified carotid artery: Secondary | ICD-10-CM

## 2016-12-23 ENCOUNTER — Telehealth: Payer: Self-pay | Admitting: *Deleted

## 2016-12-23 NOTE — Telephone Encounter (Signed)
Patient informed. 

## 2016-12-23 NOTE — Telephone Encounter (Signed)
-----   Message from Arnoldo Lenis, MD sent at 12/23/2016  2:50 PM EDT ----- Carotid US is normal, no blockages    Zandra Abts MD

## 2017-01-21 ENCOUNTER — Encounter: Payer: Self-pay | Admitting: Cardiology

## 2017-01-21 ENCOUNTER — Ambulatory Visit (INDEPENDENT_AMBULATORY_CARE_PROVIDER_SITE_OTHER): Payer: Medicare Other | Admitting: Cardiology

## 2017-01-21 VITALS — BP 142/78 | HR 63 | Ht 69.0 in | Wt 246.6 lb

## 2017-01-21 DIAGNOSIS — I5032 Chronic diastolic (congestive) heart failure: Secondary | ICD-10-CM | POA: Diagnosis not present

## 2017-01-21 DIAGNOSIS — R001 Bradycardia, unspecified: Secondary | ICD-10-CM | POA: Diagnosis not present

## 2017-01-21 DIAGNOSIS — I10 Essential (primary) hypertension: Secondary | ICD-10-CM

## 2017-01-21 DIAGNOSIS — E039 Hypothyroidism, unspecified: Secondary | ICD-10-CM | POA: Diagnosis not present

## 2017-01-21 NOTE — Patient Instructions (Signed)
Your physician recommends that you schedule a follow-up appointment in: [place] has recommended you make the following change in your medication:   START OTC ZANTAC 150 MG TWICE DAILY  You have been referred to DR [facility]   Thank you for choosing [facility]!!

## 2017-01-21 NOTE — Progress Notes (Signed)
Clinical Summary [name] is a 76 y.o.male  seen today for follow up of the following medical problems.   1. Bradycardia - no recent symptoms.    2. HTN -  compliant with meds  3. Chronic diastolic HF - echo 07/6107 since last visit showed LVEF 60-45%, grade I diastolic dysfunction.  - recent xray with some evidence of pulmonary edema, as well as LE edema. Improved since starting lasix  - occasiional LE edema. No significnat SOB/DOE.     4. Hyperlipidemia - 09/2016 TC 220 HDL 33 TG 165 LDL 157   5. Chest pain - started about 6-8 months. Sharp pain left chest, 7-8/10. Lasts 10-15 seconds. No other associated symptoms. Occurs 2-3 times a week - mainly occurs with activity. Can have DOE with high levels of activity.  - last visit we ordered an exercise nuclear stress test.  - 12/2016 nuclear stress: no ischemia, exercised 5 minutes.  - still with some pains at times.   6. AAA screen' - male over 22 with smoking - 10/2015 ABd [place]: no AAA  7. Carotid bruit - 12/2016 carotid US without significant disease  Past Medical History:  Diagnosis Date  . Arthritis    "fingers; knees; toes" (01/05/2014)  . Barrett's esophagus   . BPH (benign prostatic hyperplasia)   . Bradycardia 2009   Sinus rhythm in the 40s without symptoms;  Marland Kitchen GERD (gastroesophageal reflux disease)    ulcerative/erosive  . Gout   . Hemorrhoids   . Hiatal hernia   . History of skin cancer   . Hx of adenomatous colonic polyps   . Hyperlipidemia   . Hypertension   . Hypothyroidism    Following partial thyroidectomy  . Squamous carcinoma    skin cancer benign arms and right ear and rightside lip  . Tobacco abuse, in remission    10-20 pack years; discontinued in 1981     No Known Allergies   Current Outpatient Prescriptions  Medication Sig Dispense Refill  . allopurinol (ZYLOPRIM) 100 MG tablet Take 1 tablet by mouth daily as needed.    Marland Kitchen amLODipine (NORVASC) 10 MG tablet Take 10 mg by  mouth every morning.     Marland Kitchen atorvastatin (LIPITOR) 40 MG tablet Take 1 tablet (40 mg total) by mouth daily. 30 tablet 6  . hydrALAZINE (APRESOLINE) 25 MG tablet Take 1 tablet by mouth 2 (two) times daily.    Marland Kitchen levothyroxine (SYNTHROID, LEVOTHROID) 150 MCG tablet Take 150 mcg by mouth daily before breakfast.    . lisinopril (PRINIVIL,ZESTRIL) 40 MG tablet Take 1 tablet by mouth daily.    . tamsulosin (FLOMAX) 0.4 MG CAPS capsule Take 1 capsule by mouth daily.     No current facility-administered medications for this visit.      Past Surgical History:  Procedure Laterality Date  . ABDOMINAL HERNIA REPAIR  1990's  . CATARACT EXTRACTION W/ INTRAOCULAR LENS  IMPLANT, BILATERAL Bilateral 2012-2014  . COLONOSCOPY  12/2005   normal  . COLONOSCOPY  2004   tubulovillous adenoma of rectum   . COLONOSCOPY  01/12/2011   Dr. Gala Romney- normal rectum  . COLONOSCOPY N/A 07/09/2015   Procedure: COLONOSCOPY;  Surgeon: Daneil Dolin, MD;  [facility];  Service: Endoscopy;  Laterality: N/A;  730   . ELBOW ARTHROSCOPY Left 2014  . ESOPHAGEAL DILATION N/A 02/13/2015   Procedure: ESOPHAGEAL DILATION;  Surgeon: Daneil Dolin, MD;  [facility];  Service: Endoscopy;  Laterality: N/A;  .  ESOPHAGOGASTRODUODENOSCOPY  01/2006   geographic erosive RE, noncritical peptic stricture s/p dilation, antral erosion  . ESOPHAGOGASTRODUODENOSCOPY  01/12/2011   Dr. Gala Romney- hiatal hernia, distal esophageal erosions, barretts esophagus  . ESOPHAGOGASTRODUODENOSCOPY  01/27/2012   Dr.Rourk- short segment barretts esophagus, erosive reflux esophagitis. bx= barretts esophagus, negative for dysplasia  . ESOPHAGOGASTRODUODENOSCOPY N/A 02/13/2015   Dr.Rourk- ulcerative/erosive reflux esophagitis, abnormal esophagitis c/w prior diagnosis of barretts esophagus, hiatal hernia. bx=barretts esophagus  . HEMORRHOID SURGERY N/A 07/12/2015   Procedure: SIMPLE HEMORRHOIDECTOMY;  Surgeon: Aviva Signs, MD;  Location: [facility];   Service: General;  Laterality: N/A;  . [place]  01/12/2011   Procedure: [name] DILATION;  Surgeon: Daneil Dolin, MD;  [facility];  Service: Endoscopy;  Laterality: N/A;  56 french  . SQUAMOUS CELL CARCINOMA EXCISION     "cut off my hands; left arm; under right lip; right ear" (01/05/2014)  . THYROIDECTOMY, PARTIAL Right 1969   lobe-nodule present  . TOTAL KNEE ARTHROPLASTY Left 01/05/2014   Procedure: LEFT TOTAL KNEE ARTHROPLASTY;  Surgeon: Mcarthur Rossetti, MD;  Location: [place];  Service: Orthopedics;  Laterality: Left;  . TOTAL KNEE ARTHROPLASTY Right 05/04/2014   Procedure: RIGHT TOTAL KNEE ARTHROPLASTY;  Surgeon: Mcarthur Rossetti, MD;  [facility]: [facility];  Service: Orthopedics;  Laterality: Right;  . UMBILICAL HERNIA REPAIR  2000     No Known Allergies    Family History  Problem Relation Age of Onset  . Lung cancer Father 58  . Colon polyps Brother   . Colon cancer Neg Hx   . Liver disease Neg Hx      Social History [name] reports that he quit smoking about 36 years ago. His smoking use included Cigarettes. He started smoking about 60 years ago. He has a 20.00 pack-year smoking history. He has never used smokeless tobacco. [name] reports that he drinks about 0.6 oz of alcohol per week .   Review of Systems CONSTITUTIONAL: No weight loss, fever, chills, weakness or fatigue.  HEENT: Eyes: No visual loss, blurred vision, double vision or yellow sclerae.No hearing loss, sneezing, congestion, runny nose or sore throat.  SKIN: No rash or itching.  CARDIOVASCULAR: per hpi RESPIRATORY: No shortness of breath, cough or sputum.  GASTROINTESTINAL: No anorexia, nausea, vomiting or diarrhea. No abdominal pain or blood.  GENITOURINARY: No burning on urination, no polyuria NEUROLOGICAL: No headache, dizziness, syncope, paralysis, ataxia, numbness or tingling in the extremities. No change in bowel or bladder control.    MUSCULOSKELETAL: No muscle, back pain, joint pain or stiffness.  LYMPHATICS: No enlarged nodes. No history of splenectomy.  PSYCHIATRIC: No history of depression or anxiety.  ENDOCRINOLOGIC: No reports of sweating, cold or heat intolerance. No polyuria or polydipsia.  Marland Kitchen   Physical Examination Vitals:   01/21/17 1300  BP: (!) 142/78  Pulse: 63   Vitals:   01/21/17 1300  Weight: 246 lb 9.6 oz (111.9 kg)  Height: 5\' 9"  (1.753 m)    Gen: resting comfortably, no acute distress HEENT: no scleral icterus, pupils equal round and reactive, no palptable cervical adenopathy,  CV: RRR, no m/r/g no jvd Resp: Clear to auscultation bilaterally GI: abdomen is soft, non-tender, non-distended, normal bowel sounds, no hepatosplenomegaly MSK: extremities are warm, no edema.  Skin: warm, no rash Neuro:  no focal deficits Psych: appropriate affect   Diagnostic Studies  09/2012 stress echo Study Conclusions  - Stress ECG conclusions: Abnormal ST changes noted - 1  to 2 mm depression in leads II, III, aVF, V4-V6 at peak heart rate, 1 mm in recovery with resolution by 7 minutes. No chest pain with ST changes. No arrhythmias. The stress ECG was consistent with myocardial ischemia. [facility] scoring: exercise time of 3min; maximum ST deviation of 51mm; no angina; resulting score is -3. This score predicts a moderate risk of cardiac events. - Staged echo: There was no echocardiographic evidence for stress-induced ischemia. This would suggest the possibility of "false positive" ECG findings, perhaps repolarization abnormalities.  09/2015 echo Study Conclusions  - Left ventricle: The cavity size was normal. Wall thickness was  normal. Systolic function was normal. The estimated ejection  fraction was in the range of 60% to 65%. Doppler parameters are  consistent with abnormal left ventricular relaxation (grade 1  diastolic dysfunction). - Aortic valve: Valve area (VTI):  3.8 cm^2. Valve area (Vmax): 3.69  cm^2. Valve area (Vmean): 3.61 cm^2. - Aorta: The visualized portion of the aortic arch is midly dilated  at 3.6 cm. - Mitral valve: There was mild regurgitation. - Left atrium: The atrium was moderately dilated. 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 - Technically difficult study.  12/2016 nuclear stress  Blood pressure demonstrated a hypertensive response to exercise.  Downsloping ST segment depression ST segment depression of 0.5 mm was noted during stress in the II, III, aVF and V6 leads, and returning to baseline after less than 1 minute of recovery.  T wave inversion was noted during stress in the II, III, aVF and V6 leads.  Nuclear myocardial perfusion was normal. No ischemia or scar.  This is a low risk study.  Nuclear stress EF: 63%.  Assessment and Plan  1. Bradycardia - chronic mild sinus bradycardia. No recent symptoms  -continue to monitor  2. HTN - at goal, continue current meds  3. Chronic diastolic HF - no symptoms, continue diuretics.   4. Chest pain - recent negative stress test - trial of antacid. No further cardiac testing at this time .  5. Hyperlipidemia - continue staitn  6. Hypothyroidism -= refer to Dr Dorris Fetch   [name], M.D

## 2017-02-23 ENCOUNTER — Encounter: Payer: Self-pay | Admitting: "Endocrinology

## 2017-02-23 ENCOUNTER — Ambulatory Visit (INDEPENDENT_AMBULATORY_CARE_PROVIDER_SITE_OTHER): Payer: Medicare Other | Admitting: "Endocrinology

## 2017-02-23 VITALS — BP 165/86 | HR 52 | Ht 69.0 in | Wt 250.0 lb

## 2017-02-23 DIAGNOSIS — E890 Postprocedural hypothyroidism: Secondary | ICD-10-CM | POA: Diagnosis not present

## 2017-02-23 DIAGNOSIS — I6529 Occlusion and stenosis of unspecified carotid artery: Secondary | ICD-10-CM | POA: Diagnosis not present

## 2017-02-23 LAB — T3, FREE: T3, Free: 2.7 pg/mL (ref 2.3–4.2)

## 2017-02-23 LAB — T4, FREE: Free T4: 1.3 ng/dL (ref 0.8–1.8)

## 2017-02-23 LAB — TSH: TSH: 6.36 mIU/L — ABNORMAL HIGH (ref 0.40–4.50)

## 2017-02-23 NOTE — Progress Notes (Signed)
Subjective:    Patient ID: [name], male    DOB: [birth year], PCP Michell Heinrich, DO   Past Medical History:  Diagnosis Date  . Arthritis    "fingers; knees; toes" (01/05/2014)  . Barrett's esophagus   . BPH (benign prostatic hyperplasia)   . Bradycardia 2009   Sinus rhythm in the 40s without symptoms;  Marland Kitchen GERD (gastroesophageal reflux disease)    ulcerative/erosive  . Gout   . Hemorrhoids   . Hiatal hernia   . History of skin cancer   . Hx of adenomatous colonic polyps   . Hyperlipidemia   . Hypertension   . Hypothyroidism    Following partial thyroidectomy  . Squamous carcinoma    skin cancer benign arms and right ear and rightside lip  . Tobacco abuse, in remission    10-20 pack years; discontinued in 1981   Past Surgical History:  Procedure Laterality Date  . ABDOMINAL HERNIA REPAIR  1990's  . CATARACT EXTRACTION W/ INTRAOCULAR LENS  IMPLANT, BILATERAL Bilateral 2012-2014  . COLONOSCOPY  12/2005   normal  . COLONOSCOPY  2004   tubulovillous adenoma of rectum   . COLONOSCOPY  01/12/2011   Dr. Gala Romney- normal rectum  . COLONOSCOPY N/A 07/09/2015   Procedure: COLONOSCOPY;  Surgeon: Daneil Dolin, MD;  [facility];  Service: Endoscopy;  Laterality: N/A;  730   . ELBOW ARTHROSCOPY Left 2014  . ESOPHAGEAL DILATION N/A 02/13/2015   Procedure: ESOPHAGEAL DILATION;  Surgeon: Daneil Dolin, MD;  [facility];  Service: Endoscopy;  Laterality: N/A;  . ESOPHAGOGASTRODUODENOSCOPY  01/2006   geographic erosive RE, noncritical peptic stricture s/p dilation, antral erosion  . ESOPHAGOGASTRODUODENOSCOPY  01/12/2011   Dr. Gala Romney- hiatal hernia, distal esophageal erosions, barretts esophagus  . ESOPHAGOGASTRODUODENOSCOPY  01/27/2012   Dr.Rourk- short segment barretts esophagus, erosive reflux esophagitis. bx= barretts esophagus, negative for dysplasia  . ESOPHAGOGASTRODUODENOSCOPY N/A 02/13/2015   Dr.Rourk- ulcerative/erosive reflux esophagitis, abnormal  esophagitis c/w prior diagnosis of barretts esophagus, hiatal hernia. bx=barretts esophagus  . HEMORRHOID SURGERY N/A 07/12/2015   Procedure: SIMPLE HEMORRHOIDECTOMY;  Surgeon: Aviva Signs, MD;  [facility];  Service: General;  Laterality: N/A;  . [place]  01/12/2011   Procedure: [name] DILATION;  Surgeon: Daneil Dolin, MD;  [facility];  Service: Endoscopy;  Laterality: N/A;  56 french  . SQUAMOUS CELL CARCINOMA EXCISION     "cut off my hands; left arm; under right lip; right ear" (01/05/2014)  . THYROIDECTOMY, PARTIAL Right 1969   lobe-nodule present  . TOTAL KNEE ARTHROPLASTY Left 01/05/2014   Procedure: LEFT TOTAL KNEE ARTHROPLASTY;  Surgeon: Mcarthur Rossetti, MD;  Location: [place];  Service: Orthopedics;  Laterality: Left;  . TOTAL KNEE ARTHROPLASTY Right 05/04/2014   Procedure: RIGHT TOTAL KNEE ARTHROPLASTY;  Surgeon: Mcarthur Rossetti, MD;  [facility];  Service: Orthopedics;  Laterality: Right;  . UMBILICAL HERNIA REPAIR  2000   Social History   Social History  . Marital status: Married    Spouse name: N/A  . Number of children: 2  . Years of education: N/A   Occupational History  . [employer]     Current employment  . [employer]     Prior employment   Social History Main Topics  . Smoking status: Former Smoker    Packs/day: 1.00    Years: 20.00    Types: Cigarettes    Start date:  07/06/1956    Quit date: 07/06/1980  . Smokeless tobacco: Never Used     Comment: "quit smoking in ~ 1982"  . Alcohol use 0.6 oz/week    1 Cans of beer per week     Comment: rare  . Drug use: No  . Sexual activity: Not Asked   Other Topics Concern  . None   Social History Narrative  . None   Outpatient Encounter Prescriptions as of 02/23/2017  Medication Sig  . amLODipine (NORVASC) 10 MG tablet Take 10 mg by mouth every morning.   Marland Kitchen atorvastatin (LIPITOR) 40 MG tablet Take 40 mg by mouth daily.  . Colchicine 0.6  MG CAPS Take by mouth 2 (two) times daily.  Marland Kitchen levothyroxine (SYNTHROID, LEVOTHROID) 150 MCG tablet Take 150 mcg by mouth daily before breakfast.  . lisinopril (PRINIVIL,ZESTRIL) 40 MG tablet Take 1 tablet by mouth daily.  . tamsulosin (FLOMAX) 0.4 MG CAPS capsule Take 1 capsule by mouth daily.  Marland Kitchen atorvastatin (LIPITOR) 40 MG tablet Take 1 tablet (40 mg total) by mouth daily.  . [DISCONTINUED] allopurinol (ZYLOPRIM) 100 MG tablet Take 1 tablet by mouth daily as needed.  . [DISCONTINUED] hydrALAZINE (APRESOLINE) 25 MG tablet Take 1 tablet by mouth 2 (two) times daily.  . [DISCONTINUED] ranitidine (ZANTAC) 150 MG tablet Take 150 mg by mouth 2 (two) times daily.   No facility-administered encounter medications on file as of 02/23/2017.    ALLERGIES: No Known Allergies  VACCINATION STATUS:  There is no immunization history on file for this patient.  HPI 76 year old male patient with medical history as above. He is being seen in consultation for postsurgical hypothyroidism requested by Dr. Marveen Reeks. He underwent what appears to be partial thyroidectomy for benign pathology in 1969. He was treated with various doses of levothyroxine over the years, currently on levothyroxine 150 g by mouth every morning. He reports compliance to this medication. He  takes it orally in the morning with water. - Reportedly, he did have trouble regulating his thyroid function tests over the last few years. Most recent labs are from March 2018 where TSH was 5.39, free T3 was 3.3 and total T4 was 11.3. - He denies family history of thyroid dysfunction. He denies family history of thyroid cancer.  - He denies exposure to neck radiation. - He denies dysphagia, shortness of breath, no recent voice change. - He denies heat nor cold intolerance. He has steady weight over the last couple of years, but was unable to lose weight.  Review of Systems  Constitutional: + Overweight/obese most of his adult life, + fatigue, no  subjective hyperthermia, no subjective hypothermia Eyes: no blurry vision, no xerophthalmia ENT: no sore throat, no nodules palpated in throat, no dysphagia/odynophagia, no hoarseness Cardiovascular: no Chest Pain, no Shortness of Breath, no palpitations, no leg swelling Respiratory: no cough, no SOB Gastrointestinal: no Nausea/Vomiting/Diarhhea Musculoskeletal: no muscle/joint aches Skin: no rashes Neurological: no tremors, no numbness, no tingling, no dizziness Psychiatric: no depression, no anxiety  Objective:    BP (!) 165/86   Pulse (!) 52   Ht 5\' 9"  (1.753 m)   Wt 250 lb (113.4 kg)   BMI 36.92 kg/m   Wt Readings from Last 3 Encounters:  02/23/17 250 lb (113.4 kg)  01/21/17 246 lb 9.6 oz (111.9 kg)  11/20/16 253 lb (114.8 kg)    Physical Exam  Constitutional: + obese,  not in acute distress, normal state of mind Eyes: PERRLA, EOMI, no exophthalmos ENT: moist mucous membranes, +  post thyroidectomy  Scar on anterior lower neck, no cervical lymphadenopathy Cardiovascular: normal precordial activity, Regular Rate and Rhythm, no Murmur/Rubs/Gallops Respiratory:  adequate breathing efforts, no gross chest deformity, Clear to auscultation bilaterally Gastrointestinal: abdomen soft, Non -tender, No distension, Bowel Sounds present Musculoskeletal: no gross deformities, strength intact in all four extremities Skin: moist, warm, no rashes Neurological: no tremor with outstretched hands, Deep tendon reflexes normal in all four extremities.  CMP ( most recent) CMP     Component Value Date/Time   NA 141 07/10/2015 1100   K 4.1 07/10/2015 1100   CL 105 07/10/2015 1100   CO2 28 07/10/2015 1100   GLUCOSE 100 (H) 07/10/2015 1100   BUN 17 07/10/2015 1100   CREATININE 1.21 07/10/2015 1100   CREATININE 1.14 08/31/2012 1149   CALCIUM 9.8 07/10/2015 1100   PROT 6.8 08/31/2012 1149   ALBUMIN 4.5 08/31/2012 1149   AST 19 08/31/2012 1149   ALT 22 08/31/2012 1149   ALKPHOS 55  08/31/2012 1149   BILITOT 0.6 08/31/2012 1149   GFRNONAA 57 (L) 07/10/2015 1100   GFRAA >60 07/10/2015 1100       Lipid Panel ( most recent) Lipid Panel     Component Value Date/Time   CHOL 224 (H) 08/31/2012 1149   TRIG 258 (H) 08/31/2012 1149   HDL 35 (L) 08/31/2012 1149   CHOLHDL 6.4 08/31/2012 1149   VLDL 52 (H) 08/31/2012 1149   LDLCALC 137 (H) 08/31/2012 1149     Labs from 09/28/2016 showed TSH 5.39 elevated, free T3 3 0.3, total T4 11.3.   Assessment & Plan:   1. Postsurgical hypothyroidism - This patient is being seen at the kind request of Dr.Addis. -I have reviewed his unavailable thyroid records and clinically evaluated this patient. - He does not postsurgical hypothyroidism. The details of his remote past thyroid surgery are not available to review. He reports that surgical outcome was benign. - He will need a new set of more complete thyroid function tests before adjusting his thyroid hormone dose. -I will proceed to obtain TSH, free T4, and free T3 along with the baseline thyroid/neck sonogram. - In the meantime, I have advised him to continue on levothyroxine 150 g by mouth every morning.  - We discussed about correct intake of levothyroxine, at fasting, with water, separated by at least 30 minutes from breakfast, and separated by more than 4 hours from calcium, iron, multivitamins, acid reflux medications (PPIs). -Patient is made aware of the fact that thyroid hormone replacement is needed for life, dose to be adjusted by periodic monitoring of thyroid function tests.  - I advised patient to maintain close follow up with Michell Heinrich, DO for primary care needs. Follow up plan: Return in about 2 weeks (around 03/09/2017) for Thyroid / Neck Ultrasound, labs today.  Glade Lloyd, MD Phone: [phone]  Fax: [phone]   This note was partially dictated with voice recognition software. Similar sounding words can be transcribed inadequately or may not  be  corrected upon review. 02/23/2017, 5:48 PM

## 2017-03-09 ENCOUNTER — Ambulatory Visit (HOSPITAL_COMMUNITY)
Admission: RE | Admit: 2017-03-09 | Discharge: 2017-03-09 | Disposition: A | Discharge status: Home / Self Care | Payer: Medicare Other | Source: Ambulatory Visit | Attending: "Endocrinology | Admitting: "Endocrinology

## 2017-03-09 DIAGNOSIS — E890 Postprocedural hypothyroidism: Secondary | ICD-10-CM | POA: Insufficient documentation

## 2017-03-09 IMAGING — US US SOFT TISSUE HEAD/NECK
1 series · 14 of 25 positions shown · non-contrast
Comparison: None.

CLINICAL DATA: Hypothyroid. Partial thyroidectomy, right-sided,
[[facility]].

EXAM:
THYROID ULTRASOUND
TECHNIQUE: Ultrasound examination of the thyroid gland and adjacent soft
tissues was performed.

[Series 1: us soft tissue head/neck · 0.07mm/px · 14 of 34 slices shown]
[im 1/34]
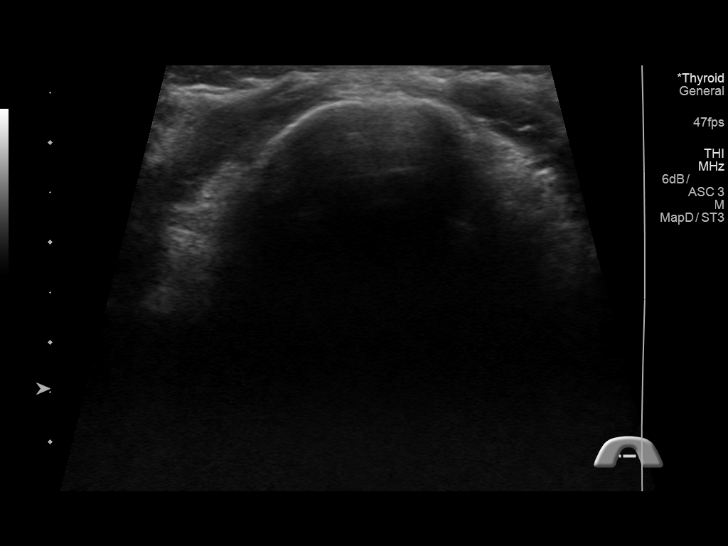
[im 3/34]
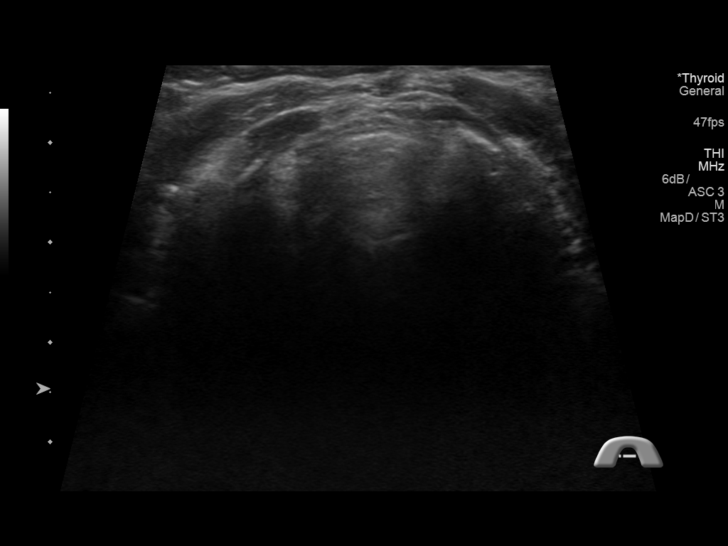
[im 6/34]
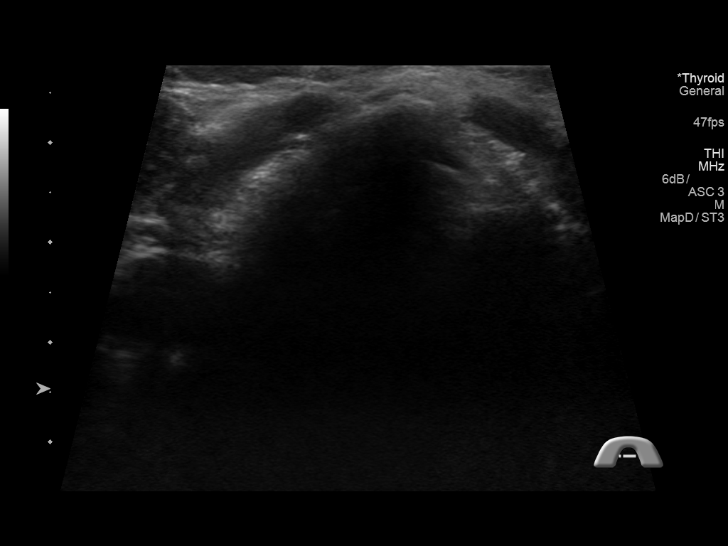
[im 9/34]
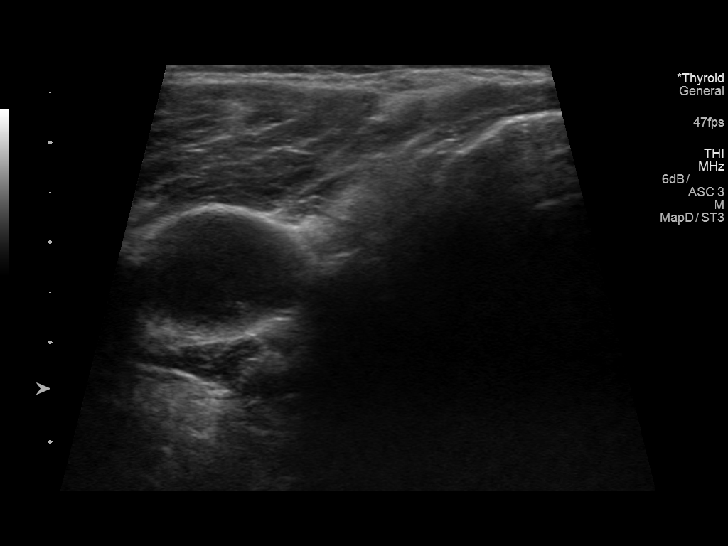
[im 12/34]
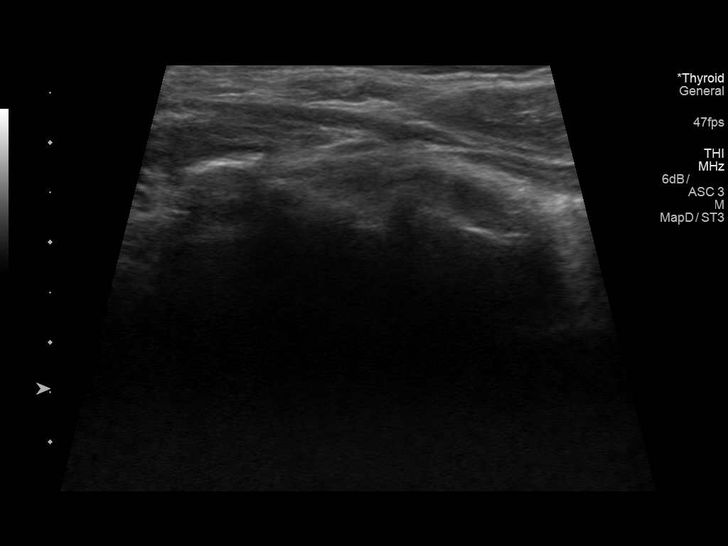
[im 13/34]
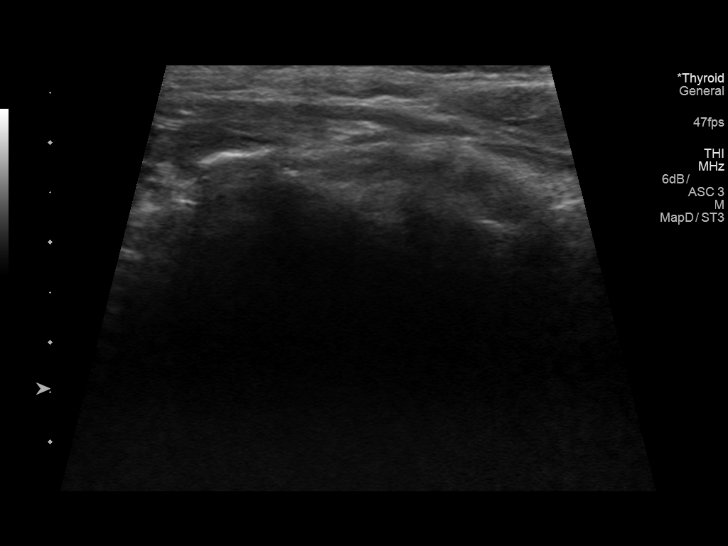
[im 16/34]
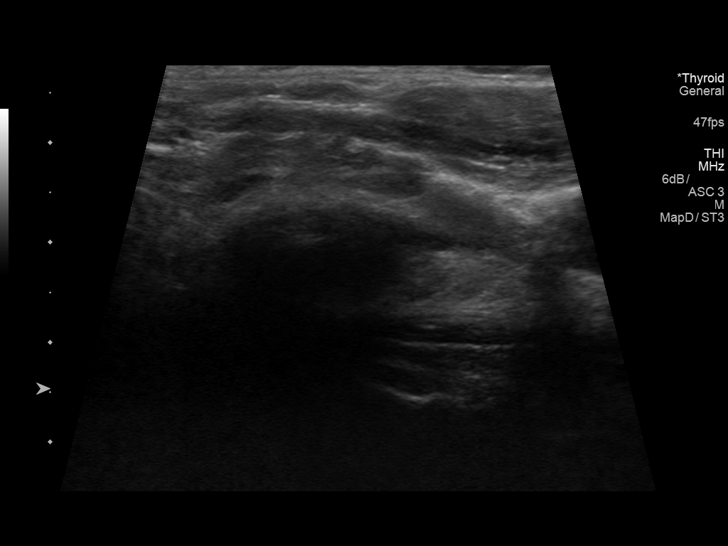
[im 18/34]
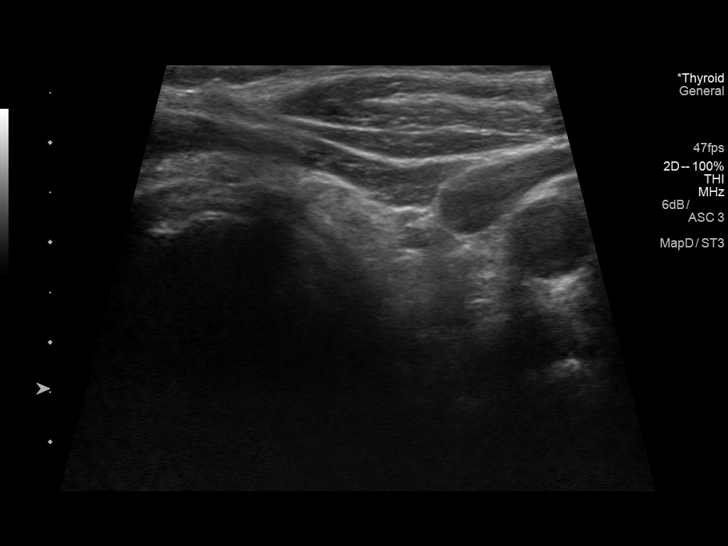
[im 21/34]
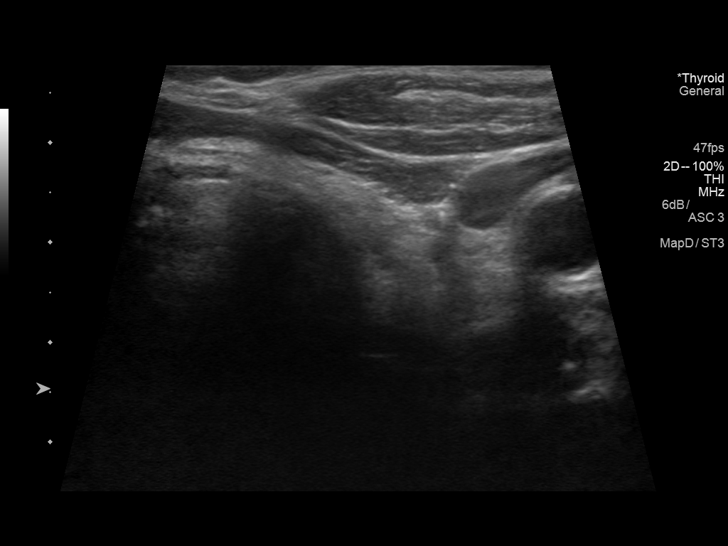
[im 23/34]
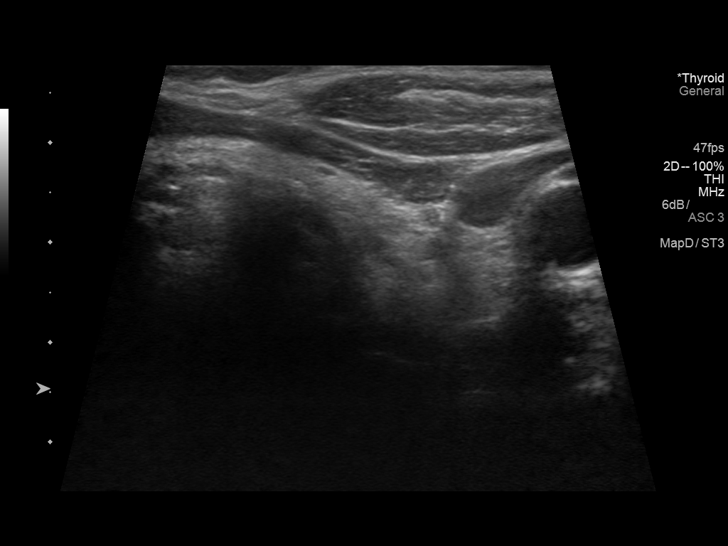
[im 25/34]
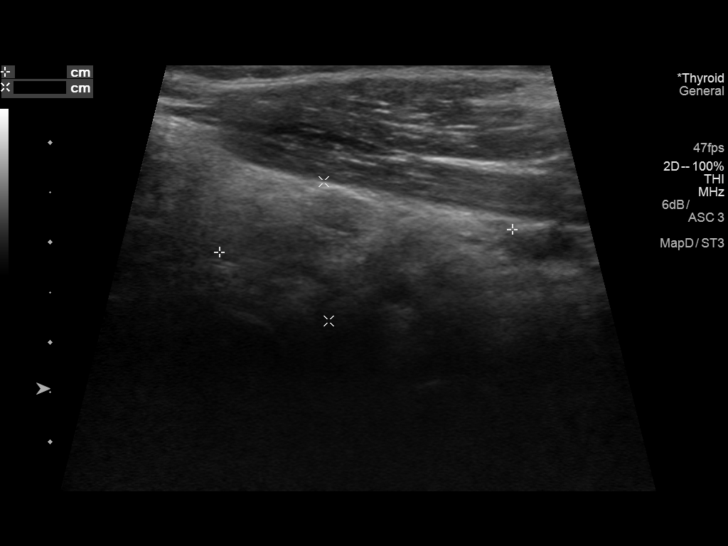
[im 28/34]
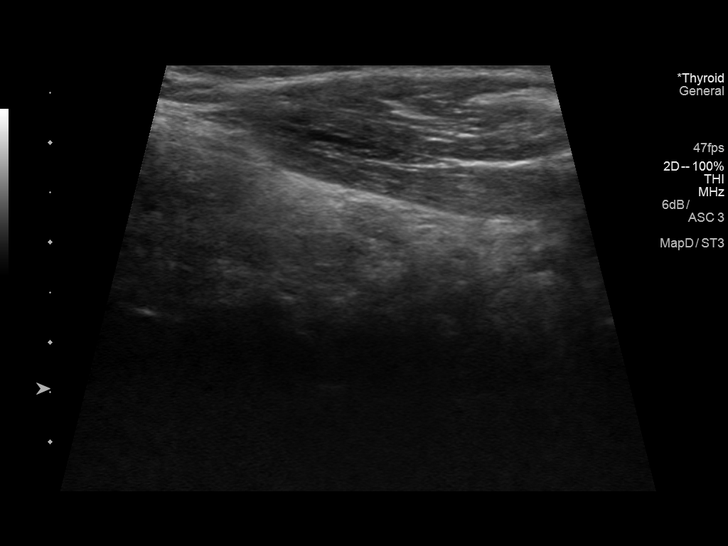
[im 31/34]
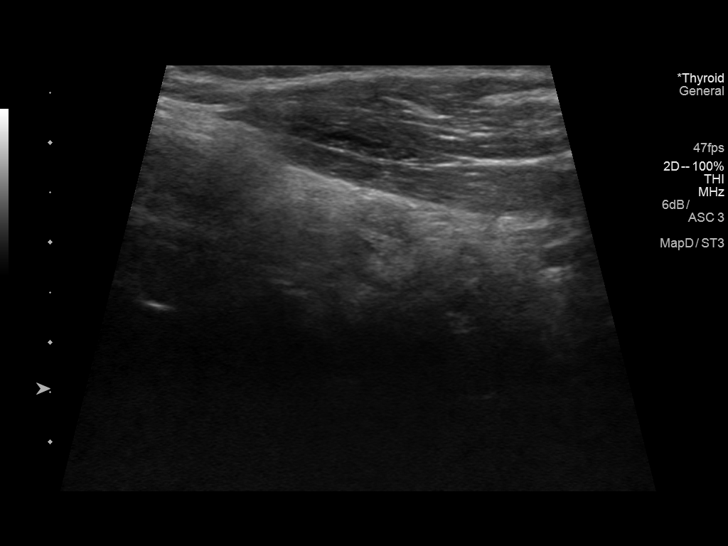
[im 34/34]
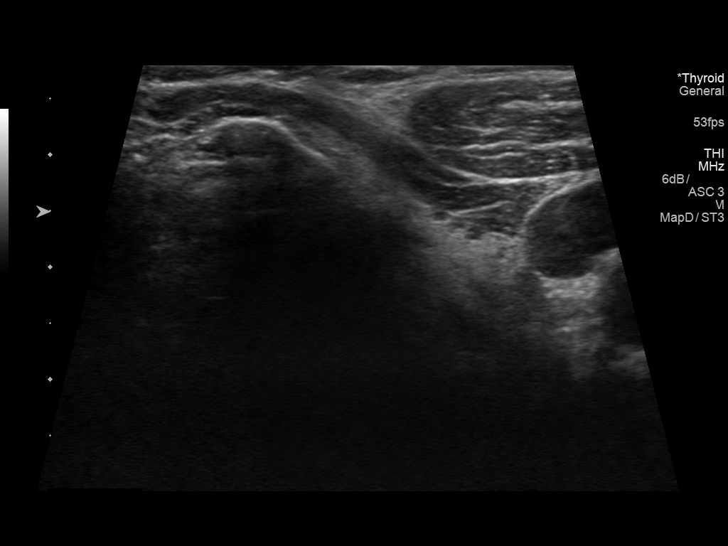

[14 of 25 positions shown; findings below may reference images not displayed]

FINDINGS: Parenchymal Echotexture: Markedly heterogenous

Isthmus: 0.2 cm

Right lobe: Absent tissue

Left lobe: 2.9 x 1.4 x 1.3 cm

_________________________________________________________

Estimated total number of nodules >/= 1 cm: 0

Number of spongiform nodules >/=  2 cm not described below (TR1): 0

Number of mixed cystic and solid nodules >/= 1.5 cm not described
below (TR2): 0

_________________________________________________________

No discrete nodules are seen within the thyroid gland.
IMPRESSION: There is no residual tissue in the right thyroid bed consistent with
partial thyroidectomy without evidence of recurrence.

The left lobe of the thyroid gland is diminutive with heterogeneous
tissue. This is presumed to represent residual thyroid tissue. No
definable nodule is identified.

The above is in keeping with the ACR TI-RADS recommendations - [HOSPITAL] [[facility]];[DATE].

## 2017-03-11 ENCOUNTER — Ambulatory Visit (INDEPENDENT_AMBULATORY_CARE_PROVIDER_SITE_OTHER): Payer: Medicare Other | Admitting: "Endocrinology

## 2017-03-11 ENCOUNTER — Encounter: Payer: Self-pay | Admitting: "Endocrinology

## 2017-03-11 VITALS — BP 118/69 | HR 59 | Ht 69.0 in | Wt 247.0 lb

## 2017-03-11 DIAGNOSIS — I6529 Occlusion and stenosis of unspecified carotid artery: Secondary | ICD-10-CM | POA: Diagnosis not present

## 2017-03-11 DIAGNOSIS — E890 Postprocedural hypothyroidism: Secondary | ICD-10-CM | POA: Diagnosis not present

## 2017-03-11 DIAGNOSIS — E782 Mixed hyperlipidemia: Secondary | ICD-10-CM | POA: Diagnosis not present

## 2017-03-11 DIAGNOSIS — Z6836 Body mass index (BMI) 36.0-36.9, adult: Secondary | ICD-10-CM

## 2017-03-11 DIAGNOSIS — I10 Essential (primary) hypertension: Secondary | ICD-10-CM | POA: Insufficient documentation

## 2017-03-11 MED ORDER — LEVOTHYROXINE SODIUM 175 MCG PO TABS: 175.0000 ug | ORAL_TABLET | Freq: Every day | ORAL | 6 refills | Status: DC

## 2017-03-11 NOTE — Progress Notes (Signed)
Subjective:    Patient ID: [name], male    DOB: [birth year], PCP Michell Heinrich, DO   Past Medical History:  Diagnosis Date  . Arthritis    "fingers; knees; toes" (01/05/2014)  . Barrett's esophagus   . BPH (benign prostatic hyperplasia)   . Bradycardia 2009   Sinus rhythm in the 40s without symptoms;  Marland Kitchen GERD (gastroesophageal reflux disease)    ulcerative/erosive  . Gout   . Hemorrhoids   . Hiatal hernia   . History of skin cancer   . Hx of adenomatous colonic polyps   . Hyperlipidemia   . Hypertension   . Hypothyroidism    Following partial thyroidectomy  . Squamous carcinoma    skin cancer benign arms and right ear and rightside lip  . Tobacco abuse, in remission    10-20 pack years; discontinued in 1981   Past Surgical History:  Procedure Laterality Date  . ABDOMINAL HERNIA REPAIR  1990's  . CATARACT EXTRACTION W/ INTRAOCULAR LENS  IMPLANT, BILATERAL Bilateral 2012-2014  . COLONOSCOPY  12/2005   normal  . COLONOSCOPY  2004   tubulovillous adenoma of rectum   . COLONOSCOPY  01/12/2011   Dr. Gala Romney- normal rectum  . COLONOSCOPY N/A 07/09/2015   Procedure: COLONOSCOPY;  Surgeon: Daneil Dolin, MD;  Location: [facility];  Service: Endoscopy;  Laterality: N/A;  730   . ELBOW ARTHROSCOPY Left 2014  . ESOPHAGEAL DILATION N/A 02/13/2015   Procedure: ESOPHAGEAL DILATION;  Surgeon: Daneil Dolin, MD;  Location: [facility];  Service: Endoscopy;  Laterality: N/A;  . ESOPHAGOGASTRODUODENOSCOPY  01/2006   geographic erosive RE, noncritical peptic stricture s/p dilation, antral erosion  . ESOPHAGOGASTRODUODENOSCOPY  01/12/2011   Dr. Gala Romney- hiatal hernia, distal esophageal erosions, barretts esophagus  . ESOPHAGOGASTRODUODENOSCOPY  01/27/2012   Dr.Rourk- short segment barretts esophagus, erosive reflux esophagitis. bx= barretts esophagus, negative for dysplasia  . ESOPHAGOGASTRODUODENOSCOPY N/A 02/13/2015   Dr.Rourk- ulcerative/erosive reflux esophagitis, abnormal  esophagitis c/w prior diagnosis of barretts esophagus, hiatal hernia. bx=barretts esophagus  . HEMORRHOID SURGERY N/A 07/12/2015   Procedure: SIMPLE HEMORRHOIDECTOMY;  Surgeon: Aviva Signs, MD;  Location: [facility];  Service: General;  Laterality: N/A;  . [place]  01/12/2011   Procedure: [name] DILATION;  Surgeon: Daneil Dolin, MD;  Location: [facility];  Service: Endoscopy;  Laterality: N/A;  56 french  . SQUAMOUS CELL CARCINOMA EXCISION     "cut off my hands; left arm; under right lip; right ear" (01/05/2014)  . THYROIDECTOMY, PARTIAL Right 1969   lobe-nodule present  . TOTAL KNEE ARTHROPLASTY Left 01/05/2014   Procedure: LEFT TOTAL KNEE ARTHROPLASTY;  Surgeon: Mcarthur Rossetti, MD;  Location: [facility];  Service: Orthopedics;  Laterality: Left;  . TOTAL KNEE ARTHROPLASTY Right 05/04/2014   Procedure: RIGHT TOTAL KNEE ARTHROPLASTY;  Surgeon: Mcarthur Rossetti, MD;  Location: [facility];  Service: Orthopedics;  Laterality: Right;  . UMBILICAL HERNIA REPAIR  2000   Social History   Social History  . Marital status: Married    Spouse name: N/A  . Number of children: 2  . Years of education: N/A   Occupational History  . [employer]     Current employment  . [employer]     Prior employment   Social History Main Topics  . Smoking status: Former Smoker    Packs/day: 1.00    Years: 20.00    Types: Cigarettes    Start date:  07/06/1956    Quit date: 07/06/1980  . Smokeless tobacco: Never Used     Comment: "quit smoking in ~ 1982"  . Alcohol use 0.6 oz/week    1 Cans of beer per week     Comment: rare  . Drug use: No  . Sexual activity: Not Asked   Other Topics Concern  . None   Social History Narrative  . None   Outpatient Encounter Prescriptions as of 03/11/2017  Medication Sig  . amLODipine (NORVASC) 10 MG tablet Take 10 mg by mouth every morning.   Marland Kitchen atorvastatin (LIPITOR) 40 MG tablet Take 1 tablet (40 mg total) by mouth daily.  Marland Kitchen  atorvastatin (LIPITOR) 40 MG tablet Take 40 mg by mouth daily.  . Colchicine 0.6 MG CAPS Take by mouth 2 (two) times daily.  Marland Kitchen levothyroxine (SYNTHROID, LEVOTHROID) 175 MCG tablet Take 1 tablet (175 mcg total) by mouth daily before breakfast.  . lisinopril (PRINIVIL,ZESTRIL) 40 MG tablet Take 1 tablet by mouth daily.  . tamsulosin (FLOMAX) 0.4 MG CAPS capsule Take 1 capsule by mouth daily.  . [DISCONTINUED] levothyroxine (SYNTHROID, LEVOTHROID) 150 MCG tablet Take 150 mcg by mouth daily before breakfast.   No facility-administered encounter medications on file as of 03/11/2017.    ALLERGIES: No Known Allergies  VACCINATION STATUS:  There is no immunization history on file for this patient.  HPI 76 year old male patient with medical history as above. He is being seen in f/u for postsurgical hypothyroidism.  He underwent what appears to be partial thyroidectomy for benign pathology in 1969. He was treated with various doses of levothyroxine over the years, currently on levothyroxine 150 g by mouth every morning. He reports compliance to this medication. He  takes it orally in the morning with water. - Reportedly, he did have trouble regulating his thyroid function tests over the last few years. Most recent labs are from March 2018 where TSH was 5.39, free T3 was 3.3 and total T4 was 11.3. - Most recent thyroid function tests from 02/23/2017 showed TSH higher at 6.36 him a free T4 at 1.3. - He denies family history of thyroid dysfunction. He denies family history of thyroid cancer.  - He denies exposure to neck radiation. - He denies dysphagia, shortness of breath, no recent voice change. - He denies heat nor cold intolerance. He has steady weight over the last couple of years, but was unable to lose weight.  Review of Systems  Constitutional: + Overweight/obese most of his adult life, + fatigue, no subjective hyperthermia, no subjective hypothermia Eyes: no blurry vision, no  xerophthalmia ENT: no sore throat, no nodules palpated in throat, no dysphagia/odynophagia, no hoarseness Cardiovascular: no Chest Pain, no Shortness of Breath, no palpitations, no leg swelling Respiratory: no cough, no SOB Gastrointestinal: no Nausea/Vomiting/Diarhhea Musculoskeletal: no muscle/joint aches Skin: no rashes Neurological: no tremors, no numbness, no tingling, no dizziness Psychiatric: no depression, no anxiety  Objective:    BP 118/69   Pulse (!) 59   Ht 5\' 9"  (1.753 m)   Wt 247 lb (112 kg)   BMI 36.48 kg/m   Wt Readings from Last 3 Encounters:  03/11/17 247 lb (112 kg)  02/23/17 250 lb (113.4 kg)  01/21/17 246 lb 9.6 oz (111.9 kg)    Physical Exam  Constitutional: + obese,  not in acute distress, normal state of mind Eyes: PERRLA, EOMI, no exophthalmos ENT: moist mucous membranes, + post thyroidectomy  Scar on anterior lower neck, no cervical lymphadenopathy Cardiovascular: normal precordial  activity, Regular Rate and Rhythm, no Murmur/Rubs/Gallops Respiratory:  adequate breathing efforts, no gross chest deformity, Clear to auscultation bilaterally Gastrointestinal: abdomen soft, Non -tender, No distension, Bowel Sounds present Musculoskeletal: no gross deformities, strength intact in all four extremities Skin: moist, warm, no rashes Neurological: no tremor with outstretched hands, Deep tendon reflexes normal in all four extremities.  CMP ( most recent) CMP     Component Value Date/Time   NA 141 07/10/2015 1100   K 4.1 07/10/2015 1100   CL 105 07/10/2015 1100   CO2 28 07/10/2015 1100   GLUCOSE 100 (H) 07/10/2015 1100   BUN 17 07/10/2015 1100   CREATININE 1.21 07/10/2015 1100   CREATININE 1.14 08/31/2012 1149   CALCIUM 9.8 07/10/2015 1100   PROT 6.8 08/31/2012 1149   ALBUMIN 4.5 08/31/2012 1149   AST 19 08/31/2012 1149   ALT 22 08/31/2012 1149   ALKPHOS 55 08/31/2012 1149   BILITOT 0.6 08/31/2012 1149   GFRNONAA 57 (L) 07/10/2015 1100   GFRAA >60  07/10/2015 1100       Lipid Panel ( most recent) Lipid Panel     Component Value Date/Time   CHOL 224 (H) 08/31/2012 1149   TRIG 258 (H) 08/31/2012 1149   HDL 35 (L) 08/31/2012 1149   CHOLHDL 6.4 08/31/2012 1149   VLDL 52 (H) 08/31/2012 1149   LDLCALC 137 (H) 08/31/2012 1149    Results for ERRIN, CHEWNING (MRN [id number]) as of 03/11/2017 13:41  Ref. Range 02/23/2017 09:41  TSH Latest Ref Range: 0.40 - 4.50 mIU/L 6.36 (H)  Triiodothyronine,Free,Serum Latest Ref Range: 2.3 - 4.2 pg/mL 2.7  T4,Free(Direct) Latest Ref Range: 0.8 - 1.8 ng/dL 1.3   Labs from 09/28/2016 showed TSH 5.39 elevated, free T3 3 0.3, total T4 11.3. 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  Assessment & Plan:   1. Postsurgical hypothyroidism -I have reviewed his repeat labs. He will benefit from slight increase in his levothyroxine. I will prescribe levothyroxine 175 mg by mouth every morning.  - We discussed about correct intake of levothyroxine, at fasting, with water, separated by at least 30 minutes from breakfast, and separated by more than 4 hours from calcium, iron, multivitamins, acid reflux medications (PPIs). -Patient is made aware of the fact that thyroid hormone replacement is needed for life, dose to be adjusted by periodic monitoring of thyroid function tests. 2. History of goiter status post partial thyroidectomy: - His repeat thyroid/neck ultrasound from 03/09/2017 is remarkable for surgical absent right lobe, 2.9 cm remnant left lobe with no discrete nodules or lesions. He will not need any intervention.  - I advised patient to maintain close follow up with Michell Heinrich, DO for primary care needs. Follow up plan: Return in about 6 months (around 09/08/2017) for follow up with pre-visit labs.  Glade Lloyd, MD Phone: [phone]  Fax: [phone]   This note was partially dictated with voice recognition software. Similar  sounding words can be transcribed inadequately or may not  be corrected upon review. 03/11/2017, 1:40 PM

## 2017-05-19 ENCOUNTER — Encounter: Payer: Self-pay | Admitting: Cardiology

## 2017-05-19 ENCOUNTER — Ambulatory Visit (INDEPENDENT_AMBULATORY_CARE_PROVIDER_SITE_OTHER): Payer: Medicare Other | Admitting: Cardiology

## 2017-05-19 VITALS — BP 144/79 | HR 57 | Ht 69.0 in | Wt 248.2 lb

## 2017-05-19 DIAGNOSIS — I10 Essential (primary) hypertension: Secondary | ICD-10-CM

## 2017-05-19 DIAGNOSIS — E782 Mixed hyperlipidemia: Secondary | ICD-10-CM

## 2017-05-19 DIAGNOSIS — I5032 Chronic diastolic (congestive) heart failure: Secondary | ICD-10-CM

## 2017-05-19 DIAGNOSIS — I6529 Occlusion and stenosis of unspecified carotid artery: Secondary | ICD-10-CM | POA: Diagnosis not present

## 2017-05-19 DIAGNOSIS — R001 Bradycardia, unspecified: Secondary | ICD-10-CM | POA: Diagnosis not present

## 2017-05-19 MED ORDER — HYDRALAZINE HCL 25 MG PO TABS: 25.0000 mg | ORAL_TABLET | Freq: Two times a day (BID) | ORAL | 6 refills | Status: DC

## 2017-05-19 NOTE — Patient Instructions (Signed)
Medication Instructions:   Begin Hydralazine 25mg twice a day.  Continue all other medications.    Labwork: none  Testing/Procedures: none  Follow-Up: Your physician wants you to follow up in: 6 months.  You will receive a reminder letter in the mail one-two months in advance.  If you don't receive a letter, please call our office to schedule the follow up appointment   Any Other Special Instructions Will Be Listed Below (If Applicable).  If you need a refill on your cardiac medications before your next appointment, please call your pharmacy.  

## 2017-05-19 NOTE — Progress Notes (Signed)
Clinical Summary Mr. Balsam is a 76 y.o.male seen today for follow up of the following medical problems.   1. Bradycardia - chronic history of low heart rates.  - remains asymptomatic  2. HTN -  compliant with meds  3. Chronic diastolic HF - echo 01/785 since last visit showed LVEF 75-44%, grade I diastolic dysfunction.  - recent xray with some evidence of pulmonary edema, as well as LE edema. Improved since starting lasix   - swelling at times. Reports pcp stopped diuretic due to gout.  - since that times swelling is only mild. No recent SOB/DOE   4. Hyperlipidemia - 09/2016 TC 220 HDL 33 TG 165 LDL 157  - 03/2017 TC 109 HDL 31 TG 117 LDL 58   5. Chest pain - 12/2016 nuclear stress: no ischemia, exercised 5 minutes.   - no recent chest pain.   6. AAA screen - male over 70 with smoking - 10/2015 ABd [place]: no AAA  7. Carotid bruit - 12/2016 carotid US without significant disease   Past Medical History:  Diagnosis Date  . Arthritis    "fingers; knees; toes" (01/05/2014)  . Barrett's esophagus   . BPH (benign prostatic hyperplasia)   . Bradycardia 2009   Sinus rhythm in the 40s without symptoms;  Marland Kitchen GERD (gastroesophageal reflux disease)    ulcerative/erosive  . Gout   . Hemorrhoids   . Hiatal hernia   . History of skin cancer   . Hx of adenomatous colonic polyps   . Hyperlipidemia   . Hypertension   . Hypothyroidism    Following partial thyroidectomy  . Squamous carcinoma    skin cancer benign arms and right ear and rightside lip  . Tobacco abuse, in remission    10-20 pack years; discontinued in 1981     No Known Allergies   Current Outpatient Medications  Medication Sig Dispense Refill  . amLODipine (NORVASC) 10 MG tablet Take 10 mg by mouth every morning.     Marland Kitchen atorvastatin (LIPITOR) 40 MG tablet Take 1 tablet (40 mg total) by mouth daily. 30 tablet 6  . atorvastatin (LIPITOR) 40 MG tablet Take 40 mg by mouth daily.    . Colchicine 0.6  MG CAPS Take by mouth 2 (two) times daily.    Marland Kitchen levothyroxine (SYNTHROID, LEVOTHROID) 175 MCG tablet Take 1 tablet (175 mcg total) by mouth daily before breakfast. 30 tablet 6  . lisinopril (PRINIVIL,ZESTRIL) 40 MG tablet Take 1 tablet by mouth daily.    . tamsulosin (FLOMAX) 0.4 MG CAPS capsule Take 1 capsule by mouth daily.     No current facility-administered medications for this visit.      Past Surgical History:  Procedure Laterality Date  . ABDOMINAL HERNIA REPAIR  1990's  . CATARACT EXTRACTION W/ INTRAOCULAR LENS  IMPLANT, BILATERAL Bilateral 2012-2014  . COLONOSCOPY  12/2005   normal  . COLONOSCOPY  2004   tubulovillous adenoma of rectum   . ELBOW ARTHROSCOPY Left 2014  . ESOPHAGOGASTRODUODENOSCOPY  01/2006   geographic erosive RE, noncritical peptic stricture s/p dilation, antral erosion  . HERNIA REPAIR    . SQUAMOUS CELL CARCINOMA EXCISION     "cut off my hands; left arm; under right lip; right ear" (01/05/2014)  . THYROIDECTOMY, PARTIAL Right 1969   lobe-nodule present  . UMBILICAL HERNIA REPAIR  2000     No Known Allergies    Family History  Problem Relation Age of Onset  . Lung cancer Father 49  .  Colon polyps Brother   . Colon cancer Neg Hx   . Liver disease Neg Hx      Social History Mr. Innis reports that he quit smoking about 36 years ago. His smoking use included cigarettes. He started smoking about 60 years ago. He has a 20.00 pack-year smoking history. he has never used smokeless tobacco. Mr. Haque reports that he drinks about 0.6 oz of alcohol per week.   Review of Systems CONSTITUTIONAL: No weight loss, fever, chills, weakness or fatigue.  HEENT: Eyes: No visual loss, blurred vision, double vision or yellow sclerae.No hearing loss, sneezing, congestion, runny nose or sore throat.  SKIN: No rash or itching.  CARDIOVASCULAR: per hpi RESPIRATORY: No shortness of breath, cough or sputum.  GASTROINTESTINAL: No anorexia, nausea, vomiting or  diarrhea. No abdominal pain or blood.  GENITOURINARY: No burning on urination, no polyuria NEUROLOGICAL: No headache, dizziness, syncope, paralysis, ataxia, numbness or tingling in the extremities. No change in bowel or bladder control.  MUSCULOSKELETAL: No muscle, back pain, joint pain or stiffness.  LYMPHATICS: No enlarged nodes. No history of splenectomy.  PSYCHIATRIC: No history of depression or anxiety.  ENDOCRINOLOGIC: No reports of sweating, cold or heat intolerance. No polyuria or polydipsia.  Marland Kitchen   Physical Examination Vitals:   05/19/17 1338  BP: (!) 144/79  Pulse: (!) 57   Vitals:   05/19/17 1338  Weight: 248 lb 3.2 oz (112.6 kg)  Height: 5\' 9"  (1.753 m)    Gen: resting comfortably, no acute distress HEENT: no scleral icterus, pupils equal round and reactive, no palptable cervical adenopathy,  CV: RRR, no m/r/g, no jvd Resp: Clear to auscultation bilaterally GI: abdomen is soft, non-tender, non-distended, normal bowel sounds, no hepatosplenomegaly MSK: extremities are warm, no edema.  Skin: warm, no rash Neuro:  no focal deficits Psych: appropriate affect   Diagnostic Studies 09/2012 stress echo Study Conclusions  - Stress ECG conclusions: Abnormal ST changes noted - 1 to 2 mm depression in leads II, III, aVF, V4-V6 at peak heart rate, 1 mm in recovery with resolution by 7 minutes. No chest pain with ST changes. No arrhythmias. The stress ECG was consistent with myocardial ischemia. [facility] scoring: exercise time of 102min; maximum ST deviation of 75mm; no angina; resulting score is -3. This score predicts a moderate risk of cardiac events. - Staged echo: There was no echocardiographic evidence for stress-induced ischemia. This would suggest the possibility of "false positive" ECG findings, perhaps repolarization abnormalities.  09/2015 echo Study Conclusions  - Left ventricle: The cavity size was normal. Wall thickness was  normal.  Systolic function was normal. The estimated ejection  fraction was in the range of 60% to 65%. Doppler parameters are  consistent with abnormal left ventricular relaxation (grade 1  diastolic dysfunction). - Aortic valve: Valve area (VTI): 3.8 cm^2. Valve area (Vmax): 3.69  cm^2. Valve area (Vmean): 3.61 cm^2. - Aorta: The visualized portion of the aortic arch is midly dilated  at 3.6 cm. - Mitral valve: There was mild regurgitation. - Left atrium: The atrium was moderately dilated. 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 - Technically difficult study.  12/2016 nuclear stress  Blood pressure demonstrated a hypertensive response to exercise.  Downsloping ST segment depression ST segment depression of 0.5 mm was noted during stress in the II, III, aVF and V6 leads, and returning to baseline after less than 1 minute of recovery.  T wave inversion was noted during stress  in the II, III, aVF and V6 leads.  Nuclear myocardial perfusion was normal. No ischemia or scar.  This is a low risk study.  Nuclear stress EF: 63%.     Assessment and Plan   1. Bradycardia - chronic mild sinus bradycardia.Remains asymptomatic, continue to monitor.   2. HTN - above goal, start hydralazine bid.   3. Chronic diastolic HF - asymptomatic, continue current meds  4. Chest pain - recent negative stress test - continue to monitor  5. Hyperlipidemia - he will continue statin  6. Hypothyroidism -f/u with endocrine     Arnoldo Lenis, M.D.

## 2017-05-21 ENCOUNTER — Ambulatory Visit: Payer: Medicare Other | Admitting: Cardiology

## 2017-05-23 ENCOUNTER — Encounter: Payer: Self-pay | Admitting: Cardiology

## 2017-07-15 ENCOUNTER — Other Ambulatory Visit: Payer: Self-pay

## 2017-07-15 DIAGNOSIS — K2100 Gastro-esophageal reflux disease with esophagitis, without bleeding: Secondary | ICD-10-CM

## 2017-07-15 NOTE — Telephone Encounter (Signed)
Received a fax refill request fro [facility] for omeprazole 40 mg daily. pts last appointment was 06/2015, pts last TCS was 07/2015 with RMR.

## 2017-07-19 ENCOUNTER — Telehealth: Payer: Self-pay

## 2017-07-19 NOTE — Telephone Encounter (Signed)
Noted, sent note to EG.

## 2017-07-19 NOTE — Telephone Encounter (Signed)
Please send refill to [facility]([address])

## 2017-07-19 NOTE — Telephone Encounter (Addendum)
Please tell the patient I can send in a limited refill of his omeprazole.  However, it is been about 2 years since we have seen him and he will need a follow-up office visit for further refills.  Call if any questions or concerns.  Let me know when we have he fax and I can sign it.

## 2017-07-19 NOTE — Addendum Note (Signed)
Addended by: Gordy Levan, ERIC A on: 07/19/2017 10:08 AM   Modules accepted: Orders

## 2017-07-20 MED ORDER — OMEPRAZOLE 40 MG PO CPDR: 40.0000 mg | DELAYED_RELEASE_CAPSULE | Freq: Every day | ORAL | 2 refills | Status: DC

## 2017-07-20 NOTE — Telephone Encounter (Signed)
Rx was sent (for some reason it didn't go through yesterday.)

## 2017-07-20 NOTE — Telephone Encounter (Signed)
noted 

## 2017-07-20 NOTE — Addendum Note (Signed)
Addended by: Gordy Levan, ERIC A on: 07/20/2017 12:02 PM   Modules accepted: Orders

## 2017-08-30 LAB — T4, FREE: Free T4: 1.4 ng/dL (ref 0.8–1.8)

## 2017-08-30 LAB — TSH: TSH: 0.43 mIU/L (ref 0.40–4.50)

## 2017-09-09 ENCOUNTER — Ambulatory Visit (INDEPENDENT_AMBULATORY_CARE_PROVIDER_SITE_OTHER): Payer: Medicare Other | Admitting: "Endocrinology

## 2017-09-09 ENCOUNTER — Encounter: Payer: Self-pay | Admitting: "Endocrinology

## 2017-09-09 ENCOUNTER — Other Ambulatory Visit: Payer: Self-pay

## 2017-09-09 VITALS — BP 132/84 | HR 74 | Ht 69.0 in | Wt 254.0 lb

## 2017-09-09 DIAGNOSIS — E890 Postprocedural hypothyroidism: Secondary | ICD-10-CM

## 2017-09-09 MED ORDER — LEVOTHYROXINE SODIUM 175 MCG PO TABS: 175.0000 ug | ORAL_TABLET | Freq: Every day | ORAL | 4 refills | Status: DC

## 2017-09-09 NOTE — Progress Notes (Signed)
Subjective:    Patient ID: [name], male    DOB: 10-25-1940, PCP Michell Heinrich, DO   Past Medical History:  Diagnosis Date  . Arthritis    "fingers; knees; toes" (01/05/2014)  . Barrett's esophagus   . BPH (benign prostatic hyperplasia)   . Bradycardia 2009   Sinus rhythm in the 40s without symptoms;  Marland Kitchen GERD (gastroesophageal reflux disease)    ulcerative/erosive  . Gout   . Hemorrhoids   . Hiatal hernia   . History of skin cancer   . Hx of adenomatous colonic polyps   . Hyperlipidemia   . Hypertension   . Hypothyroidism    Following partial thyroidectomy  . Squamous carcinoma    skin cancer benign arms and right ear and rightside lip  . Tobacco abuse, in remission    10-20 pack years; discontinued in 1981   Past Surgical History:  Procedure Laterality Date  . ABDOMINAL HERNIA REPAIR  1990's  . CATARACT EXTRACTION W/ INTRAOCULAR LENS  IMPLANT, BILATERAL Bilateral 2012-2014  . COLONOSCOPY  12/2005   normal  . COLONOSCOPY  2004   tubulovillous adenoma of rectum   . COLONOSCOPY  01/12/2011   Dr. Gala Romney- normal rectum  . COLONOSCOPY N/A 07/09/2015   Procedure: COLONOSCOPY;  Surgeon: Daneil Dolin, MD;  [facility];  Service: Endoscopy;  Laterality: N/A;  730   . ELBOW ARTHROSCOPY Left 2014  . ESOPHAGEAL DILATION N/A 02/13/2015   Procedure: ESOPHAGEAL DILATION;  Surgeon: Daneil Dolin, MD;  [facility];  Service: Endoscopy;  Laterality: N/A;  . ESOPHAGOGASTRODUODENOSCOPY  01/2006   geographic erosive RE, noncritical peptic stricture s/p dilation, antral erosion  . ESOPHAGOGASTRODUODENOSCOPY  01/12/2011   Dr. Gala Romney- hiatal hernia, distal esophageal erosions, barretts esophagus  . ESOPHAGOGASTRODUODENOSCOPY  01/27/2012   Dr.Rourk- short segment barretts esophagus, erosive reflux esophagitis. bx= barretts esophagus, negative for dysplasia  . ESOPHAGOGASTRODUODENOSCOPY N/A 02/13/2015   Dr.Rourk- ulcerative/erosive reflux esophagitis, abnormal  esophagitis c/w prior diagnosis of barretts esophagus, hiatal hernia. bx=barretts esophagus  . HEMORRHOID SURGERY N/A 07/12/2015   Procedure: SIMPLE HEMORRHOIDECTOMY;  Surgeon: Aviva Signs, MD;  Location: [facility];  Service: General;  Laterality: N/A;  . [place]  01/12/2011   Procedure: [name] DILATION;  Surgeon: Daneil Dolin, MD;  [facility];  Service: Endoscopy;  Laterality: N/A;  56 french  . SQUAMOUS CELL CARCINOMA EXCISION     "cut off my hands; left arm; under right lip; right ear" (01/05/2014)  . THYROIDECTOMY, PARTIAL Right 1969   lobe-nodule present  . TOTAL KNEE ARTHROPLASTY Left 01/05/2014   Procedure: LEFT TOTAL KNEE ARTHROPLASTY;  Surgeon: Mcarthur Rossetti, MD;  Location: [facility];  Service: Orthopedics;  Laterality: Left;  . TOTAL KNEE ARTHROPLASTY Right 05/04/2014   Procedure: RIGHT TOTAL KNEE ARTHROPLASTY;  Surgeon: Mcarthur Rossetti, MD;  Location: [facility];  Service: Orthopedics;  Laterality: Right;  . UMBILICAL HERNIA REPAIR  2000   Social History   Socioeconomic History  . Marital status: Married    Spouse name: None  . Number of children: 2  . Years of education: None  . Highest education level: None  Social Needs  . Financial resource strain: None  . Food insecurity - worry: None  . Food insecurity - inability: None  . Transportation needs - medical: None  . Transportation needs - non-medical: None  Occupational History  . Occupation: [employer]    Comment: Current  employment  . Occupation: Engineer, civil (consulting)    Comment: Prior employment  Tobacco Use  . Smoking status: Former Smoker    Packs/day: 1.00    Years: 20.00    Pack years: 20.00    Types: Cigarettes    Start date: 07/06/1956    Last attempt to quit: 07/06/1980    Years since quitting: 37.2  . Smokeless tobacco: Never Used  . Tobacco comment: "quit smoking in ~ 1982"  Substance and Sexual Activity  . Alcohol use: Yes    Alcohol/week: 0.6 oz     Types: 1 Cans of beer per week    Comment: rare  . Drug use: No  . Sexual activity: None  Other Topics Concern  . None  Social History Narrative  . None   Outpatient Encounter Medications as of 09/09/2017  Medication Sig  . amLODipine (NORVASC) 10 MG tablet Take 10 mg by mouth every morning.   Marland Kitchen atorvastatin (LIPITOR) 40 MG tablet Take 1 tablet (40 mg total) by mouth daily.  . hydrALAZINE (APRESOLINE) 25 MG tablet Take 1 tablet (25 mg total) 2 (two) times daily by mouth.  Marland Kitchen lisinopril (PRINIVIL,ZESTRIL) 40 MG tablet Take 1 tablet by mouth daily.  Marland Kitchen omeprazole (PRILOSEC) 40 MG capsule Take 1 capsule (40 mg total) by mouth daily.  . tamsulosin (FLOMAX) 0.4 MG CAPS capsule Take 1 capsule by mouth daily.  . [DISCONTINUED] levothyroxine (SYNTHROID, LEVOTHROID) 175 MCG tablet Take 1 tablet (175 mcg total) by mouth daily before breakfast.  . [DISCONTINUED] levothyroxine (SYNTHROID, LEVOTHROID) 175 MCG tablet Take 1 tablet (175 mcg total) by mouth daily before breakfast.   No facility-administered encounter medications on file as of 09/09/2017.    ALLERGIES: No Known Allergies  VACCINATION STATUS:  There is no immunization history on file for this patient.  HPI 77 year old male patient with medical history as above. He is being seen in f/u for postsurgical hypothyroidism.  He underwent what appears to be partial thyroidectomy for benign pathology in 1969. He was treated with various doses of levothyroxine over the years, currently on levothyroxine 175 g by mouth every morning. He reports compliance to this medication. He  takes it orally in the morning with water.  - He now reports that 1 of his daughters is diagnosed with some thyroid dysfunction.  He denies family history of thyroid cancer.  -He denies heat/cold intolerance.  He gained 6 pounds, his last visit. - He denies exposure to neck radiation. -He denies dysphagia, shortness of breath, change in his voice.  - He denies heat nor  cold intolerance. He has steady weight over the last couple of years, but was unable to lose weight.  Review of Systems  Constitutional: + Overweight/obese most of his adult life, - fatigue, no subjective hyperthermia, no subjective hypothermia Eyes: no blurry vision, no xerophthalmia ENT: no sore throat, no nodules palpated in throat, no dysphagia/odynophagia, no hoarseness Cardiovascular: no Chest Pain, no Shortness of Breath, no palpitations, no leg swelling Respiratory: no cough, no SOB Gastrointestinal: no Nausea/Vomiting/Diarhhea Musculoskeletal: no muscle/joint aches Skin: no rashes Neurological: No tremors, no numbness, no tingling.   Psychiatric: no depression, no anxiety  Objective:    BP 132/84   Pulse 74   Ht 5\' 9"  (1.753 m)   Wt 254 lb (115.2 kg)   BMI 37.51 kg/m   Wt Readings from Last 3 Encounters:  09/09/17 254 lb (115.2 kg)  05/19/17 248 lb 3.2 oz (112.6 kg)  03/11/17 247 lb (112 kg)  Physical Exam  Constitutional: + obese,  not in acute distress, normal state of mind Eyes: PERRLA, EOMI, no exophthalmos ENT: moist mucous membranes, post thyroidectomy scar on his anterior lower neck, no thyromegaly, no cervical lymphadenopathy.  Cardiovascular: normal precordial activity, Regular Rate and Rhythm, no Murmur/Rubs/Gallops Respiratory:  adequate breathing efforts, no gross chest deformity, Clear to auscultation bilaterally Gastrointestinal: abdomen soft, Non -tender, No distension, Bowel Sounds present Musculoskeletal: no gross deformities, strength intact in all four extremities Skin: moist, warm, no rashes Neurological: no tremor with outstretched hands, Deep tendon reflexes normal in all four extremities.  CMP ( most recent) CMP     Component Value Date/Time   NA 141 07/10/2015 1100   K 4.1 07/10/2015 1100   CL 105 07/10/2015 1100   CO2 28 07/10/2015 1100   GLUCOSE 100 (H) 07/10/2015 1100   BUN 17 07/10/2015 1100   CREATININE 1.21 07/10/2015 1100    CREATININE 1.14 08/31/2012 1149   CALCIUM 9.8 07/10/2015 1100   PROT 6.8 08/31/2012 1149   ALBUMIN 4.5 08/31/2012 1149   AST 19 08/31/2012 1149   ALT 22 08/31/2012 1149   ALKPHOS 55 08/31/2012 1149   BILITOT 0.6 08/31/2012 1149   GFRNONAA 57 (L) 07/10/2015 1100   GFRAA >60 07/10/2015 1100       Lipid Panel ( most recent) Lipid Panel     Component Value Date/Time   CHOL 224 (H) 08/31/2012 1149   TRIG 258 (H) 08/31/2012 1149   HDL 35 (L) 08/31/2012 1149   CHOLHDL 6.4 08/31/2012 1149   VLDL 52 (H) 08/31/2012 1149   LDLCALC 137 (H) 08/31/2012 1149    Results for SAUD, BAIL (MRN [id number]) as of 09/09/2017 10:03  Ref. Range 02/23/2017 09:41 03/09/2017 12:10 08/30/2017 12:37  TSH Latest Ref Range: 0.40 - 4.50 mIU/L 6.36 (H)  0.43  Triiodothyronine,Free,Serum Latest Ref Range: 2.3 - 4.2 pg/mL 2.7    T4,Free(Direct) Latest Ref Range: 0.8 - 1.8 ng/dL 1.3  1.4    Labs from 09/28/2016 showed TSH 5.39 elevated, free T3 3 0.3, total T4 11.3. 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  Assessment & Plan:   1. Postsurgical hypothyroidism -His recent thyroid function tests are consistent with appropriate replacement.  I discussed and advised him to stay on his current dose of levothyroxine 175 mcg p.o. Nightly.  - We discussed about correct intake of levothyroxine, at fasting, with water, separated by at least 30 minutes from breakfast, and separated by more than 4 hours from calcium, iron, multivitamins, acid reflux medications (PPIs). -Patient is made aware of the fact that thyroid hormone replacement is needed for life, dose to be adjusted by periodic monitoring of thyroid function tests.  2. History of goiter status post partial thyroidectomy: - His repeat thyroid/neck ultrasound from 03/09/2017 is remarkable for surgical absent right lobe, 2.9 cm remnant left lobe with no discrete nodules or lesions. He will not  need any intervention.  - I advised patient to maintain close follow up with Michell Heinrich, DO for primary care needs. Follow up plan: Return in about 1 year (around 09/10/2018) for follow up with pre-visit labs.  Glade Lloyd, MD Phone: [phone]  Fax: [phone]   This note was partially dictated with voice recognition software. Similar sounding words can be transcribed inadequately or may not  be corrected upon review. 09/09/2017, 10:52 AM

## 2018-03-23 ENCOUNTER — Other Ambulatory Visit: Payer: Self-pay

## 2018-03-23 ENCOUNTER — Ambulatory Visit (INDEPENDENT_AMBULATORY_CARE_PROVIDER_SITE_OTHER): Payer: Medicare Other | Admitting: Nurse Practitioner

## 2018-03-23 ENCOUNTER — Encounter: Payer: Self-pay | Admitting: Nurse Practitioner

## 2018-03-23 VITALS — BP 160/89 | HR 69 | Temp 97.0°F | Ht 70.0 in | Wt 254.0 lb

## 2018-03-23 DIAGNOSIS — R1310 Dysphagia, unspecified: Secondary | ICD-10-CM

## 2018-03-23 DIAGNOSIS — K2100 Gastro-esophageal reflux disease with esophagitis, without bleeding: Secondary | ICD-10-CM

## 2018-03-23 DIAGNOSIS — K2270 Barrett's esophagus without dysplasia: Secondary | ICD-10-CM

## 2018-03-23 NOTE — Patient Instructions (Signed)
1. We will schedule your procedure for you. 2. Continue taking omeprazole once a day indefinitely.  Notify your pharmacy when you need a refill and they will contact our office. 3. Return for follow-up in 4 months to schedule your colonoscopy. 4. Call us if you have any questions or concerns.  At [facility] we value your feedback. You may receive a survey about your visit today. Please share your experience as we strive to create trusting relationships with our patients to provide genuine, compassionate, quality care.  We appreciate your understanding and patience as we review any laboratory studies, imaging, and other diagnostic tests that are ordered as we care for you. Our office policy is 5 business days for review of these results, and any emergent or urgent results are addressed in a timely manner for your best interest. If you do not hear from our office in 1 week, please contact us.   We also encourage the use of MyChart, which contains your medical information for your review as well. If you are not enrolled in this feature, an access code is on this after visit summary for your convenience. Thank you for allowing [place] to be involved in your care.  It was great to meet you today!  I hope you have a great Fall!!

## 2018-03-23 NOTE — Assessment & Plan Note (Signed)
Chronic GERD with esophagitis.  Has a history of Barrett's esophagus and remains on daily PPI.  His PPI controls his GERD symptoms well.  Continue to monitor symptoms and follow-up as needed.

## 2018-03-23 NOTE — Assessment & Plan Note (Signed)
History of Barrett's esophagus with no dysplasia.  His last EGD was completed in August 2016 and he is currently due.  He is having dysphasia symptoms as per above.  He is on a PPI daily which controls his GERD symptoms well.  I have reemphasized that he will need to be on this indefinitely.  At this point we will proceed with surveillance EGD with possible dilation due to dysphagia.  Follow-up in 4 months.  Proceed with EGD +/- dilation with Dr. Gala Romney in near future: the risks, benefits, and alternatives have been discussed with the patient in detail. The patient states understanding and desires to proceed.  The patient is not on any anticoagulants, anxiolytics, chronic pain medications, or antidepressants.  Conscious sedation should be adequate for his procedure as it was for his last.

## 2018-03-23 NOTE — H&P (View-Only) (Signed)
Referring Provider: Michell Heinrich, DO Primary Care Physician:  Michell Heinrich, DO Primary GI:  Dr. Gala Romney  Chief Complaint  Patient presents with  . barrett's    needs repeat EGD.   Marland Kitchen Dysphagia    with certain foods    HPI:   [name] is a 77 y.o. male who presents on recall for repeat EGD due to Barrett's esophagus.  He is also complaining of dysphasia.  The patient was last seen in our office 06/11/2015 to follow-up on GERD.  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  Chronic constipation and distant history of tubulovillous adenoma with a single episode of rectal bleeding.  Omeprazole controls GERD well.  Recommend continue PPI, schedule colonoscopy for January 2017, further recommendations to follow.  Colonoscopy up-to-date 07/09/2015 with multiple colon polyps found to be tubular adenoma and recommended repeat in 3 years (January 2020).  Last EGD completed 02/13/2015 which found ulcerative reflux esophagitis, abnormal esophagus consistent with prior diagnosis of Barrett's status post dilation and biopsy, hiatal hernia.  Recommended increase omeprazole to 40 mill grams twice daily for 30 days and then once daily indefinitely.  Surgical pathology found the biopsies to be consistent with Barrett's esophagus without dysplasia.  Commended repeat EGD in 3 years.  He is currently due.  Today he states he's doing ok overall. Has been having solid food dysphagia primarily with meats, occasional regurgitation but typically passes with increased fluids. If he drinks soda he has significant burning. Denies abdominal pain, N/V, fever, chills, hematochezia, melena, unintentional weight loss. Denies chest pain, dyspnea, dizziness, lightheadedness, syncope, near syncope. Denies any other upper or lower GI symptoms.  Past Medical History:  Diagnosis Date  . Arthritis    "fingers; knees; toes" (01/05/2014)  .  Barrett's esophagus   . BPH (benign prostatic hyperplasia)   . Bradycardia 2009   Sinus rhythm in the 40s without symptoms;  Marland Kitchen GERD (gastroesophageal reflux disease)    ulcerative/erosive  . Gout   . Hemorrhoids   . Hiatal hernia   . History of skin cancer   . Hx of adenomatous colonic polyps   . Hyperlipidemia   . Hypertension   . Hypothyroidism    Following partial thyroidectomy  . Squamous carcinoma    skin cancer benign arms and right ear and rightside lip  . Tobacco abuse, in remission    10-20 pack years; discontinued in 1981    Past Surgical History:  Procedure Laterality Date  . ABDOMINAL HERNIA REPAIR  1990's  . CATARACT EXTRACTION W/ INTRAOCULAR LENS  IMPLANT, BILATERAL Bilateral 2012-2014  . COLONOSCOPY  12/2005   normal  . COLONOSCOPY  2004   tubulovillous adenoma of rectum   . COLONOSCOPY  01/12/2011   Dr. Gala Romney- normal rectum  . COLONOSCOPY N/A 07/09/2015   Procedure: COLONOSCOPY;  Surgeon: Daneil Dolin, MD;  [facility];  Service: Endoscopy;  Laterality: N/A;  730   . ELBOW ARTHROSCOPY Left 2014  . ESOPHAGEAL DILATION N/A 02/13/2015   Procedure: ESOPHAGEAL DILATION;  Surgeon: Daneil Dolin, MD;  [facility];  Service: Endoscopy;  Laterality: N/A;  . ESOPHAGOGASTRODUODENOSCOPY  01/2006   geographic erosive RE, noncritical peptic stricture s/p dilation, antral erosion  . ESOPHAGOGASTRODUODENOSCOPY  01/12/2011   Dr. Gala Romney- hiatal hernia, distal esophageal erosions, barretts esophagus  . ESOPHAGOGASTRODUODENOSCOPY  01/27/2012   Dr.Rourk- short segment barretts esophagus, erosive reflux esophagitis. bx= barretts esophagus, negative  for dysplasia  . ESOPHAGOGASTRODUODENOSCOPY N/A 02/13/2015   Dr.Rourk- ulcerative/erosive reflux esophagitis, abnormal esophagitis c/w prior diagnosis of barretts esophagus, hiatal hernia. bx=barretts esophagus  . HEMORRHOID SURGERY N/A 07/12/2015   Procedure: SIMPLE HEMORRHOIDECTOMY;  Surgeon: Aviva Signs, MD;   Location: [facility];  Service: General;  Laterality: N/A;  . [employer]  01/12/2011   Procedure: [name] DILATION;  Surgeon: Daneil Dolin, MD;  [facility];  Service: Endoscopy;  Laterality: N/A;  56 french  . SQUAMOUS CELL CARCINOMA EXCISION     "cut off my hands; left arm; under right lip; right ear" (01/05/2014)  . THYROIDECTOMY, PARTIAL Right 1969   lobe-nodule present  . TOTAL KNEE ARTHROPLASTY Left 01/05/2014   Procedure: LEFT TOTAL KNEE ARTHROPLASTY;  Surgeon: Mcarthur Rossetti, MD;  Location: [place];  Service: Orthopedics;  Laterality: Left;  . TOTAL KNEE ARTHROPLASTY Right 05/04/2014   Procedure: RIGHT TOTAL KNEE ARTHROPLASTY;  Surgeon: Mcarthur Rossetti, MD;  [facility];  Service: Orthopedics;  Laterality: Right;  . UMBILICAL HERNIA REPAIR  2000    Current Outpatient Medications  Medication Sig Dispense Refill  . amLODipine (NORVASC) 10 MG tablet Take 10 mg by mouth every morning.     Marland Kitchen atorvastatin (LIPITOR) 40 MG tablet Take 1 tablet (40 mg total) by mouth daily. 30 tablet 6  . hydrALAZINE (APRESOLINE) 25 MG tablet Take 1 tablet (25 mg total) 2 (two) times daily by mouth. 60 tablet 6  . levothyroxine (SYNTHROID, LEVOTHROID) 175 MCG tablet Take 1 tablet (175 mcg total) by mouth daily before breakfast. 90 tablet 4  . lisinopril (PRINIVIL,ZESTRIL) 40 MG tablet Take 1 tablet by mouth daily.    Marland Kitchen omeprazole (PRILOSEC) 40 MG capsule Take 1 capsule (40 mg total) by mouth daily. 30 capsule 2  . tamsulosin (FLOMAX) 0.4 MG CAPS capsule Take 1 capsule by mouth daily.     No current facility-administered medications for this visit.     Allergies as of 03/23/2018  . (No Known Allergies)    Family History  Problem Relation Age of Onset  . Lung cancer Father 64  . Colon polyps Brother   . Colon cancer Neg Hx   . Liver disease Neg Hx     Social History   Socioeconomic History  . Marital status: Married    Spouse name: Not on file    . Number of children: 2  . Years of education: Not on file  . Highest education level: Not on file  Occupational History  . Occupation: [employer]    Comment: Current employment  . Occupation: Scientist, research (life sciences): Prior employment  Social Needs  . Financial resource strain: Not on file  . Food insecurity:    Worry: Not on file    Inability: Not on file  . Transportation needs:    Medical: Not on file    Non-medical: Not on file  Tobacco Use  . Smoking status: Former Smoker    Packs/day: 1.00    Years: 20.00    Pack years: 20.00    Types: Cigarettes    Start date: 07/06/1956    Last attempt to quit: 07/06/1980    Years since quitting: 37.7  . Smokeless tobacco: Never Used  . Tobacco comment: "quit smoking in ~ 1982"  Substance and Sexual Activity  . Alcohol use: Yes    Alcohol/week: 1.0 standard drinks    Types: 1 Cans of beer per week  Comment: rare  . Drug use: No  . Sexual activity: Not on file  Lifestyle  . Physical activity:    Days per week: Not on file    Minutes per session: Not on file  . Stress: Not on file  Relationships  . Social connections:    Talks on phone: Not on file    Gets together: Not on file    Attends religious service: Not on file    Active member of club or organization: Not on file    Attends meetings of clubs or organizations: Not on file    Relationship status: Not on file  Other Topics Concern  . Not on file  Social History Narrative  . Not on file    Review of Systems: General: Negative for anorexia, weight loss, fever, chills, fatigue, weakness. ENT: Negative for hoarseness. Admits dysphagia. CV: Negative for chest pain, angina, palpitations, peripheral edema.  Respiratory: Negative for dyspnea at rest, cough, sputum, wheezing.  GI: See history of present illness. Endo: Negative for unusual weight change.  Heme: Negative for bruising or bleeding. Allergy: Negative for rash or hives.   Physical Exam: BP (!)  160/89   Pulse 69   Temp (!) 97 F (36.1 C) (Oral)   Ht 5\' 10"  (1.778 m)   Wt 254 lb (115.2 kg)   BMI 36.45 kg/m  General:   Alert and oriented. Pleasant and cooperative. Well-nourished and well-developed.  Eyes:  Without icterus, sclera clear and conjunctiva pink.  Ears:  Normal auditory acuity. Cardiovascular:  S1, S2 present without murmurs appreciated. Extremities without clubbing or edema. Respiratory:  Clear to auscultation bilaterally. No wheezes, rales, or rhonchi. No distress.  Gastrointestinal:  +BS, soft, non-tender and non-distended. No HSM noted. No guarding or rebound. No masses appreciated.  Rectal:  Deferred  Musculoskalatal:  Symmetrical without gross deformities. Neurologic:  Alert and oriented x4;  grossly normal neurologically. Psych:  Alert and cooperative. Normal mood and affect. Heme/Lymph/Immune: No excessive bruising noted.    03/23/2018 11:34 AM   Disclaimer: This note was dictated with voice recognition software. Similar sounding words can inadvertently be transcribed and may not be corrected upon review.

## 2018-03-23 NOTE — Assessment & Plan Note (Signed)
The patient notes solid food dysphagia primarily with meats.  Typically will pass with fluids.  Occasionally regurgitation.  He is on daily PPI and I reemphasized that he will need to be on this indefinitely due to Barrett's.  Recommend he continue PPI.  He is due for an upper endoscopy for Barrett's surveillance and we will add a possible dilation, as per below.  Return for follow-up in 4 months to schedule his colonoscopy which will be due around that time.  Further recommendations to follow.

## 2018-03-23 NOTE — Progress Notes (Signed)
Referring Provider: Michell Heinrich, DO Primary Care Physician:  Michell Heinrich, DO Primary GI:  Dr. Gala Romney  Chief Complaint  Patient presents with  . barrett's    needs repeat EGD.   Marland Kitchen Dysphagia    with certain foods    HPI:   [name] is a 77 y.o. male who presents on recall for repeat EGD due to Barrett's esophagus.  He is also complaining of dysphasia.  The patient was last seen in our office 06/11/2015 to follow-up on GERD.  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  Chronic constipation and distant history of tubulovillous adenoma with a single episode of rectal bleeding.  Omeprazole controls GERD well.  Recommend continue PPI, schedule colonoscopy for January 2017, further recommendations to follow.  Colonoscopy up-to-date 07/09/2015 with multiple colon polyps found to be tubular adenoma and recommended repeat in 3 years (January 2020).  Last EGD completed 02/13/2015 which found ulcerative reflux esophagitis, abnormal esophagus consistent with prior diagnosis of Barrett's status post dilation and biopsy, hiatal hernia.  Recommended increase omeprazole to 40 mill grams twice daily for 30 days and then once daily indefinitely.  Surgical pathology found the biopsies to be consistent with Barrett's esophagus without dysplasia.  Commended repeat EGD in 3 years.  He is currently due.  Today he states he's doing ok overall. Has been having solid food dysphagia primarily with meats, occasional regurgitation but typically passes with increased fluids. If he drinks soda he has significant burning. Denies abdominal pain, N/V, fever, chills, hematochezia, melena, unintentional weight loss. Denies chest pain, dyspnea, dizziness, lightheadedness, syncope, near syncope. Denies any other upper or lower GI symptoms.  Past Medical History:  Diagnosis Date  . Arthritis    "fingers; knees; toes" (01/05/2014)  .  Barrett's esophagus   . BPH (benign prostatic hyperplasia)   . Bradycardia 2009   Sinus rhythm in the 40s without symptoms;  Marland Kitchen GERD (gastroesophageal reflux disease)    ulcerative/erosive  . Gout   . Hemorrhoids   . Hiatal hernia   . History of skin cancer   . Hx of adenomatous colonic polyps   . Hyperlipidemia   . Hypertension   . Hypothyroidism    Following partial thyroidectomy  . Squamous carcinoma    skin cancer benign arms and right ear and rightside lip  . Tobacco abuse, in remission    10-20 pack years; discontinued in 1981    Past Surgical History:  Procedure Laterality Date  . ABDOMINAL HERNIA REPAIR  1990's  . CATARACT EXTRACTION W/ INTRAOCULAR LENS  IMPLANT, BILATERAL Bilateral 2012-2014  . COLONOSCOPY  12/2005   normal  . COLONOSCOPY  2004   tubulovillous adenoma of rectum   . COLONOSCOPY  01/12/2011   Dr. Gala Romney- normal rectum  . COLONOSCOPY N/A 07/09/2015   Procedure: COLONOSCOPY;  Surgeon: Daneil Dolin, MD;  Location: [facility];  Service: Endoscopy;  Laterality: N/A;  730   . ELBOW ARTHROSCOPY Left 2014  . ESOPHAGEAL DILATION N/A 02/13/2015   Procedure: ESOPHAGEAL DILATION;  Surgeon: Daneil Dolin, MD;  Location: [facility];  Service: Endoscopy;  Laterality: N/A;  . ESOPHAGOGASTRODUODENOSCOPY  01/2006   geographic erosive RE, noncritical peptic stricture s/p dilation, antral erosion  . ESOPHAGOGASTRODUODENOSCOPY  01/12/2011   Dr. Gala Romney- hiatal hernia, distal esophageal erosions, barretts esophagus  . ESOPHAGOGASTRODUODENOSCOPY  01/27/2012   Dr.Rourk- short segment barretts esophagus, erosive reflux esophagitis. bx= barretts esophagus, negative  for dysplasia  . ESOPHAGOGASTRODUODENOSCOPY N/A 02/13/2015   Dr.Rourk- ulcerative/erosive reflux esophagitis, abnormal esophagitis c/w prior diagnosis of barretts esophagus, hiatal hernia. bx=barretts esophagus  . HEMORRHOID SURGERY N/A 07/12/2015   Procedure: SIMPLE HEMORRHOIDECTOMY;  Surgeon: Aviva Signs, MD;   Location: [facility];  Service: General;  Laterality: N/A;  . [place]  01/12/2011   Procedure: [name] DILATION;  Surgeon: Daneil Dolin, MD;  Location: [facility];  Service: Endoscopy;  Laterality: N/A;  56 french  . SQUAMOUS CELL CARCINOMA EXCISION     "cut off my hands; left arm; under right lip; right ear" (01/05/2014)  . THYROIDECTOMY, PARTIAL Right 1969   lobe-nodule present  . TOTAL KNEE ARTHROPLASTY Left 01/05/2014   Procedure: LEFT TOTAL KNEE ARTHROPLASTY;  Surgeon: Mcarthur Rossetti, MD;  Location: [facility];  Service: Orthopedics;  Laterality: Left;  . TOTAL KNEE ARTHROPLASTY Right 05/04/2014   Procedure: RIGHT TOTAL KNEE ARTHROPLASTY;  Surgeon: Mcarthur Rossetti, MD;  Location: [facility];  Service: Orthopedics;  Laterality: Right;  . UMBILICAL HERNIA REPAIR  2000    Current Outpatient Medications  Medication Sig Dispense Refill  . amLODipine (NORVASC) 10 MG tablet Take 10 mg by mouth every morning.     Marland Kitchen atorvastatin (LIPITOR) 40 MG tablet Take 1 tablet (40 mg total) by mouth daily. 30 tablet 6  . hydrALAZINE (APRESOLINE) 25 MG tablet Take 1 tablet (25 mg total) 2 (two) times daily by mouth. 60 tablet 6  . levothyroxine (SYNTHROID, LEVOTHROID) 175 MCG tablet Take 1 tablet (175 mcg total) by mouth daily before breakfast. 90 tablet 4  . lisinopril (PRINIVIL,ZESTRIL) 40 MG tablet Take 1 tablet by mouth daily.    Marland Kitchen omeprazole (PRILOSEC) 40 MG capsule Take 1 capsule (40 mg total) by mouth daily. 30 capsule 2  . tamsulosin (FLOMAX) 0.4 MG CAPS capsule Take 1 capsule by mouth daily.     No current facility-administered medications for this visit.     Allergies as of 03/23/2018  . (No Known Allergies)    Family History  Problem Relation Age of Onset  . Lung cancer Father 78  . Colon polyps Brother   . Colon cancer Neg Hx   . Liver disease Neg Hx     Social History   Socioeconomic History  . Marital status: Married    Spouse name: Not on file    . Number of children: 2  . Years of education: Not on file  . Highest education level: Not on file  Occupational History  . Occupation: [employer]    Comment: Current employment  . Occupation: Scientist, research (life sciences): Prior employment  Social Needs  . Financial resource strain: Not on file  . Food insecurity:    Worry: Not on file    Inability: Not on file  . Transportation needs:    Medical: Not on file    Non-medical: Not on file  Tobacco Use  . Smoking status: Former Smoker    Packs/day: 1.00    Years: 20.00    Pack years: 20.00    Types: Cigarettes    Start date: 07/06/1956    Last attempt to quit: 07/06/1980    Years since quitting: 37.7  . Smokeless tobacco: Never Used  . Tobacco comment: "quit smoking in ~ 1982"  Substance and Sexual Activity  . Alcohol use: Yes    Alcohol/week: 1.0 standard drinks    Types: 1 Cans of beer per week  Comment: rare  . Drug use: No  . Sexual activity: Not on file  Lifestyle  . Physical activity:    Days per week: Not on file    Minutes per session: Not on file  . Stress: Not on file  Relationships  . Social connections:    Talks on phone: Not on file    Gets together: Not on file    Attends religious service: Not on file    Active member of club or organization: Not on file    Attends meetings of clubs or organizations: Not on file    Relationship status: Not on file  Other Topics Concern  . Not on file  Social History Narrative  . Not on file    Review of Systems: General: Negative for anorexia, weight loss, fever, chills, fatigue, weakness. ENT: Negative for hoarseness. Admits dysphagia. CV: Negative for chest pain, angina, palpitations, peripheral edema.  Respiratory: Negative for dyspnea at rest, cough, sputum, wheezing.  GI: See history of present illness. Endo: Negative for unusual weight change.  Heme: Negative for bruising or bleeding. Allergy: Negative for rash or hives.   Physical Exam: BP (!)  160/89   Pulse 69   Temp (!) 97 F (36.1 C) (Oral)   Ht 5\' 10"  (1.778 m)   Wt 254 lb (115.2 kg)   BMI 36.45 kg/m  General:   Alert and oriented. Pleasant and cooperative. Well-nourished and well-developed.  Eyes:  Without icterus, sclera clear and conjunctiva pink.  Ears:  Normal auditory acuity. Cardiovascular:  S1, S2 present without murmurs appreciated. Extremities without clubbing or edema. Respiratory:  Clear to auscultation bilaterally. No wheezes, rales, or rhonchi. No distress.  Gastrointestinal:  +BS, soft, non-tender and non-distended. No HSM noted. No guarding or rebound. No masses appreciated.  Rectal:  Deferred  Musculoskalatal:  Symmetrical without gross deformities. Neurologic:  Alert and oriented x4;  grossly normal neurologically. Psych:  Alert and cooperative. Normal mood and affect. Heme/Lymph/Immune: No excessive bruising noted.    03/23/2018 11:34 AM   Disclaimer: This note was dictated with voice recognition software. Similar sounding words can inadvertently be transcribed and may not be corrected upon review.

## 2018-03-23 NOTE — Progress Notes (Signed)
CC'D TO PCP °

## 2018-04-07 LAB — LIPID PANEL
Cholesterol: 130 (ref 0–200)
HDL: 30 — AB (ref 35–70)
LDL Cholesterol: 73
Triglycerides: 175 — AB (ref 40–160)

## 2018-04-07 LAB — TSH: TSH: 0.22 — AB (ref 0.41–5.90)

## 2018-04-13 ENCOUNTER — Other Ambulatory Visit: Payer: Self-pay

## 2018-04-13 ENCOUNTER — Encounter (HOSPITAL_COMMUNITY): Payer: Self-pay

## 2018-04-13 ENCOUNTER — Ambulatory Visit (HOSPITAL_COMMUNITY)
Admission: RE | Admit: 2018-04-13 | Discharge: 2018-04-13 | Disposition: A | Discharge status: Home / Self Care | Payer: Medicare Other | Source: Ambulatory Visit | Attending: Internal Medicine | Admitting: Internal Medicine

## 2018-04-13 ENCOUNTER — Encounter (HOSPITAL_COMMUNITY)
Admission: RE | Disposition: A | Discharge status: Home / Self Care | Payer: Self-pay | Source: Ambulatory Visit | Attending: Internal Medicine

## 2018-04-13 DIAGNOSIS — R1310 Dysphagia, unspecified: Secondary | ICD-10-CM | POA: Insufficient documentation

## 2018-04-13 DIAGNOSIS — K222 Esophageal obstruction: Secondary | ICD-10-CM | POA: Diagnosis not present

## 2018-04-13 DIAGNOSIS — Z96653 Presence of artificial knee joint, bilateral: Secondary | ICD-10-CM | POA: Diagnosis not present

## 2018-04-13 DIAGNOSIS — I10 Essential (primary) hypertension: Secondary | ICD-10-CM | POA: Insufficient documentation

## 2018-04-13 DIAGNOSIS — Z87891 Personal history of nicotine dependence: Secondary | ICD-10-CM | POA: Insufficient documentation

## 2018-04-13 DIAGNOSIS — K2270 Barrett's esophagus without dysplasia: Secondary | ICD-10-CM | POA: Diagnosis not present

## 2018-04-13 DIAGNOSIS — M1990 Unspecified osteoarthritis, unspecified site: Secondary | ICD-10-CM | POA: Insufficient documentation

## 2018-04-13 DIAGNOSIS — Z79890 Hormone replacement therapy (postmenopausal): Secondary | ICD-10-CM | POA: Insufficient documentation

## 2018-04-13 DIAGNOSIS — Z85828 Personal history of other malignant neoplasm of skin: Secondary | ICD-10-CM | POA: Insufficient documentation

## 2018-04-13 DIAGNOSIS — E039 Hypothyroidism, unspecified: Secondary | ICD-10-CM | POA: Diagnosis not present

## 2018-04-13 DIAGNOSIS — K219 Gastro-esophageal reflux disease without esophagitis: Secondary | ICD-10-CM | POA: Diagnosis not present

## 2018-04-13 DIAGNOSIS — K2100 Gastro-esophageal reflux disease with esophagitis, without bleeding: Secondary | ICD-10-CM

## 2018-04-13 DIAGNOSIS — K228 Other specified diseases of esophagus: Secondary | ICD-10-CM

## 2018-04-13 DIAGNOSIS — Z79899 Other long term (current) drug therapy: Secondary | ICD-10-CM | POA: Diagnosis not present

## 2018-04-13 DIAGNOSIS — E785 Hyperlipidemia, unspecified: Secondary | ICD-10-CM | POA: Diagnosis not present

## 2018-04-13 HISTORY — PX: ESOPHAGOGASTRODUODENOSCOPY: SHX5428

## 2018-04-13 HISTORY — PX: BIOPSY: SHX5522

## 2018-04-13 HISTORY — PX: MALONEY DILATION: SHX5535

## 2018-04-13 SURGERY — EGD (ESOPHAGOGASTRODUODENOSCOPY)
Anesthesia: Moderate Sedation

## 2018-04-13 MED ORDER — ONDANSETRON HCL 4 MG/2ML IJ SOLN
INTRAMUSCULAR | Status: AC
  Filled 2018-04-13: qty 2

## 2018-04-13 MED ORDER — MEPERIDINE HCL 50 MG/ML IJ SOLN
INTRAMUSCULAR | Status: AC
  Filled 2018-04-13: qty 1

## 2018-04-13 MED ORDER — ONDANSETRON HCL 4 MG/2ML IJ SOLN
INTRAMUSCULAR | Status: DC | PRN
  Administered 2018-04-13: 4 mg via INTRAVENOUS

## 2018-04-13 MED ORDER — MIDAZOLAM HCL 5 MG/5ML IJ SOLN
INTRAMUSCULAR | Status: AC
  Filled 2018-04-13: qty 10

## 2018-04-13 MED ORDER — LIDOCAINE VISCOUS HCL 2 % MT SOLN
OROMUCOSAL | Status: DC | PRN
  Administered 2018-04-13: 6 mL via OROMUCOSAL

## 2018-04-13 MED ORDER — LIDOCAINE VISCOUS HCL 2 % MT SOLN
OROMUCOSAL | Status: AC
  Filled 2018-04-13: qty 15

## 2018-04-13 MED ORDER — SODIUM CHLORIDE 0.9 % IV SOLN
INTRAVENOUS | Status: DC
  Administered 2018-04-13: 13:00:00 via INTRAVENOUS

## 2018-04-13 MED ORDER — MIDAZOLAM HCL 5 MG/5ML IJ SOLN
INTRAMUSCULAR | Status: DC | PRN
  Administered 2018-04-13: 2 mg via INTRAVENOUS
  Administered 2018-04-13: 1 mg via INTRAVENOUS

## 2018-04-13 MED ORDER — MEPERIDINE HCL 100 MG/ML IJ SOLN
INTRAMUSCULAR | Status: DC | PRN
  Administered 2018-04-13: 15 mg via INTRAVENOUS
  Administered 2018-04-13: 25 mg via INTRAVENOUS

## 2018-04-13 MED ORDER — STERILE WATER FOR IRRIGATION IR SOLN
Status: DC | PRN
  Administered 2018-04-13: 4 mL

## 2018-04-13 NOTE — Interval H&P Note (Signed)
History and Physical Interval Note:  04/13/2018 1:06 PM  [name]  has presented today for surgery, with the diagnosis of Barrett's, GERD, dysphagia  The various methods of treatment have been discussed with the patient and family. After consideration of risks, benefits and other options for treatment, the patient has consented to  Procedure(s) with comments: ESOPHAGOGASTRODUODENOSCOPY (EGD) (N/A) - 2:15pm MALONEY DILATION (N/A) as a surgical intervention .  The patient's history has been reviewed, patient examined, no change in status, stable for surgery.  I have reviewed the patient's chart and labs.  Questions were answered to the patient's satisfaction.    No change.  EGD with ED as feasible/appropriate per plan.  The risks, benefits, limitations, alternatives and imponderables have been reviewed with the patient. Potential for esophageal dilation, biopsy, etc. have also been reviewed.  Questions have been answered. All parties agreeable.   Manus Rudd

## 2018-04-13 NOTE — Op Note (Signed)
[facility] Patient Name: [name] Procedure Date: 04/13/2018 12:57 PM MRN: [id number] Date of Birth: [birth year] Attending MD: Norvel Richards , MD CSN: [id number] Age: 77 Admit Type: Outpatient Procedure:                Upper GI endoscopy Indications:              Dysphagia Providers:                Norvel Richards, MD, Hinton Rao, RN, Aram Candela Referring MD:              Medicines:                Midazolam 3 mg IV, Meperidine 40 mg IV, Ondansetron                            4 mg IV Complications:            No immediate complications. Estimated Blood Loss:     Estimated blood loss was minimal. Procedure:                Pre-Anesthesia Assessment:                           - Prior to the procedure, a History and Physical                            was performed, and patient medications and                            allergies were reviewed. The patient's tolerance of                            previous anesthesia was also reviewed. The risks                            and benefits of the procedure and the sedation                            options and risks were discussed with the patient.                            All questions were answered, and informed consent                            was obtained. Prior Anticoagulants: The patient has                            taken no previous anticoagulant or antiplatelet                            agents. ASA Grade Assessment: II - A patient with  mild systemic disease. After reviewing the risks                            and benefits, the patient was deemed in                            satisfactory condition to undergo the procedure.                           After obtaining informed consent, the endoscope was                            passed under direct vision. Throughout the                            procedure, the patient's blood pressure, pulse, and                            oxygen saturations were monitored continuously. The                            GIF-H190 ([id number]) scope was introduced through the                            mouth, and advanced to the second part of duodenum.                            The upper GI endoscopy was accomplished without                            difficulty. The patient tolerated the procedure                            well. The upper GI endoscopy was accomplished                            without difficulty. The patient tolerated the                            procedure well. Scope In: 1:26:12 PM Scope Out: 1:33:35 PM Total Procedure Duration: 0 hours 7 minutes 23 seconds  Findings:      Mucosal changes were found at the gastroesophageal junction.       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 3       skinny tongues of salmon-colored epithelium coming up above the GE       junction. No nodularity. no tumor. No esophagitis. Tubular esophagus       appeared patent throughout its course. .      A small hiatal hernia was present.      The exam was otherwise without abnormality.      The duodenal bulb and second portion of the duodenum were normal. The       scope was withdrawn. Dilation was performed with a Maloney dilator with       mild resistance at 56 Fr. The dilation site  was examined following       endoscope reinsertion and showed no change. Estimated blood loss: none.       Finally, abnormal distal esophageal mucosa was biopsied with a cold       forceps for histology. Estimated blood loss was minimal. Impression:               - Mucosal changes in the esophagus consistent with                            prior diagnosis of Barrett's esophagus -biopsied                            after Maloney dilation                           - Small hiatal hernia.                           - The examination was otherwise normal.                           - Normal duodenal bulb and  second portion of the                            duodenum.                           - No specimens collected. Moderate Sedation:      Moderate (conscious) sedation was administered by the endoscopy nurse       and supervised by the endoscopist. The following parameters were       monitored: oxygen saturation, heart rate, blood pressure, respiratory       rate, EKG, adequacy of pulmonary ventilation, and response to care.       Total physician intraservice time was 15 minutes. Recommendation:           - Patient has a contact number available for                            emergencies. The signs and symptoms of potential                            delayed complications were discussed with the                            patient. Return to normal activities tomorrow.                            Written discharge instructions were provided to the                            patient.                           - Resume previous diet.                           -  Continue present medications.                           - Repeat upper endoscopy after studies are complete                            for surveillance based on pathology results.                           - Return to GI clinic in 1 year. Procedure Code(s):        --- Professional ---                           [phone], Esophagogastroduodenoscopy, flexible,                            transoral; with biopsy, single or multiple                           43450, Dilation of esophagus, by unguided sound or                            bougie, single or multiple passes                           G0500, Moderate sedation services provided by the                            same physician or other qualified health care                            professional performing a gastrointestinal                            endoscopic service that sedation supports,                            requiring the presence of an independent trained                             observer to assist in the monitoring of the                            patient's level of consciousness and physiological                            status; initial 15 minutes of intra-service time;                            patient age 77 years or older (additional time may                            be reported with [phone], as appropriate) Diagnosis Code(s):        --- Professional ---  K22.8, Other specified diseases of esophagus                           K44.9, Diaphragmatic hernia without obstruction or                            gangrene                           R13.10, Dysphagia, unspecified CPT copyright 2018 American Medical Association. All rights reserved. The codes documented in this report are preliminary and upon coder review may  be revised to meet current compliance requirements. [name]. Lamarius Dirr, MD Norvel Richards, MD 04/13/2018 1:44:58 PM This report has been signed electronically. Number of Addenda: 0

## 2018-04-13 NOTE — Discharge Instructions (Signed)
EGD Discharge instructions Please read the instructions outlined below and refer to this sheet in the next few weeks. These discharge instructions provide you with general information on caring for yourself after you leave the hospital. Your doctor may also give you specific instructions. While your treatment has been planned according to the most current medical practices available, unavoidable complications occasionally occur. If you have any problems or questions after discharge, please call your doctor. ACTIVITY  You may resume your regular activity but move at a slower pace for the next 24 hours.   Take frequent rest periods for the next 24 hours.   Walking will help expel (get rid of) the air and reduce the bloated feeling in your abdomen.   No driving for 24 hours (because of the anesthesia (medicine) used during the test).   You may shower.   Do not sign any important legal documents or operate any machinery for 24 hours (because of the anesthesia used during the test).  NUTRITION  Drink plenty of fluids.   You may resume your normal diet.   Begin with a light meal and progress to your normal diet.   Avoid alcoholic beverages for 24 hours or as instructed by your caregiver.  MEDICATIONS  You may resume your normal medications unless your caregiver tells you otherwise.  WHAT YOU CAN EXPECT TODAY  You may experience abdominal discomfort such as a feeling of fullness or gas pains.  FOLLOW-UP  Your doctor will discuss the results of your test with you.  SEEK IMMEDIATE MEDICAL ATTENTION IF ANY OF THE FOLLOWING OCCUR:  Excessive nausea (feeling sick to your stomach) and/or vomiting.   Severe abdominal pain and distention (swelling).   Trouble swallowing.   Temperature over 101 F (37.8 C).   Rectal bleeding or vomiting of blood.    Information on GERD and Barrett's esophagus provided  Continue omeprazole 40 mg every day  Further recommendations to follow  pending review of pathology report  Office visit with [place] in 1 year.     Gastroesophageal Reflux Disease, Adult Normally, food travels down the esophagus and stays in the stomach to be digested. If a person has gastroesophageal reflux disease (GERD), food and stomach acid move back up into the esophagus. When this happens, the esophagus becomes sore and swollen (inflamed). Over time, GERD can make small holes (ulcers) in the lining of the esophagus. Follow these instructions at home: Diet  Follow a diet as told by your doctor. You may need to avoid foods and drinks such as: ? Coffee and tea (with or without caffeine). ? Drinks that contain alcohol. ? Energy drinks and sports drinks. ? Carbonated drinks or sodas. ? Chocolate and cocoa. ? Peppermint and mint flavorings. ? Garlic and onions. ? Horseradish. ? Spicy and acidic foods, such as peppers, chili powder, curry powder, vinegar, hot sauces, and BBQ sauce. ? Citrus fruit juices and citrus fruits, such as oranges, lemons, and limes. ? Tomato-based foods, such as red sauce, chili, salsa, and pizza with red sauce. ? Fried and fatty foods, such as donuts, french fries, potato chips, and high-fat dressings. ? High-fat meats, such as hot dogs, rib eye steak, sausage, ham, and bacon. ? High-fat dairy items, such as whole milk, butter, and cream cheese.  Eat small meals often. Avoid eating large meals.  Avoid drinking large amounts of liquid with your meals.  Avoid eating meals during the 2-3 hours before bedtime.  Avoid lying down right after you eat.  Do not  exercise right after you eat. General instructions  Pay attention to any changes in your symptoms.  Take over-the-counter and prescription medicines only as told by your doctor. Do not take aspirin, ibuprofen, or other NSAIDs unless your doctor says it is okay.  Do not use any tobacco products, including cigarettes, chewing tobacco, and e-cigarettes. If you need help  quitting, ask your doctor.  Wear loose clothes. Do not wear anything tight around your waist.  Raise (elevate) the head of your bed about 6 inches (15 cm).  Try to lower your stress. If you need help doing this, ask your doctor.  If you are overweight, lose an amount of weight that is healthy for you. Ask your doctor about a safe weight loss goal.  Keep all follow-up visits as told by your doctor. This is important. Contact a doctor if:  You have new symptoms.  You lose weight and you do not know why it is happening.  You have trouble swallowing, or it hurts to swallow.  You have wheezing or a cough that keeps happening.  Your symptoms do not get better with treatment.  You have a hoarse voice. Get help right away if:  You have pain in your arms, neck, jaw, teeth, or back.  You feel sweaty, dizzy, or light-headed.  You have chest pain or shortness of breath.  You throw up (vomit) and your throw up looks like blood or coffee grounds.  You pass out (faint).  Your poop (stool) is bloody or black.  You cannot swallow, drink, or eat. This information is not intended to replace advice given to you by your health care provider. Make sure you discuss any questions you have with your health care provider. Document Released: 12/09/2007 Document Revised: 11/28/2015 Document Reviewed: 10/17/2014 Elsevier Interactive Patient Education  2018 [place] Esophagus Barrett esophagus occurs when the tissue that lines the esophagus changes or becomes damaged. The esophagus is the tube that carries food from the throat to the stomach. With Barrett esophagus, the cells that line the esophagus are replaced by cells that are similar to the lining of the intestines (intestinal metaplasia). Barrett esophagus itself may not cause any symptoms. However, many people who have Barrett esophagus also have gastroesophageal reflux disease (GERD), which may cause symptoms such as  heartburn. Treatment may include medicines, procedures to destroy the abnormal cells, or surgery. Over time, a few people with this condition may develop cancer of the esophagus. What are the causes? The exact cause of this condition is not known. In some cases, the condition develops from damage to the lining of the esophagus caused by GERD. GERD occurs when stomach acids flow up from the stomach into the esophagus. Frequent symptoms of GERD may cause intestinal metaplasia or cause cell changes (dysplasia). What increases the risk? The following factors may make you more likely to develop this condition:  Having GERD.  Being any of the following: ? Male. ? White (Caucasian). ? Obese. ? Older than 50.  Having a hiatal hernia.  Smoking.  What are the signs or symptoms? People with Barrett esophagus often have no symptoms. However, many people with this condition also have GERD. Symptoms of GERD may include:  Heartburn.  Difficulty swallowing.  Dry cough.  How is this diagnosed? Barrett esophagus may be diagnosed with an exam called an upper gastrointestinal endoscopy. During this exam, a thin, flexible tube (endoscope) is passed down your esophagus. The endoscope has a light and camera on  the end of it. Your health care provider uses the endoscope to view the inside of your esophagus. During the exam, several tissue samples will be removed (biopsy) from your esophagus so they can be checked for intestinal metaplasia or dysplasia. How is this treated? Treatment for this condition may include:  Medicines (proton pump inhibitors, or PPIs) to decrease or stop GERD.  Periodic endoscopic exams to make sure that cancer is not developing.  A procedure or surgery for dysplasia. This may include: ? Endoscopic removal or destruction of abnormal cells. ? Removal of part of the esophagus (esophagectomy).  Follow these instructions at home: Eating and drinking  Eat more fruits and  vegetables.  Avoid fatty foods.  Eat small, frequent meals instead of large meals.  Avoid foods that cause heartburn. These foods include: ? Coffee and alcoholic drinks. ? Tomatoes and foods made with tomatoes. ? Greasy or spicy foods. ? Chocolate and peppermint. General instructions   Take over-the-counter and prescription medicines only as told by your health care provider.  Do not use any tobacco products, such as cigarettes, chewing tobacco, and e-cigarettes. If you need help quitting, ask your health care provider.  If your health care provider is treating you for GERD, make sure you follow all instructions and take medicines as directed.  Keep all follow-up visits as told by your health care provider. This is important. Contact a health care provider if:  You have heartburn or GERD symptoms.  You have difficulty swallowing. Get help right away if:  You have chest pain.  You are unable to swallow.  You vomit blood or material that looks like coffee grounds.  Your stool (feces) is bright red or dark. This information is not intended to replace advice given to you by your health care provider. Make sure you discuss any questions you have with your health care provider. Document Released: 09/12/2003 Document Revised: 11/28/2015 Document Reviewed: 04/04/2015 Elsevier Interactive Patient Education  [employer].

## 2018-04-15 ENCOUNTER — Encounter: Payer: Self-pay | Admitting: Internal Medicine

## 2018-04-20 ENCOUNTER — Encounter (HOSPITAL_COMMUNITY): Payer: Self-pay | Admitting: Internal Medicine

## 2018-04-29 ENCOUNTER — Ambulatory Visit (INDEPENDENT_AMBULATORY_CARE_PROVIDER_SITE_OTHER): Payer: Medicare Other | Admitting: "Endocrinology

## 2018-04-29 ENCOUNTER — Encounter: Payer: Self-pay | Admitting: "Endocrinology

## 2018-04-29 VITALS — BP 144/80 | HR 89 | Ht 70.0 in | Wt 253.0 lb

## 2018-04-29 DIAGNOSIS — E890 Postprocedural hypothyroidism: Secondary | ICD-10-CM | POA: Diagnosis not present

## 2018-04-29 MED ORDER — LEVOTHYROXINE SODIUM 150 MCG PO TABS: 150.0000 ug | ORAL_TABLET | Freq: Every day | ORAL | 6 refills | Status: DC

## 2018-04-29 NOTE — Progress Notes (Signed)
Endocrinology follow-up note   Subjective:    Patient ID: [name], male    DOB: January 01, 1941, PCP Michell Heinrich, DO   Past Medical History:  Diagnosis Date  . Arthritis    "fingers; knees; toes" (01/05/2014)  . Barrett's esophagus   . BPH (benign prostatic hyperplasia)   . Bradycardia 2009   Sinus rhythm in the 40s without symptoms;  Marland Kitchen GERD (gastroesophageal reflux disease)    ulcerative/erosive  . Gout   . Hemorrhoids   . Hiatal hernia   . History of skin cancer   . Hx of adenomatous colonic polyps   . Hyperlipidemia   . Hypertension   . Hypothyroidism    Following partial thyroidectomy  . Squamous carcinoma    skin cancer benign arms and right ear and rightside lip  . Tobacco abuse, in remission    10-20 pack years; discontinued in 1981   Past Surgical History:  Procedure Laterality Date  . ABDOMINAL HERNIA REPAIR  1990's  . BIOPSY  04/13/2018   Procedure: BIOPSY;  Surgeon: Daneil Dolin, MD;  Location: [facility];  Service: Endoscopy;;  . CATARACT EXTRACTION W/ INTRAOCULAR LENS  IMPLANT, BILATERAL Bilateral 2012-2014  . COLONOSCOPY  12/2005   normal  . COLONOSCOPY  2004   tubulovillous adenoma of rectum   . COLONOSCOPY  01/12/2011   Dr. Gala Romney- normal rectum  . COLONOSCOPY N/A 07/09/2015   Procedure: COLONOSCOPY;  Surgeon: Daneil Dolin, MD;  Location: [facility];  Service: Endoscopy;  Laterality: N/A;  730   . ELBOW ARTHROSCOPY Left 2014  . ESOPHAGEAL DILATION N/A 02/13/2015   Procedure: ESOPHAGEAL DILATION;  Surgeon: Daneil Dolin, MD;  [facility];  Service: Endoscopy;  Laterality: N/A;  . ESOPHAGOGASTRODUODENOSCOPY  01/2006   geographic erosive RE, noncritical peptic stricture s/p dilation, antral erosion  . ESOPHAGOGASTRODUODENOSCOPY  01/12/2011   Dr. Gala Romney- hiatal hernia, distal esophageal erosions, barretts esophagus  . ESOPHAGOGASTRODUODENOSCOPY  01/27/2012   Dr.Rourk- short segment barretts esophagus, erosive reflux esophagitis. bx=  barretts esophagus, negative for dysplasia  . ESOPHAGOGASTRODUODENOSCOPY N/A 02/13/2015   Dr.Rourk- ulcerative/erosive reflux esophagitis, abnormal esophagitis c/w prior diagnosis of barretts esophagus, hiatal hernia. bx=barretts esophagus  . ESOPHAGOGASTRODUODENOSCOPY N/A 04/13/2018   Procedure: ESOPHAGOGASTRODUODENOSCOPY (EGD);  Surgeon: Daneil Dolin, MD;  [facility];  Service: Endoscopy;  Laterality: N/A;  2:15pm  . HEMORRHOID SURGERY N/A 07/12/2015   Procedure: SIMPLE HEMORRHOIDECTOMY;  Surgeon: Aviva Signs, MD;  Location: [facility];  Service: General;  Laterality: N/A;  . [place]  01/12/2011   Procedure: [name] DILATION;  Surgeon: Daneil Dolin, MD;  [facility]: [facility];  Service: Endoscopy;  Laterality: N/A;  56 french  . MALONEY DILATION N/A 04/13/2018   Procedure: [name] DILATION;  Surgeon: Daneil Dolin, MD;  [facility];  Service: Endoscopy;  Laterality: N/A;  . SQUAMOUS CELL CARCINOMA EXCISION     "cut off my hands; left arm; under right lip; right ear" (01/05/2014)  . THYROIDECTOMY, PARTIAL Right 1969   lobe-nodule present  . TOTAL KNEE ARTHROPLASTY Left 01/05/2014   Procedure: LEFT TOTAL KNEE ARTHROPLASTY;  Surgeon: Mcarthur Rossetti, MD;  Location: [facility];  Service: Orthopedics;  Laterality: Left;  . TOTAL KNEE ARTHROPLASTY Right 05/04/2014   Procedure: RIGHT TOTAL KNEE ARTHROPLASTY;  Surgeon: Mcarthur Rossetti, MD;  Location: [facility];  Service: Orthopedics;  Laterality: Right;  . [place]  2000   Social  History   Socioeconomic History  . Marital status: Married    Spouse name: Not on file  . Number of children: 2  . Years of education: Not on file  . Highest education level: Not on file  Occupational History  . Occupation: [employer]    Comment: Current employment  . Occupation: Scientist, research (life sciences): Prior employment  Social Needs  . Financial resource strain: Not on file  .  Food insecurity:    Worry: Not on file    Inability: Not on file  . Transportation needs:    Medical: Not on file    Non-medical: Not on file  Tobacco Use  . Smoking status: Former Smoker    Packs/day: 1.00    Years: 20.00    Pack years: 20.00    Types: Cigarettes    Start date: 07/06/1956    Last attempt to quit: 07/06/1980    Years since quitting: 37.8  . Smokeless tobacco: Never Used  . Tobacco comment: "quit smoking in ~ 1982"  Substance and Sexual Activity  . Alcohol use: Yes    Alcohol/week: 1.0 standard drinks    Types: 1 Cans of beer per week    Comment: rare  . Drug use: No  . Sexual activity: Not on file  Lifestyle  . Physical activity:    Days per week: Not on file    Minutes per session: Not on file  . Stress: Not on file  Relationships  . Social connections:    Talks on phone: Not on file    Gets together: Not on file    Attends religious service: Not on file    Active member of club or organization: Not on file    Attends meetings of clubs or organizations: Not on file    Relationship status: Not on file  Other Topics Concern  . Not on file  Social History Narrative  . Not on file   Outpatient Encounter Medications as of 04/29/2018  Medication Sig  . amLODipine (NORVASC) 10 MG tablet Take 10 mg by mouth every morning.   Marland Kitchen atorvastatin (LIPITOR) 40 MG tablet Take 1 tablet (40 mg total) by mouth daily.  . chlorthalidone (HYGROTON) 25 MG tablet Take 25 mg by mouth daily.  . hydrALAZINE (APRESOLINE) 25 MG tablet Take 1 tablet (25 mg total) 2 (two) times daily by mouth.  . levothyroxine (SYNTHROID, LEVOTHROID) 150 MCG tablet Take 1 tablet (150 mcg total) by mouth daily before breakfast.  . lisinopril (PRINIVIL,ZESTRIL) 40 MG tablet Take 1 tablet by mouth daily.  Marland Kitchen omeprazole (PRILOSEC) 40 MG capsule Take 1 capsule (40 mg total) by mouth daily.  . tamsulosin (FLOMAX) 0.4 MG CAPS capsule Take 1 capsule by mouth daily.  . [DISCONTINUED] levothyroxine (SYNTHROID,  LEVOTHROID) 175 MCG tablet Take 1 tablet (175 mcg total) by mouth daily before breakfast.   No facility-administered encounter medications on file as of 04/29/2018.    ALLERGIES: No Known Allergies  VACCINATION STATUS:  There is no immunization history on file for this patient.  HPI 77 year old male patient with medical history as above. He is being seen in follow-up for postsurgical hypothyroidism.  He underwent what appears to be partial thyroidectomy for benign pathology in 1969. He was treated with various doses of levothyroxine over the years, currently on levothyroxine 175 g by mouth every morning. He reports compliance to this medication. He  takes it orally in the morning with water.  - He now reports that 1  of his daughters is diagnosed with some thyroid dysfunction.  He denies family history of thyroid cancer.  -He scribes anxiety, palpitations, and feeling hot.   -His previsit labs show above target thyroid hormone levels and suppressed TSH..   -He has a steady weight. - He denies exposure to neck radiation. -He denies dysphagia, shortness of breath, change in his voice.    Review of Systems  Constitutional: + Overweight/obese most of his adult life, - fatigue, no subjective hyperthermia, no subjective hypothermia Eyes: no blurry vision, no xerophthalmia ENT: no sore throat, no nodules palpated in throat, no dysphagia/odynophagia, no hoarseness Cardiovascular: no Chest Pain, no Shortness of Breath, no palpitations, no leg swelling Musculoskeletal: no muscle/joint aches Skin: no rashes Neurological: No tremors, no numbness, no tingling.   Psychiatric: no depression, no anxiety  Objective:    BP (!) 144/80   Pulse 89   Ht 5\' 10"  (1.778 m)   Wt 253 lb (114.8 kg)   BMI 36.30 kg/m   Wt Readings from Last 3 Encounters:  04/29/18 253 lb (114.8 kg)  03/23/18 254 lb (115.2 kg)  09/09/17 254 lb (115.2 kg)    Physical Exam  Constitutional: + obese,  not in acute  distress, normal state of mind Eyes: PERRLA, EOMI, no exophthalmos ENT: moist mucous membranes, post thyroidectomy scar on his anterior lower neck, no thyromegaly, no cervical lymphadenopathy.  Cardiovascular: normal precordial activity, Regular Rate and Rhythm, no Murmur/Rubs/Gallops Respiratory:  adequate breathing efforts, no gross chest deformity, Clear to auscultation bilaterally Gastrointestinal: abdomen soft, Non -tender, No distension, Bowel Sounds present Musculoskeletal: no gross deformities, strength intact in all four extremities Skin: moist, warm, no rashes Neurological: + tremor with outstretched hands, Deep tendon reflexes normal in all four extremities.  CMP ( most recent) CMP     Component Value Date/Time   NA 141 07/10/2015 1100   K 4.1 07/10/2015 1100   CL 105 07/10/2015 1100   CO2 28 07/10/2015 1100   GLUCOSE 100 (H) 07/10/2015 1100   BUN 17 07/10/2015 1100   CREATININE 1.21 07/10/2015 1100   CREATININE 1.14 08/31/2012 1149   CALCIUM 9.8 07/10/2015 1100   PROT 6.8 08/31/2012 1149   ALBUMIN 4.5 08/31/2012 1149   AST 19 08/31/2012 1149   ALT 22 08/31/2012 1149   ALKPHOS 55 08/31/2012 1149   BILITOT 0.6 08/31/2012 1149   GFRNONAA 57 (L) 07/10/2015 1100   GFRAA >60 07/10/2015 1100     Lipid Panel ( most recent) Lipid Panel     Component Value Date/Time   CHOL 130 04/07/2018   TRIG 175 (A) 04/07/2018   HDL 30 (A) 04/07/2018   CHOLHDL 6.4 08/31/2012 1149   VLDL 52 (H) 08/31/2012 1149   LDLCALC 73 04/07/2018    Labs from April 07, 2018: Total T4 14.2 high, free T4 4.4 high, TSH 0.22 suppressed Labs from 09/28/2016 showed TSH 5.39 elevated, free T3 3 0.3, total T4 11.3. 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  Assessment & Plan:   1. Postsurgical hypothyroidism -His recent thyroid function tests are consistent with over replacement.    -I discussed and lowered his  levothyroxine to 150 mcg p.o. before breakfast.    - We discussed about correct intake of levothyroxine, at fasting, with water, separated by at least 30 minutes from breakfast, and separated by more than 4 hours from calcium, iron, multivitamins, acid reflux medications (PPIs). -Patient is made aware of  the fact that thyroid hormone replacement is needed for life, dose to be adjusted by periodic monitoring of thyroid function tests.  2. History of goiter status post partial thyroidectomy: - His repeat thyroid/neck ultrasound from 03/09/2017 is remarkable for surgical absent right lobe, 2.9 cm remnant left lobe with no discrete nodules or lesions. He will not need any intervention.  - I advised patient to maintain close follow up with Michell Heinrich, DO for primary care needs. Follow up plan: Return in about 6 months (around 10/29/2018) for Follow up with Pre-visit Labs.  Glade Lloyd, MD Phone: [phone]  Fax: [phone]   This note was partially dictated with voice recognition software. Similar sounding words can be transcribed inadequately or may not  be corrected upon review. 04/29/2018, 3:12 PM

## 2018-07-25 ENCOUNTER — Encounter: Payer: Self-pay | Admitting: Nurse Practitioner

## 2018-07-25 ENCOUNTER — Other Ambulatory Visit: Payer: Self-pay

## 2018-07-25 ENCOUNTER — Ambulatory Visit (INDEPENDENT_AMBULATORY_CARE_PROVIDER_SITE_OTHER): Payer: Medicare Other | Admitting: Nurse Practitioner

## 2018-07-25 VITALS — BP 173/90 | HR 78 | Temp 96.9°F | Ht 70.0 in | Wt 260.0 lb

## 2018-07-25 DIAGNOSIS — D126 Benign neoplasm of colon, unspecified: Secondary | ICD-10-CM | POA: Diagnosis not present

## 2018-07-25 DIAGNOSIS — Z86010 Personal history of colonic polyps: Secondary | ICD-10-CM

## 2018-07-25 DIAGNOSIS — K2270 Barrett's esophagus without dysplasia: Secondary | ICD-10-CM | POA: Diagnosis not present

## 2018-07-25 DIAGNOSIS — K2100 Gastro-esophageal reflux disease with esophagitis, without bleeding: Secondary | ICD-10-CM

## 2018-07-25 MED ORDER — OMEPRAZOLE 40 MG PO CPDR: 40.0000 mg | DELAYED_RELEASE_CAPSULE | Freq: Every day | ORAL | 3 refills | Status: DC

## 2018-07-25 MED ORDER — NA SULFATE-K SULFATE-MG SULF 17.5-3.13-1.6 GM/177ML PO SOLN: 1.0000 | ORAL | 0 refills | Status: DC

## 2018-07-25 NOTE — Patient Instructions (Signed)
Your health issues we discussed today were:   Barrett's esophagus: 1. We do not recommend repeating your upper endoscopy unless she develops symptoms.  Notify us if you do. 2. Continue taking Prilosec indefinitely. 3. I have sent a refill of Prilosec to your pharmacy.  History of colon polyps: 1. You do have a history of frequent colon polyps which is why we are repeating your colonoscopy only 3 years after your last one. 2. We will schedule your colonoscopy for you. 3. Continue your current medications.  Heartburn (GERD)/"reflux esophagitis": 1. Continue taking Prilosec 2. Call us if you have any worsening symptoms. 3. I have sent a refill to your pharmacy of Prilosec.  Overall I recommend:  1. Return for follow-up in 1 year. 2. Call us if you have any questions or concerns.  At [facility] we value your feedback. You may receive a survey about your visit today. Please share your experience as we strive to create trusting relationships with our patients to provide genuine, compassionate, quality care.  We appreciate your understanding and patience as we review any laboratory studies, imaging, and other diagnostic tests that are ordered as we care for you. Our office policy is 5 business days for review of these results, and any emergent or urgent results are addressed in a timely manner for your best interest. If you do not hear from our office in 1 week, please contact us.   We also encourage the use of MyChart, which contains your medical information for your review as well. If you are not enrolled in this feature, an access code is on this after visit summary for your convenience. Thank you for allowing [place] to be involved in your care.  It was great to see you today!  I hope you have a great day!!

## 2018-07-25 NOTE — Assessment & Plan Note (Signed)
Previous reflux esophagitis and GERD symptoms which are currently very well controlled on his PPI.  Recommend he continue this indefinitely due to history of Barrett's esophagus as per below.  I will refill his PPI today.  Follow-up in 1 year.

## 2018-07-25 NOTE — Assessment & Plan Note (Signed)
History of Barrett's esophagus with recent EGD.  Barrett's remains.  Given his age recommended no further EGD unless symptoms develop.  I did recommend he continue on his PPI indefinitely.  He is asking for refill and I will send this in.  Follow-up in 1 year, call if any worsening symptoms or questions.

## 2018-07-25 NOTE — Assessment & Plan Note (Signed)
History of adenomatous colon polyps.  Last colonoscopy completed 3 years ago and recommended to 3-year repeat.  He is currently due but not overdue.  At this point we discussed repeating his colonoscopy and he is in agreement.  We will proceed with colonoscopy scheduling.  Continue other medications and follow-up in 1 year.

## 2018-07-25 NOTE — Progress Notes (Signed)
CC'D TO PCP °

## 2018-07-25 NOTE — Progress Notes (Signed)
Referring Provider: Michell Heinrich, DO Primary Care Physician:  Michell Heinrich, DO Primary GI:  Dr. Gala Romney  Chief Complaint  Patient presents with  . Gastroesophageal Reflux    Barrett's. Seems to do okay  . Consult    TCS. Due for 3 yr    HPI:   [name] is a 78 y.o. male who presents for follow-up on Barrett's esophagus and to schedule repeat colonoscopy.  The patient was last seen in our office 03/23/2018 for Barrett's esophagus, GERD, dysphasia.  Known history of Barrett's esophagus.  History of reflux esophagitis and stricture.  History of tubulovillous adenoma.  Last colonoscopy January 2017 with multiple colon polyps and recommended repeat in January 2020.  At his last visit he was doing okay, some solid food dysphasia primarily with meats.  Significant burning with soda consumption.  No other GI symptoms.  Recommend EGD with possible dilation, continue omeprazole indefinitely due to Barrett's esophagus, follow-up in 4 months to schedule colonoscopy.  EGD was completed 04/13/2018 which found consistent with diagnosis of Barrett's esophagus status post biopsy, Maloney dilation, small hiatal hernia, otherwise normal.  Surgical pathology found the biopsies to be consistent with Barrett's esophagus.  Recommended no future endoscopy unless new symptoms develop.  Today he states he's doing ok overall. EGD went well. Still taking PPI. After his last EGD her developed a hemorrhoid which he had surgically removed, patient states "I don't know what they did, but they fixed it." Passing a lot of gas. Rare generalized mild abdominal pain. Occasional constipation, uses colace which helps. Denies N/V, hematochezia, melena, fever, chills, unintentional weight loss. Denies chest pain, dyspnea, dizziness, lightheadedness, syncope, near syncope. Denies any other upper or lower GI symptoms.  Past Medical History:  Diagnosis Date  . Arthritis    "fingers; knees; toes" (01/05/2014)  . Barrett's  esophagus   . BPH (benign prostatic hyperplasia)   . Bradycardia 2009   Sinus rhythm in the 40s without symptoms;  Marland Kitchen GERD (gastroesophageal reflux disease)    ulcerative/erosive  . Gout   . Hemorrhoids   . Hiatal hernia   . History of skin cancer   . Hx of adenomatous colonic polyps   . Hyperlipidemia   . Hypertension   . Hypothyroidism    Following partial thyroidectomy  . Squamous carcinoma    skin cancer benign arms and right ear and rightside lip  . Tobacco abuse, in remission    10-20 pack years; discontinued in 1981    Past Surgical History:  Procedure Laterality Date  . ABDOMINAL HERNIA REPAIR  1990's  . BIOPSY  04/13/2018   Procedure: BIOPSY;  Surgeon: Daneil Dolin, MD;  [facility];  Service: Endoscopy;;  . CATARACT EXTRACTION W/ INTRAOCULAR LENS  IMPLANT, BILATERAL Bilateral 2012-2014  . COLONOSCOPY  12/2005   normal  . COLONOSCOPY  2004   tubulovillous adenoma of rectum   . COLONOSCOPY  01/12/2011   Dr. Gala Romney- normal rectum  . COLONOSCOPY N/A 07/09/2015   Procedure: COLONOSCOPY;  Surgeon: Daneil Dolin, MD;  [facility];  Service: Endoscopy;  Laterality: N/A;  730   . ELBOW ARTHROSCOPY Left 2014  . ESOPHAGEAL DILATION N/A 02/13/2015   Procedure: ESOPHAGEAL DILATION;  Surgeon: Daneil Dolin, MD;  [facility];  Service: Endoscopy;  Laterality: N/A;  . ESOPHAGOGASTRODUODENOSCOPY  01/2006   geographic erosive RE, noncritical peptic stricture s/p dilation, antral erosion  . ESOPHAGOGASTRODUODENOSCOPY  01/12/2011   Dr. Gala Romney- hiatal hernia, distal  esophageal erosions, barretts esophagus  . ESOPHAGOGASTRODUODENOSCOPY  01/27/2012   Dr.Rourk- short segment barretts esophagus, erosive reflux esophagitis. bx= barretts esophagus, negative for dysplasia  . ESOPHAGOGASTRODUODENOSCOPY N/A 02/13/2015   Dr.Rourk- ulcerative/erosive reflux esophagitis, abnormal esophagitis c/w prior diagnosis of barretts esophagus, hiatal hernia. bx=barretts  esophagus  . ESOPHAGOGASTRODUODENOSCOPY N/A 04/13/2018   Procedure: ESOPHAGOGASTRODUODENOSCOPY (EGD);  Surgeon: Daneil Dolin, MD;  [facility];  Service: Endoscopy;  Laterality: N/A;  2:15pm  . HEMORRHOID SURGERY N/A 07/12/2015   Procedure: SIMPLE HEMORRHOIDECTOMY;  Surgeon: Aviva Signs, MD;  Location: [facility];  Service: General;  Laterality: N/A;  . [place]  01/12/2011   Procedure: [name] DILATION;  Surgeon: Daneil Dolin, MD;  [facility];  Service: Endoscopy;  Laterality: N/A;  56 french  . MALONEY DILATION N/A 04/13/2018   Procedure: [name] DILATION;  Surgeon: Daneil Dolin, MD;  [facility];  Service: Endoscopy;  Laterality: N/A;  . SQUAMOUS CELL CARCINOMA EXCISION     "cut off my hands; left arm; under right lip; right ear" (01/05/2014)  . THYROIDECTOMY, PARTIAL Right 1969   lobe-nodule present  . TOTAL KNEE ARTHROPLASTY Left 01/05/2014   Procedure: LEFT TOTAL KNEE ARTHROPLASTY;  Surgeon: Mcarthur Rossetti, MD;  Location: [place];  Service: Orthopedics;  Laterality: Left;  . TOTAL KNEE ARTHROPLASTY Right 05/04/2014   Procedure: RIGHT TOTAL KNEE ARTHROPLASTY;  Surgeon: Mcarthur Rossetti, MD;  Location: [facility];  Service: Orthopedics;  Laterality: Right;  . UMBILICAL HERNIA REPAIR  2000    Current Outpatient Medications  Medication Sig Dispense Refill  . amLODipine (NORVASC) 10 MG tablet Take 10 mg by mouth every morning.     . hydrALAZINE (APRESOLINE) 25 MG tablet Take 1 tablet (25 mg total) 2 (two) times daily by mouth. (Patient taking differently: Take 25 mg by mouth daily. ) 60 tablet 6  . levothyroxine (SYNTHROID, LEVOTHROID) 150 MCG tablet Take 1 tablet (150 mcg total) by mouth daily before breakfast. 30 tablet 6  . lisinopril (PRINIVIL,ZESTRIL) 40 MG tablet Take 1 tablet by mouth daily.    Marland Kitchen omeprazole (PRILOSEC) 40 MG capsule Take 1 capsule (40 mg total) by mouth daily. (Patient taking differently: Take 40 mg  by mouth as needed. ) 30 capsule 2  . tamsulosin (FLOMAX) 0.4 MG CAPS capsule Take 1 capsule by mouth daily.    Marland Kitchen atorvastatin (LIPITOR) 40 MG tablet Take 1 tablet (40 mg total) by mouth daily. (Patient not taking: Reported on 07/25/2018) 30 tablet 6   No current facility-administered medications for this visit.     Allergies as of 07/25/2018  . (No Known Allergies)    Family History  Problem Relation Age of Onset  . Lung cancer Father 24  . Colon polyps Brother   . Colon cancer Neg Hx   . Liver disease Neg Hx     Social History   Socioeconomic History  . Marital status: Married    Spouse name: Not on file  . Number of children: 2  . Years of education: Not on file  . Highest education level: Not on file  Occupational History  . Occupation: [employer]    Comment: Current employment  . Occupation: Scientist, research (life sciences): Prior employment  Social Needs  . Financial resource strain: Not on file  . Food insecurity:    Worry: Not on file    Inability: Not on file  . Transportation needs:  Medical: Not on file    Non-medical: Not on file  Tobacco Use  . Smoking status: Former Smoker    Packs/day: 1.00    Years: 20.00    Pack years: 20.00    Types: Cigarettes    Start date: 07/06/1956    Last attempt to quit: 07/06/1980    Years since quitting: 38.0  . Smokeless tobacco: Never Used  . Tobacco comment: "quit smoking in ~ 1982"  Substance and Sexual Activity  . Alcohol use: Yes    Alcohol/week: 1.0 standard drinks    Types: 1 Cans of beer per week    Comment: rare  . Drug use: No  . Sexual activity: Not on file  Lifestyle  . Physical activity:    Days per week: Not on file    Minutes per session: Not on file  . Stress: Not on file  Relationships  . Social connections:    Talks on phone: Not on file    Gets together: Not on file    Attends religious service: Not on file    Active member of club or organization: Not on file    Attends meetings of  clubs or organizations: Not on file    Relationship status: Not on file  Other Topics Concern  . Not on file  Social History Narrative  . Not on file    Review of Systems: General: Negative for anorexia, weight loss, fever, chills, fatigue, weakness. ENT: Negative for hoarseness, difficulty swallowing. CV: Negative for chest pain, angina, palpitations, peripheral edema.  Respiratory: Negative for dyspnea at rest, cough, sputum, wheezing.  GI: See history of present illness. Endo: Negative for unusual weight change.  Heme: Negative for bruising or bleeding. Allergy: Negative for rash or hives.   Physical Exam: BP (!) 173/90   Pulse 78   Temp (!) 96.9 F (36.1 C) (Oral)   Ht 5\' 10"  (1.778 m)   Wt 260 lb (117.9 kg)   BMI 37.31 kg/m  General:   Alert and oriented. Pleasant and cooperative. Well-nourished and well-developed.  Eyes:  Without icterus, sclera clear and conjunctiva pink.  Ears:  Normal auditory acuity. Cardiovascular:  S1, S2 present without murmurs appreciated. Extremities without clubbing or edema. Respiratory:  Clear to auscultation bilaterally. No wheezes, rales, or rhonchi. No distress.  Gastrointestinal:  +BS, soft, non-tender and non-distended. No HSM noted. No guarding or rebound. No masses appreciated.  Rectal:  Deferred  Musculoskalatal:  Symmetrical without gross deformities. Neurologic:  Alert and oriented x4;  grossly normal neurologically. Psych:  Alert and cooperative. Normal mood and affect. Heme/Lymph/Immune: No excessive bruising noted.    07/25/2018 10:42 AM   Disclaimer: This note was dictated with voice recognition software. Similar sounding words can inadvertently be transcribed and may not be corrected upon review.

## 2018-09-14 ENCOUNTER — Telehealth: Payer: Self-pay | Admitting: *Deleted

## 2018-09-14 ENCOUNTER — Ambulatory Visit: Payer: Medicare Other | Admitting: "Endocrinology

## 2018-09-14 MED ORDER — PEG 3350-KCL-NA BICARB-NACL 420 G PO SOLR: 4000.0000 mL | Freq: Once | ORAL | 0 refills | Status: AC

## 2018-09-14 NOTE — Telephone Encounter (Signed)
Patient stating prep is too expensive and needs something different called in. Trylite sent in and instructions mailed.

## 2018-09-26 ENCOUNTER — Encounter: Payer: Self-pay | Admitting: *Deleted

## 2018-09-26 ENCOUNTER — Telehealth: Payer: Self-pay | Admitting: *Deleted

## 2018-09-26 NOTE — Telephone Encounter (Signed)
Spoke with patient and he is scheduled for 5/27 at 12pm. New instructions mailed.

## 2018-10-20 LAB — T4, FREE: Free T4: 1.5 ng/dL (ref 0.8–1.8)

## 2018-10-20 LAB — TSH: TSH: 4.49 mIU/L (ref 0.40–4.50)

## 2018-10-27 ENCOUNTER — Encounter: Payer: Self-pay | Admitting: "Endocrinology

## 2018-10-28 ENCOUNTER — Encounter: Payer: Self-pay | Admitting: "Endocrinology

## 2018-10-28 ENCOUNTER — Other Ambulatory Visit: Payer: Self-pay

## 2018-10-28 ENCOUNTER — Ambulatory Visit (INDEPENDENT_AMBULATORY_CARE_PROVIDER_SITE_OTHER): Payer: Medicare Other | Admitting: "Endocrinology

## 2018-10-28 DIAGNOSIS — E890 Postprocedural hypothyroidism: Secondary | ICD-10-CM | POA: Diagnosis not present

## 2018-10-28 MED ORDER — LEVOTHYROXINE SODIUM 150 MCG PO TABS: 150.0000 ug | ORAL_TABLET | Freq: Every day | ORAL | 1 refills | Status: DC

## 2018-10-28 NOTE — Progress Notes (Signed)
.                                                   Endocrinology Telehealth Visit Follow up Note -During COVID -19 Pandemic   Subjective:    Patient ID: [name], male    DOB: 30-Jun-1941, PCP Michell Heinrich, DO   Past Medical History:  Diagnosis Date  . Arthritis    "fingers; knees; toes" (01/05/2014)  . Barrett's esophagus   . BPH (benign prostatic hyperplasia)   . Bradycardia 2009   Sinus rhythm in the 40s without symptoms;  Marland Kitchen GERD (gastroesophageal reflux disease)    ulcerative/erosive  . Gout   . Hemorrhoids   . Hiatal hernia   . History of skin cancer   . Hx of adenomatous colonic polyps   . Hyperlipidemia   . Hypertension   . Hypothyroidism    Following partial thyroidectomy  . Squamous carcinoma    skin cancer benign arms and right ear and rightside lip  . Tobacco abuse, in remission    10-20 pack years; discontinued in 1981   Past Surgical History:  Procedure Laterality Date  . ABDOMINAL HERNIA REPAIR  1990's  . BIOPSY  04/13/2018   Procedure: BIOPSY;  Surgeon: Daneil Dolin, MD;  [facility];  Service: Endoscopy;;  . CATARACT EXTRACTION W/ INTRAOCULAR LENS  IMPLANT, BILATERAL Bilateral 2012-2014  . COLONOSCOPY  12/2005   normal  . COLONOSCOPY  2004   tubulovillous adenoma of rectum   . COLONOSCOPY  01/12/2011   Dr. Gala Romney- normal rectum  . COLONOSCOPY N/A 07/09/2015   Procedure: COLONOSCOPY;  Surgeon: Daneil Dolin, MD;  [facility];  Service: Endoscopy;  Laterality: N/A;  730   . ELBOW ARTHROSCOPY Left 2014  . ESOPHAGEAL DILATION N/A 02/13/2015   Procedure: ESOPHAGEAL DILATION;  Surgeon: Daneil Dolin, MD;  [facility];  Service: Endoscopy;  Laterality: N/A;  . ESOPHAGOGASTRODUODENOSCOPY  01/2006   geographic erosive RE, noncritical peptic stricture s/p dilation, antral erosion  . ESOPHAGOGASTRODUODENOSCOPY  01/12/2011   Dr. Gala Romney- hiatal hernia, distal esophageal erosions, barretts esophagus  .  ESOPHAGOGASTRODUODENOSCOPY  01/27/2012   Dr.Rourk- short segment barretts esophagus, erosive reflux esophagitis. bx= barretts esophagus, negative for dysplasia  . ESOPHAGOGASTRODUODENOSCOPY N/A 02/13/2015   Dr.Rourk- ulcerative/erosive reflux esophagitis, abnormal esophagitis c/w prior diagnosis of barretts esophagus, hiatal hernia. bx=barretts esophagus  . ESOPHAGOGASTRODUODENOSCOPY N/A 04/13/2018   Procedure: ESOPHAGOGASTRODUODENOSCOPY (EGD);  Surgeon: Daneil Dolin, MD;  [facility];  Service: Endoscopy;  Laterality: N/A;  2:15pm  . HEMORRHOID SURGERY N/A 07/12/2015   Procedure: SIMPLE HEMORRHOIDECTOMY;  Surgeon: Aviva Signs, MD;  Location: [facility];  Service: General;  Laterality: N/A;  . [place]  01/12/2011   Procedure: [name] DILATION;  Surgeon: Daneil Dolin, MD;  [facility];  Service: Endoscopy;  Laterality: N/A;  56 french  . MALONEY DILATION N/A 04/13/2018   Procedure: [name] DILATION;  Surgeon: Daneil Dolin, MD;  [facility];  Service: Endoscopy;  Laterality: N/A;  . SQUAMOUS CELL CARCINOMA EXCISION     "cut off my hands; left arm; under right lip; right ear" (01/05/2014)  . THYROIDECTOMY, PARTIAL Right 1969   lobe-nodule present  . TOTAL KNEE ARTHROPLASTY Left 01/05/2014   Procedure: LEFT TOTAL  KNEE ARTHROPLASTY;  Surgeon: Mcarthur Rossetti, MD;  Location: [place];  Service: Orthopedics;  Laterality: Left;  . TOTAL KNEE ARTHROPLASTY Right 05/04/2014   Procedure: RIGHT TOTAL KNEE ARTHROPLASTY;  Surgeon: Mcarthur Rossetti, MD;  [facility];  Service: Orthopedics;  Laterality: Right;  . UMBILICAL HERNIA REPAIR  2000   Social History   Socioeconomic History  . Marital status: Married    Spouse name: Not on file  . Number of children: 2  . Years of education: Not on file  . Highest education level: Not on file  Occupational History  . Occupation: [employer]    Comment: Current employment  .  Occupation: Scientist, research (life sciences): Prior employment  Social Needs  . Financial resource strain: Not on file  . Food insecurity:    Worry: Not on file    Inability: Not on file  . Transportation needs:    Medical: Not on file    Non-medical: Not on file  Tobacco Use  . Smoking status: Former Smoker    Packs/day: 1.00    Years: 20.00    Pack years: 20.00    Types: Cigarettes    Start date: 07/06/1956    Last attempt to quit: 07/06/1980    Years since quitting: 38.3  . Smokeless tobacco: Never Used  . Tobacco comment: "quit smoking in ~ 1982"  Substance and Sexual Activity  . Alcohol use: Yes    Alcohol/week: 1.0 standard drinks    Types: 1 Cans of beer per week    Comment: rare  . Drug use: No  . Sexual activity: Not on file  Lifestyle  . Physical activity:    Days per week: Not on file    Minutes per session: Not on file  . Stress: Not on file  Relationships  . Social connections:    Talks on phone: Not on file    Gets together: Not on file    Attends religious service: Not on file    Active member of club or organization: Not on file    Attends meetings of clubs or organizations: Not on file    Relationship status: Not on file  Other Topics Concern  . Not on file  Social History Narrative  . Not on file   Outpatient Encounter Medications as of 10/28/2018  Medication Sig  . amLODipine (NORVASC) 10 MG tablet Take 10 mg by mouth every morning.   Marland Kitchen atorvastatin (LIPITOR) 40 MG tablet Take 1 tablet (40 mg total) by mouth daily. (Patient not taking: Reported on 07/25/2018)  . hydrALAZINE (APRESOLINE) 25 MG tablet Take 1 tablet (25 mg total) 2 (two) times daily by mouth. (Patient taking differently: Take 25 mg by mouth daily. )  . levothyroxine (SYNTHROID) 150 MCG tablet Take 1 tablet (150 mcg total) by mouth daily before breakfast.  . lisinopril (PRINIVIL,ZESTRIL) 40 MG tablet Take 1 tablet by mouth daily.  Marland Kitchen omeprazole (PRILOSEC) 40 MG capsule Take 1 capsule (40 mg  total) by mouth daily.  . tamsulosin (FLOMAX) 0.4 MG CAPS capsule Take 1 capsule by mouth daily.  . [DISCONTINUED] levothyroxine (SYNTHROID, LEVOTHROID) 150 MCG tablet Take 1 tablet (150 mcg total) by mouth daily before breakfast.  . [DISCONTINUED] Na Sulfate-K Sulfate-Mg Sulf (SUPREP BOWEL PREP KIT) 17.5-3.13-1.6 GM/177ML SOLN Take 1 kit by mouth as directed.   No facility-administered encounter medications on file as of 10/28/2018.    ALLERGIES: No Known Allergies  VACCINATION STATUS:  There is no immunization history  on file for this patient.  HPI 78 year old male patient with medical history as above. He is being seen in follow-up for postsurgical hypothyroidism.  He underwent what appears to be partial thyroidectomy for benign pathology in 1969. He was treated with various doses of levothyroxine over the years, currently on levothyroxine 150 g by mouth every morning. He reports compliance to this medication. He  takes it orally in the morning with water.  - He now reports that 1 of his daughters is diagnosed with some thyroid dysfunction.  He denies family history of thyroid cancer.  -He scribes anxiety, palpitations, and feeling hot.   -His previsit labs show above target thyroid hormone levels and suppressed TSH..   -He has a steady weight. - He denies exposure to neck radiation. -He denies dysphagia, shortness of breath, change in his voice.    Review of Systems Limited as above.   Objective:    There were no vitals taken for this visit.  Wt Readings from Last 3 Encounters:  07/25/18 260 lb (117.9 kg)  04/29/18 253 lb (114.8 kg)  03/23/18 254 lb (115.2 kg)    Physical Exam   CMP ( most recent) CMP     Component Value Date/Time   NA 141 07/10/2015 1100   K 4.1 07/10/2015 1100   CL 105 07/10/2015 1100   CO2 28 07/10/2015 1100   GLUCOSE 100 (H) 07/10/2015 1100   BUN 17 07/10/2015 1100   CREATININE 1.21 07/10/2015 1100   CREATININE 1.14 08/31/2012 1149    CALCIUM 9.8 07/10/2015 1100   PROT 6.8 08/31/2012 1149   ALBUMIN 4.5 08/31/2012 1149   AST 19 08/31/2012 1149   ALT 22 08/31/2012 1149   ALKPHOS 55 08/31/2012 1149   BILITOT 0.6 08/31/2012 1149   GFRNONAA 57 (L) 07/10/2015 1100   GFRAA >60 07/10/2015 1100     Lipid Panel ( most recent) Lipid Panel     Component Value Date/Time   CHOL 130 04/07/2018   TRIG 175 (A) 04/07/2018   HDL 30 (A) 04/07/2018   CHOLHDL 6.4 08/31/2012 1149   VLDL 52 (H) 08/31/2012 1149   LDLCALC 73 04/07/2018   Recent Results (from the past 2160 hour(s))  T4, free     Status: None   Collection Time: 10/19/18  8:13 AM  Result Value Ref Range   Free T4 1.5 0.8 - 1.8 ng/dL  TSH     Status: None   Collection Time: 10/19/18  8:13 AM  Result Value Ref Range   TSH 4.49 0.40 - 4.50 mIU/L     Labs from April 07, 2018: Total T4 14.2 high, free T4 4.4 high, TSH 0.22 suppressed Labs from 09/28/2016 showed TSH 5.39 elevated, free T3 3 0.3, total T4 11.3. 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  Assessment & Plan:   1. Postsurgical hypothyroidism -His recent thyroid function tests are consistent with appropriate replacement.  He is advised to continue levothyroxine 150 mcg p.o. daily before breakfast.      - We discussed about the correct intake of his thyroid hormone, on empty stomach at fasting, with water, separated by at least 30 minutes from breakfast and other medications,  and separated by more than 4 hours from calcium, iron, multivitamins, acid reflux medications (PPIs). -Patient is made aware of the fact that thyroid hormone replacement is needed for life, dose to be adjusted by periodic monitoring of thyroid function tests.  2. 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 He will not need any intervention.  - I advised patient to maintain close follow up with Michell Heinrich, DO for primary care needs.  Time for this visit 15 minutes.  Cottie Banda Basford participated in the discussions, expressed understanding, and voiced agreement with the above plans.  All questions were answered to his satisfaction. he is encouraged to contact clinic should he have any questions or concerns prior to his return visit.  Follow up plan: Return in about 6 months (around 04/29/2019), or include a copy of his labs., for Follow up with Pre-visit Labs.  Glade Lloyd, MD Phone: [phone]  Fax: [phone]   This note was partially dictated with voice recognition software. Similar sounding words can be transcribed inadequately or may not  be corrected upon review. 10/28/2018, 9:47 AM

## 2018-11-07 ENCOUNTER — Telehealth: Payer: Self-pay | Admitting: Internal Medicine

## 2018-11-07 NOTE — Telephone Encounter (Signed)
Called pt, he requested to reschedule TCS w/RMR that was for 11/30/18 d/t COVID-19. Procedure rescheduled to 01/04/19 at 2:00pm. Endo scheduler informed. New instructions mailed.

## 2018-11-07 NOTE — Telephone Encounter (Signed)
[phone] please call patient about rescheduling his procedure

## 2018-12-26 ENCOUNTER — Telehealth: Payer: Self-pay | Admitting: *Deleted

## 2018-12-26 NOTE — Telephone Encounter (Signed)
Pt is scheduled for his COVID 19 screening on 12/30/2018.  Pt is aware to remain in quarantine once testing is done.  Pt voiced understanding.

## 2018-12-30 ENCOUNTER — Other Ambulatory Visit: Payer: Self-pay

## 2018-12-30 ENCOUNTER — Other Ambulatory Visit (HOSPITAL_COMMUNITY)
Admission: RE | Admit: 2018-12-30 | Discharge: 2018-12-30 | Disposition: A | Discharge status: Home / Self Care | Payer: Medicare Other | Source: Ambulatory Visit | Attending: Internal Medicine | Admitting: Internal Medicine

## 2018-12-30 DIAGNOSIS — Z1159 Encounter for screening for other viral diseases: Secondary | ICD-10-CM | POA: Insufficient documentation

## 2018-12-31 LAB — NOVEL CORONAVIRUS, NAA (HOSP ORDER, SEND-OUT TO REF LAB; TAT 18-24 HRS): SARS-CoV-2, NAA: NOT DETECTED

## 2019-01-04 ENCOUNTER — Ambulatory Visit (HOSPITAL_COMMUNITY)
Admission: RE | Admit: 2019-01-04 | Discharge: 2019-01-04 | Disposition: A | Discharge status: Home / Self Care | Payer: Medicare Other | Attending: Internal Medicine | Admitting: Internal Medicine

## 2019-01-04 ENCOUNTER — Encounter (HOSPITAL_COMMUNITY): Payer: Self-pay | Admitting: *Deleted

## 2019-01-04 ENCOUNTER — Other Ambulatory Visit: Payer: Self-pay

## 2019-01-04 ENCOUNTER — Encounter (HOSPITAL_COMMUNITY)
Admission: RE | Disposition: A | Discharge status: Home / Self Care | Payer: Self-pay | Source: Home / Self Care | Attending: Internal Medicine

## 2019-01-04 DIAGNOSIS — Z79899 Other long term (current) drug therapy: Secondary | ICD-10-CM | POA: Diagnosis not present

## 2019-01-04 DIAGNOSIS — M1990 Unspecified osteoarthritis, unspecified site: Secondary | ICD-10-CM | POA: Insufficient documentation

## 2019-01-04 DIAGNOSIS — E039 Hypothyroidism, unspecified: Secondary | ICD-10-CM | POA: Insufficient documentation

## 2019-01-04 DIAGNOSIS — Z09 Encounter for follow-up examination after completed treatment for conditions other than malignant neoplasm: Secondary | ICD-10-CM | POA: Insufficient documentation

## 2019-01-04 DIAGNOSIS — D124 Benign neoplasm of descending colon: Secondary | ICD-10-CM | POA: Insufficient documentation

## 2019-01-04 DIAGNOSIS — Z87891 Personal history of nicotine dependence: Secondary | ICD-10-CM | POA: Insufficient documentation

## 2019-01-04 DIAGNOSIS — D120 Benign neoplasm of cecum: Secondary | ICD-10-CM | POA: Insufficient documentation

## 2019-01-04 DIAGNOSIS — K219 Gastro-esophageal reflux disease without esophagitis: Secondary | ICD-10-CM | POA: Insufficient documentation

## 2019-01-04 DIAGNOSIS — K635 Polyp of colon: Secondary | ICD-10-CM

## 2019-01-04 DIAGNOSIS — I10 Essential (primary) hypertension: Secondary | ICD-10-CM | POA: Diagnosis not present

## 2019-01-04 DIAGNOSIS — Z1211 Encounter for screening for malignant neoplasm of colon: Secondary | ICD-10-CM | POA: Diagnosis not present

## 2019-01-04 DIAGNOSIS — Z86010 Personal history of colonic polyps: Secondary | ICD-10-CM | POA: Diagnosis not present

## 2019-01-04 DIAGNOSIS — E785 Hyperlipidemia, unspecified: Secondary | ICD-10-CM | POA: Diagnosis not present

## 2019-01-04 HISTORY — PX: COLONOSCOPY: SHX5424

## 2019-01-04 SURGERY — COLONOSCOPY
Anesthesia: Moderate Sedation

## 2019-01-04 MED ORDER — ONDANSETRON HCL 4 MG/2ML IJ SOLN
INTRAMUSCULAR | Status: AC
  Filled 2019-01-04: qty 2

## 2019-01-04 MED ORDER — SODIUM CHLORIDE 0.9 % IV SOLN
INTRAVENOUS | Status: DC
  Administered 2019-01-04: 13:00:00 via INTRAVENOUS

## 2019-01-04 MED ORDER — MEPERIDINE HCL 100 MG/ML IJ SOLN
INTRAMUSCULAR | Status: DC | PRN
  Administered 2019-01-04 (×2): 25 mg

## 2019-01-04 MED ORDER — MIDAZOLAM HCL 5 MG/5ML IJ SOLN
INTRAMUSCULAR | Status: DC | PRN
  Administered 2019-01-04: 1 mg via INTRAVENOUS
  Administered 2019-01-04 (×2): 2 mg via INTRAVENOUS

## 2019-01-04 MED ORDER — MEPERIDINE HCL 50 MG/ML IJ SOLN
INTRAMUSCULAR | Status: AC
  Filled 2019-01-04: qty 1

## 2019-01-04 MED ORDER — MIDAZOLAM HCL 5 MG/5ML IJ SOLN
INTRAMUSCULAR | Status: AC
  Filled 2019-01-04: qty 10

## 2019-01-04 MED ORDER — ONDANSETRON HCL 4 MG/2ML IJ SOLN
INTRAMUSCULAR | Status: DC | PRN
  Administered 2019-01-04: 4 mg via INTRAVENOUS

## 2019-01-04 NOTE — H&P (Signed)
@LOGO @   Primary Care Physician:  Michell Heinrich, DO Primary Gastroenterologist:  Dr. Gala Romney  Pre-Procedure History & Physical: HPI:  [name] is a 78 y.o. male here for surveillance colonoscopy; history of multiple colonic adenomas removed 2017.  Past Medical History:  Diagnosis Date  . Arthritis    "fingers; knees; toes" (01/05/2014)  . Barrett's esophagus   . BPH (benign prostatic hyperplasia)   . Bradycardia 2009   Sinus rhythm in the 40s without symptoms;  Marland Kitchen GERD (gastroesophageal reflux disease)    ulcerative/erosive  . Gout   . Hemorrhoids   . Hiatal hernia   . History of skin cancer   . Hx of adenomatous colonic polyps   . Hyperlipidemia   . Hypertension   . Hypothyroidism    Following partial thyroidectomy  . Squamous carcinoma    skin cancer benign arms and right ear and rightside lip  . Tobacco abuse, in remission    10-20 pack years; discontinued in 1981    Past Surgical History:  Procedure Laterality Date  . ABDOMINAL HERNIA REPAIR  1990's  . BIOPSY  04/13/2018   Procedure: BIOPSY;  Surgeon: Daneil Dolin, MD;  Location: [facility];  Service: Endoscopy;;  . CATARACT EXTRACTION W/ INTRAOCULAR LENS  IMPLANT, BILATERAL Bilateral 2012-2014  . COLONOSCOPY  12/2005   normal  . COLONOSCOPY  2004   tubulovillous adenoma of rectum   . COLONOSCOPY  01/12/2011   Dr. Gala Romney- normal rectum  . COLONOSCOPY N/A 07/09/2015   Procedure: COLONOSCOPY;  Surgeon: Daneil Dolin, MD;  Location: [facility];  Service: Endoscopy;  Laterality: N/A;  730   . ELBOW ARTHROSCOPY Left 2014  . ESOPHAGEAL DILATION N/A 02/13/2015   Procedure: ESOPHAGEAL DILATION;  Surgeon: Daneil Dolin, MD;  Location: [facility];  Service: Endoscopy;  Laterality: N/A;  . ESOPHAGOGASTRODUODENOSCOPY  01/2006   geographic erosive RE, noncritical peptic stricture s/p dilation, antral erosion  . ESOPHAGOGASTRODUODENOSCOPY  01/12/2011   Dr. Gala Romney- hiatal hernia, distal esophageal erosions, barretts  esophagus  . ESOPHAGOGASTRODUODENOSCOPY  01/27/2012   Dr.Keonda Dow- short segment barretts esophagus, erosive reflux esophagitis. bx= barretts esophagus, negative for dysplasia  . ESOPHAGOGASTRODUODENOSCOPY N/A 02/13/2015   Dr.Tylon Kemmerling- ulcerative/erosive reflux esophagitis, abnormal esophagitis c/w prior diagnosis of barretts esophagus, hiatal hernia. bx=barretts esophagus  . ESOPHAGOGASTRODUODENOSCOPY N/A 04/13/2018   Procedure: ESOPHAGOGASTRODUODENOSCOPY (EGD);  Surgeon: Daneil Dolin, MD;  [facility]: [facility];  Service: Endoscopy;  Laterality: N/A;  2:15pm  . HEMORRHOID SURGERY N/A 07/12/2015   Procedure: SIMPLE HEMORRHOIDECTOMY;  Surgeon: Aviva Signs, MD;  Location: [facility];  Service: General;  Laterality: N/A;  . [place]  01/12/2011   Procedure: [name] DILATION;  Surgeon: Daneil Dolin, MD;  Location: [facility];  Service: Endoscopy;  Laterality: N/A;  56 french  . MALONEY DILATION N/A 04/13/2018   Procedure: [name] DILATION;  Surgeon: Daneil Dolin, MD;  Location: [facility];  Service: Endoscopy;  Laterality: N/A;  . SQUAMOUS CELL CARCINOMA EXCISION     "cut off my hands; left arm; under right lip; right ear" (01/05/2014)  . THYROIDECTOMY, PARTIAL Right 1969   lobe-nodule present  . TOTAL KNEE ARTHROPLASTY Left 01/05/2014   Procedure: LEFT TOTAL KNEE ARTHROPLASTY;  Surgeon: Mcarthur Rossetti, MD;  Location: [facility];  Service: Orthopedics;  Laterality: Left;  . TOTAL KNEE ARTHROPLASTY Right 05/04/2014   Procedure: RIGHT TOTAL KNEE ARTHROPLASTY;  Surgeon: Mcarthur Rossetti, MD;  Location: [facility];  Service: Orthopedics;  Laterality: Right;  . UMBILICAL HERNIA REPAIR  2000    Prior to Admission medications   Medication Sig Start Date End Date Taking? Authorizing Provider  amLODipine (NORVASC) 10 MG tablet Take 10 mg by mouth every morning.    Yes [provider]  chlorthalidone (HYGROTON) 25 MG tablet Take 25 mg by mouth daily. 10/18/18  Yes  [provider]  hydrALAZINE (APRESOLINE) 25 MG tablet Take 1 tablet (25 mg total) 2 (two) times daily by mouth. Patient taking differently: Take 25 mg by mouth daily.  05/19/17  Yes BranchAlphonse Guild, MD  levothyroxine (SYNTHROID) 150 MCG tablet Take 1 tablet (150 mcg total) by mouth daily before breakfast. 10/28/18  Yes Nida, Marella Chimes, MD  lisinopril (PRINIVIL,ZESTRIL) 40 MG tablet Take 40 mg by mouth daily.  07/24/15  Yes [provider]  omeprazole (PRILOSEC) 40 MG capsule Take 1 capsule (40 mg total) by mouth daily. Patient taking differently: Take 40 mg by mouth daily as needed (for acid reflux or heartburn).  07/25/18  Yes Carlis Stable, NP  tamsulosin (FLOMAX) 0.4 MG CAPS capsule Take 0.4 mg by mouth daily.  01/15/15  Yes [provider]    Allergies as of 07/25/2018  . (No Known Allergies)    Family History  Problem Relation Age of Onset  . Lung cancer Father 20  . Colon polyps Brother   . Colon cancer Neg Hx   . Liver disease Neg Hx     Social History   Socioeconomic History  . Marital status: Married    Spouse name: Not on file  . Number of children: 2  . Years of education: Not on file  . Highest education level: Not on file  Occupational History  . Occupation: [employer]    Comment: Current employment  . Occupation: Scientist, research (life sciences): Prior employment  Social Needs  . Financial resource strain: Not on file  . Food insecurity    Worry: Not on file    Inability: Not on file  . Transportation needs    Medical: Not on file    Non-medical: Not on file  Tobacco Use  . Smoking status: Former Smoker    Packs/day: 1.00    Years: 20.00    Pack years: 20.00    Types: Cigarettes    Start date: 07/06/1956    Quit date: 07/06/1980    Years since quitting: 38.5  . Smokeless tobacco: Never Used  . Tobacco comment: "quit smoking in ~ 1982"  Substance and Sexual Activity  . Alcohol use: Yes    Alcohol/week: 1.0 standard  drinks    Types: 1 Cans of beer per week    Comment: rare  . Drug use: No  . Sexual activity: Not on file  Lifestyle  . Physical activity    Days per week: Not on file    Minutes per session: Not on file  . Stress: Not on file  Relationships  . Social Herbalist on phone: Not on file    Gets together: Not on file    Attends religious service: Not on file    Active member of club or organization: Not on file    Attends meetings of clubs or organizations: Not on file    Relationship status: Not on file  . Intimate partner violence    Fear of current or ex partner: Not on file    Emotionally abused: Not on file  Physically abused: Not on file    Forced sexual activity: Not on file  Other Topics Concern  . Not on file  Social History Narrative  . Not on file    Review of Systems: See HPI, otherwise negative ROS  Physical Exam: BP (!) 168/88   Pulse 80   Temp 98.2 F (36.8 C) (Oral)   Resp 14   Ht 5\' 10"  (1.778 m)   Wt 114.3 kg   SpO2 97%   BMI 36.16 kg/m  General:   Alert,  Well-developed, well-nourished, pleasant and cooperative in NAD Neck:  Supple; no masses or thyromegaly. No significant cervical adenopathy. Lungs:  Clear throughout to auscultation.   No wheezes, crackles, or rhonchi. No acute distress. Heart:  Regular rate and rhythm; no murmurs, clicks, rubs,  or gallops. Abdomen: Non-distended, normal bowel sounds.  Soft and nontender without appreciable mass or hepatosplenomegaly.  Pulses:  Normal pulses noted. Extremities:  Without clubbing or edema.  Impression/Plan: 78 year old gentleman multiple colonic adenomas removed 3 years ago.  Here for surveillance colonoscopy.    Risk benefits alternatives have been reviewed questions answered.  He is agreeable.     Notice: This dictation was prepared with Dragon dictation along with smaller phrase technology. Any transcriptional errors that result from this process are unintentional and may not be  corrected upon review.

## 2019-01-04 NOTE — Op Note (Signed)
[name] Patient Name: [name] Procedure Date: 01/04/2019 12:07 PM MRN: [id number] Date of Birth: Aug 13, 1940 Attending MD: [name] , MD CSN: [id number] Age: 78 Admit Type: Outpatient Procedure:                Colonoscopy Indications:              High risk colon cancer surveillance: Personal                            history of colonic polyps Providers:                Benjamin Richards, MD, Benjamin Riggers, RN, [name], Technician Referring MD:              Medicines:                Midazolam 4 mg IV, Meperidine 50 mg IV, Ondansetron                            4 mg IV Complications:            No immediate complications. Estimated Blood Loss:     Estimated blood loss: none. Procedure:                Pre-Anesthesia Assessment:                           - Prior to the procedure, a History and Physical                            was performed, and patient medications and                            allergies were reviewed. The patient's tolerance of                            previous anesthesia was also reviewed. The risks                            and benefits of the procedure and the sedation                            options and risks were discussed with the patient.                            All questions were answered, and informed consent                            was obtained. Prior Anticoagulants: The patient has                            taken no previous anticoagulant or antiplatelet  agents. ASA Grade Assessment: II - A patient with                            mild systemic disease. After reviewing the risks                            and benefits, the patient was deemed in                            satisfactory condition to undergo the procedure.                           After obtaining informed consent, the colonoscope                            was passed under direct vision.  Throughout the                            procedure, the patient's blood pressure, pulse, and                            oxygen saturations were monitored continuously. The                            CF-HQ190L ([id number]) scope was introduced through                            the anus and advanced to the the cecum, identified                            by appendiceal orifice and ileocecal valve. The                            colonoscopy was performed without difficulty. The                            patient tolerated the procedure well. The quality                            of the bowel preparation was adequate. Scope In: 1:22:02 PM Scope Out: 1:34:40 PM Scope Withdrawal Time: 0 hours 9 minutes 56 seconds  Total Procedure Duration: 0 hours 12 minutes 38 seconds  Findings:      The perianal and digital rectal examinations were normal.      Two sessile polyps were found in the descending colon and cecum. The       polyps were 4 to 6 mm in size. These polyps were removed with a cold       snare. Resection and retrieval were complete. Estimated blood loss was       minimal.      The exam was otherwise without abnormality on direct and retroflexion       views. Impression:               - Two 4 to 6 mm polyps in the descending colon and  in the cecum, removed with a cold snare. Resected                            and retrieved.                           - The examination was otherwise normal on direct                            and retroflexion views. Moderate Sedation:      Moderate (conscious) sedation was administered by the endoscopy nurse       and supervised by the endoscopist. The following parameters were       monitored: oxygen saturation, heart rate, blood pressure, respiratory       rate, EKG, adequacy of pulmonary ventilation, and response to care.       Total physician intraservice time was 22 minutes. Recommendation:           - Patient has a  contact number available for                            emergencies. The signs and symptoms of potential                            delayed complications were discussed with the                            patient. Return to normal activities tomorrow.                            Written discharge instructions were provided to the                            patient.                           - Resume previous diet.                           - Continue present medications.                           - Repeat colonoscopy date to be determined after                            pending pathology results are reviewed for                            surveillance.                           - Return to GI office (date not yet determined). Procedure Code(s):        --- Professional ---                           [phone], Colonoscopy, flexible; with removal of  tumor(s), polyp(s), or other lesion(s) by snare                            technique                           G0500, Moderate sedation services provided by the                            same physician or other qualified health care                            professional performing a gastrointestinal                            endoscopic service that sedation supports,                            requiring the presence of an independent trained                            observer to assist in the monitoring of the                            patient's level of consciousness and physiological                            status; initial 15 minutes of intra-service time;                            patient age 87 years or older (additional time may                            be reported with [phone], as appropriate) Diagnosis Code(s):        --- Professional ---                           Z86.010, Personal history of colonic polyps                           K63.5, Polyp of colon CPT copyright 2019 American Medical Association. All  rights reserved. The codes documented in this report are preliminary and upon coder review may  be revised to meet current compliance requirements. [name]. Rourk, MD Benjamin Richards, MD 01/04/2019 1:42:02 PM This report has been signed electronically. Number of Addenda: 0

## 2019-01-04 NOTE — Discharge Instructions (Signed)
Colonoscopy Discharge Instructions  Read the instructions outlined below and refer to this sheet in the next few weeks. These discharge instructions provide you with general information on caring for yourself after you leave the hospital. Your doctor may also give you specific instructions. While your treatment has been planned according to the most current medical practices available, unavoidable complications occasionally occur. If you have any problems or questions after discharge, call Dr. Gala Romney at [phone]. ACTIVITY  You may resume your regular activity, but move at a slower pace for the next 24 hours.   Take frequent rest periods for the next 24 hours.   Walking will help get rid of the air and reduce the bloated feeling in your belly (abdomen).   No driving for 24 hours (because of the medicine (anesthesia) used during the test).    Do not sign any important legal documents or operate any machinery for 24 hours (because of the anesthesia used during the test).  NUTRITION  Drink plenty of fluids.   You may resume your normal diet as instructed by your doctor.   Begin with a light meal and progress to your normal diet. Heavy or fried foods are harder to digest and may make you feel sick to your stomach (nauseated).   Avoid alcoholic beverages for 24 hours or as instructed.  MEDICATIONS  You may resume your normal medications unless your doctor tells you otherwise.  WHAT YOU CAN EXPECT TODAY  Some feelings of bloating in the abdomen.   Passage of more gas than usual.   Spotting of blood in your stool or on the toilet paper.  IF YOU HAD POLYPS REMOVED DURING THE COLONOSCOPY:  No aspirin products for 7 days or as instructed.   No alcohol for 7 days or as instructed.   Eat a soft diet for the next 24 hours.  FINDING OUT THE RESULTS OF YOUR TEST Not all test results are available during your visit. If your test results are not back during the visit, make an appointment  with your caregiver to find out the results. Do not assume everything is normal if you have not heard from your caregiver or the medical facility. It is important for you to follow up on all of your test results.  SEEK IMMEDIATE MEDICAL ATTENTION IF:  You have more than a spotting of blood in your stool.   Your belly is swollen (abdominal distention).   You are nauseated or vomiting.   You have a temperature over 101.   You have abdominal pain or discomfort that is severe or gets worse throughout the day.   Colon polyp information provided  Further recommendations to follow pending review of pathology report  At patient request, I discussed with wife, Juliann Pulse, at [phone]   Colon Polyps  Polyps are tissue growths inside the body. Polyps can grow in many places, including the large intestine (colon). A polyp may be a round bump or a mushroom-shaped growth. You could have one polyp or several. Most colon polyps are noncancerous (benign). However, some colon polyps can become cancerous over time. Finding and removing the polyps early can help prevent this. What are the causes? The exact cause of colon polyps is not known. What increases the risk? You are more likely to develop this condition if you:  Have a family history of colon cancer or colon polyps.  Are older than 61 or older than 45 if you are African American.  Have inflammatory bowel disease, such as ulcerative  colitis or Crohn's disease.  Have certain hereditary conditions, such as: ? Familial adenomatous polyposis. ? Lynch syndrome. ? Turcot syndrome. ? Peutz-Jeghers syndrome.  Are overweight.  Smoke cigarettes.  Do not get enough exercise.  Drink too much alcohol.  Eat a diet that is high in fat and red meat and low in fiber.  Had childhood cancer that was treated with abdominal radiation. What are the signs or symptoms? Most polyps do not cause symptoms. If you have symptoms, they may  include:  Blood coming from your rectum when having a bowel movement.  Blood in your stool. The stool may look dark red or black.  Abdominal pain.  A change in bowel habits, such as constipation or diarrhea. How is this diagnosed? This condition is diagnosed with a colonoscopy. This is a procedure in which a lighted, flexible scope is inserted into the anus and then passed into the colon to examine the area. Polyps are sometimes found when a colonoscopy is done as part of routine cancer screening tests. How is this treated? Treatment for this condition involves removing any polyps that are found. Most polyps can be removed during a colonoscopy. Those polyps will then be tested for cancer. Additional treatment may be needed depending on the results of testing. Follow these instructions at home: Lifestyle  Maintain a healthy weight, or lose weight if recommended by your health care provider.  Exercise every day or as told by your health care provider.  Do not use any products that contain nicotine or tobacco, such as cigarettes and e-cigarettes. If you need help quitting, ask your health care provider.  If you drink alcohol, limit how much you have: ? 0-1 drink a day for women. ? 0-2 drinks a day for men.  Be aware of how much alcohol is in your drink. In the [place] one drink equals one 12 oz bottle of beer (355 mL), one 5 oz glass of wine (148 mL), or one 1 oz shot of hard liquor (44 mL). Eating and drinking   Eat foods that are high in fiber, such as fruits, vegetables, and whole grains.  Eat foods that are high in calcium and vitamin D, such as milk, cheese, yogurt, eggs, liver, fish, and broccoli.  Limit foods that are high in fat, such as fried foods and desserts.  Limit the amount of red meat and processed meat you eat, such as hot dogs, sausage, bacon, and lunch meats. General instructions  Keep all follow-up visits as told by your health care provider. This is  important. ? This includes having regularly scheduled colonoscopies. ? Talk to your health care provider about when you need a colonoscopy. Contact a health care provider if:  You have new or worsening bleeding during a bowel movement.  You have new or increased blood in your stool.  You have a change in bowel habits.  You lose weight for no known reason. Summary  Polyps are tissue growths inside the body. Polyps can grow in many places, including the colon.  Most colon polyps are noncancerous (benign), but some can become cancerous over time.  This condition is diagnosed with a colonoscopy.  Treatment for this condition involves removing any polyps that are found. Most polyps can be removed during a colonoscopy. This information is not intended to replace advice given to you by your health care provider. Make sure you discuss any questions you have with your health care provider. Document Released: 03/18/2004 Document Revised: 10/07/2017 Document Reviewed: 10/07/2017 Elsevier  Patient Education  [employer].

## 2019-01-05 ENCOUNTER — Encounter: Payer: Self-pay | Admitting: Internal Medicine

## 2019-01-10 ENCOUNTER — Encounter (HOSPITAL_COMMUNITY): Payer: Self-pay | Admitting: Internal Medicine

## 2019-04-05 ENCOUNTER — Encounter: Payer: Self-pay | Admitting: Internal Medicine

## 2019-04-21 ENCOUNTER — Other Ambulatory Visit: Payer: Self-pay

## 2019-04-21 ENCOUNTER — Encounter: Payer: Self-pay | Admitting: Internal Medicine

## 2019-04-21 ENCOUNTER — Ambulatory Visit (INDEPENDENT_AMBULATORY_CARE_PROVIDER_SITE_OTHER): Payer: Medicare Other | Admitting: Internal Medicine

## 2019-04-21 VITALS — BP 138/76 | HR 64 | Temp 97.1°F | Ht 70.0 in | Wt 259.0 lb

## 2019-04-21 DIAGNOSIS — K219 Gastro-esophageal reflux disease without esophagitis: Secondary | ICD-10-CM

## 2019-04-21 DIAGNOSIS — R1319 Other dysphagia: Secondary | ICD-10-CM

## 2019-04-21 DIAGNOSIS — K2270 Barrett's esophagus without dysplasia: Secondary | ICD-10-CM | POA: Diagnosis not present

## 2019-04-21 DIAGNOSIS — R1310 Dysphagia, unspecified: Secondary | ICD-10-CM | POA: Diagnosis not present

## 2019-04-21 NOTE — Patient Instructions (Signed)
Schedule an EGD with esophageal dilation  -  Dysphagia  -  Conscious sedation  Continue omeprazole 40 mg daily  Further recommendations to follow

## 2019-04-22 NOTE — Progress Notes (Signed)
Primary Care Physician:  Michell Heinrich, DO Primary Gastroenterologist:  Dr. Gala Romney  Pre-Procedure History & Physical: HPI:  [name] is a 78 y.o. male here for follow-up of GERD.  GERD symptoms well controlled on omeprazole 40 mg daily.   EGD a little over a year ago demonstrated short segment Barrett's esophagus.  No dysplasia.  Otherwise, normal patent appearing tubular esophagus.  Status post passage of a 18 [place] Maloney dilator.  Dysphagia resolved until the past couple of months when he has noted recurrent symptoms  Past Medical History:  Diagnosis Date  . Arthritis    "fingers; knees; toes" (01/05/2014)  . Barrett's esophagus   . BPH (benign prostatic hyperplasia)   . Bradycardia 2009   Sinus rhythm in the 40s without symptoms;  Marland Kitchen GERD (gastroesophageal reflux disease)    ulcerative/erosive  . Gout   . Hemorrhoids   . Hiatal hernia   . History of skin cancer   . Hx of adenomatous colonic polyps   . Hyperlipidemia   . Hypertension   . Hypothyroidism    Following partial thyroidectomy  . Squamous carcinoma    skin cancer benign arms and right ear and rightside lip  . Tobacco abuse, in remission    10-20 pack years; discontinued in 1981    Past Surgical History:  Procedure Laterality Date  . ABDOMINAL HERNIA REPAIR  1990's  . BIOPSY  04/13/2018   Procedure: BIOPSY;  Surgeon: Daneil Dolin, MD;  [facility];  Service: Endoscopy;;  . CATARACT EXTRACTION W/ INTRAOCULAR LENS  IMPLANT, BILATERAL Bilateral 2012-2014  . COLONOSCOPY  12/2005   normal  . COLONOSCOPY  2004   tubulovillous adenoma of rectum   . COLONOSCOPY  01/12/2011   Dr. Gala Romney- normal rectum  . COLONOSCOPY N/A 07/09/2015   Procedure: COLONOSCOPY;  Surgeon: Daneil Dolin, MD;  [facility];  Service: Endoscopy;  Laterality: N/A;  730   . COLONOSCOPY N/A 01/04/2019   Procedure: COLONOSCOPY;  Surgeon: Daneil Dolin, MD;  [facility];  Service: Endoscopy;  Laterality:  N/A;  10:45am  . ELBOW ARTHROSCOPY Left 2014  . ESOPHAGEAL DILATION N/A 02/13/2015   Procedure: ESOPHAGEAL DILATION;  Surgeon: Daneil Dolin, MD;  [facility];  Service: Endoscopy;  Laterality: N/A;  . ESOPHAGOGASTRODUODENOSCOPY  01/2006   geographic erosive RE, noncritical peptic stricture s/p dilation, antral erosion  . ESOPHAGOGASTRODUODENOSCOPY  01/12/2011   Dr. Gala Romney- hiatal hernia, distal esophageal erosions, barretts esophagus  . ESOPHAGOGASTRODUODENOSCOPY  01/27/2012   Dr.Shahab Polhamus- short segment barretts esophagus, erosive reflux esophagitis. bx= barretts esophagus, negative for dysplasia  . ESOPHAGOGASTRODUODENOSCOPY N/A 02/13/2015   Dr.Shontia Gillooly- ulcerative/erosive reflux esophagitis, abnormal esophagitis c/w prior diagnosis of barretts esophagus, hiatal hernia. bx=barretts esophagus  . ESOPHAGOGASTRODUODENOSCOPY N/A 04/13/2018   Procedure: ESOPHAGOGASTRODUODENOSCOPY (EGD);  Surgeon: Daneil Dolin, MD;  [facility];  Service: Endoscopy;  Laterality: N/A;  2:15pm  . HEMORRHOID SURGERY N/A 07/12/2015   Procedure: SIMPLE HEMORRHOIDECTOMY;  Surgeon: Aviva Signs, MD;  Location: [facility];  Service: General;  Laterality: N/A;  . [place]  01/12/2011   Procedure: [name] DILATION;  Surgeon: Daneil Dolin, MD;  [facility];  Service: Endoscopy;  Laterality: N/A;  56 french  . MALONEY DILATION N/A 04/13/2018   Procedure: [name] DILATION;  Surgeon: Daneil Dolin, MD;  [facility];  Service: Endoscopy;  Laterality: N/A;  . SQUAMOUS CELL CARCINOMA EXCISION     "  cut off my hands; left arm; under right lip; right ear" (01/05/2014)  . THYROIDECTOMY, PARTIAL Right 1969   lobe-nodule present  . TOTAL KNEE ARTHROPLASTY Left 01/05/2014   Procedure: LEFT TOTAL KNEE ARTHROPLASTY;  Surgeon: Mcarthur Rossetti, MD;  Location: [place];  Service: Orthopedics;  Laterality: Left;  . TOTAL KNEE ARTHROPLASTY Right 05/04/2014   Procedure: RIGHT TOTAL  KNEE ARTHROPLASTY;  Surgeon: Mcarthur Rossetti, MD;  Location: [facility];  Service: Orthopedics;  Laterality: Right;  . UMBILICAL HERNIA REPAIR  2000    Prior to Admission medications   Medication Sig Start Date End Date Taking? Authorizing Provider  amLODipine (NORVASC) 10 MG tablet Take 10 mg by mouth every morning.    Yes [provider]  chlorthalidone (HYGROTON) 25 MG tablet Take 25 mg by mouth daily. 10/18/18  Yes [provider]  hydrALAZINE (APRESOLINE) 25 MG tablet Take 1 tablet (25 mg total) 2 (two) times daily by mouth. Patient taking differently: Take 25 mg by mouth daily.  05/19/17  Yes BranchAlphonse Guild, MD  levothyroxine (SYNTHROID) 150 MCG tablet Take 1 tablet (150 mcg total) by mouth daily before breakfast. 10/28/18  Yes Nida, Marella Chimes, MD  lisinopril (PRINIVIL,ZESTRIL) 40 MG tablet Take 40 mg by mouth daily.  07/24/15  Yes [provider]  omeprazole (PRILOSEC) 40 MG capsule Take 1 capsule (40 mg total) by mouth daily. Patient taking differently: Take 40 mg by mouth daily as needed (for acid reflux or heartburn).  07/25/18  Yes Carlis Stable, NP  tamsulosin (FLOMAX) 0.4 MG CAPS capsule Take 0.4 mg by mouth daily.  01/15/15  Yes [provider]    Allergies as of 04/21/2019  . (No Known Allergies)    Family History  Problem Relation Age of Onset  . Lung cancer Father 76  . Colon polyps Brother   . Colon cancer Neg Hx   . Liver disease Neg Hx     Social History   Socioeconomic History  . Marital status: Married    Spouse name: Not on file  . Number of children: 2  . Years of education: Not on file  . Highest education level: Not on file  Occupational History  . Occupation: [employer]    Comment: Current employment  . Occupation: Scientist, research (life sciences): Prior employment  Social Needs  . Financial resource strain: Not on file  . Food insecurity    Worry: Not on file    Inability: Not on file  .  Transportation needs    Medical: Not on file    Non-medical: Not on file  Tobacco Use  . Smoking status: Former Smoker    Packs/day: 1.00    Years: 20.00    Pack years: 20.00    Types: Cigarettes    Start date: 07/06/1956    Quit date: 07/06/1980    Years since quitting: 38.8  . Smokeless tobacco: Never Used  . Tobacco comment: "quit smoking in ~ 1982"  Substance and Sexual Activity  . Alcohol use: Yes    Alcohol/week: 1.0 standard drinks    Types: 1 Cans of beer per week    Comment: rare  . Drug use: No  . Sexual activity: Not on file  Lifestyle  . Physical activity    Days per week: Not on file    Minutes per session: Not on file  . Stress: Not on file  Relationships  . Social Herbalist on phone: Not  on file    Gets together: Not on file    Attends religious service: Not on file    Active member of club or organization: Not on file    Attends meetings of clubs or organizations: Not on file    Relationship status: Not on file  . Intimate partner violence    Fear of current or ex partner: Not on file    Emotionally abused: Not on file    Physically abused: Not on file    Forced sexual activity: Not on file  Other Topics Concern  . Not on file  Social History Narrative  . Not on file    Review of Systems: See HPI, otherwise negative ROS  Physical Exam: BP 138/76   Pulse 64   Temp (!) 97.1 F (36.2 C) (Oral)   Ht 5\' 10"  (1.778 m)   Wt 259 lb (117.5 kg)   BMI 37.16 kg/m  General:   Alert, , pleasant and cooperative in NAD Neck:  Supple; no masses or thyromegaly. No significant cervical adenopathy. Lungs:  Clear throughout to auscultation.   No wheezes, crackles, or rhonchi. No acute distress. Heart:  Regular rate and rhythm; no murmurs, clicks, rubs,  or gallops. Abdomen: Non-distended, normal bowel sounds.  Soft and nontender without appreciable mass or hepatosplenomegaly.  Pulses:  Normal pulses noted. Extremities:  Without clubbing or edema.   Impression/Plan: Pleasant 78 year old gentleman with a history of GERD and short segment Barrett's esophagus.  GERD symptoms well controlled on omeprazole 40 mg daily.  Recurrent esophageal dysphagia.  He had a fairly large bore dilator passed a little over a year ago with good results albeit temporary.  With his recurrent dysphagia and history of Barrett's.  I offered him a repeat EGD with dilation as feasible/appropriate.  Perhaps, a larger bore dilator can be passed.  I doubt were dealing with a motility disorder.  Recommendations: I have offered the patient a diagnostic EGD with possible esophageal dilation per plan.  The risks, benefits, limitations, alternatives and imponderables have been reviewed with the patient. Potential for esophageal dilation, biopsy, etc. have also been reviewed.  Questions have been answered. All parties agreeable.Schedule an EGD with esophageal dilation  - -  Conscious sedation  Continue omeprazole 40 mg daily  Further recommendations to follow    Notice: This dictation was prepared with Dragon dictation along with smaller phrase technology. Any transcriptional errors that result from this process are unintentional and may not be corrected upon review.

## 2019-04-24 ENCOUNTER — Other Ambulatory Visit: Payer: Self-pay

## 2019-04-24 ENCOUNTER — Telehealth: Payer: Self-pay

## 2019-04-24 DIAGNOSIS — R1319 Other dysphagia: Secondary | ICD-10-CM

## 2019-04-24 DIAGNOSIS — R1310 Dysphagia, unspecified: Secondary | ICD-10-CM

## 2019-04-24 NOTE — Telephone Encounter (Signed)
Tried to call pt to schedule EGD/DIL w/RMR, no answer, LMOVM for return call.

## 2019-04-24 NOTE — Telephone Encounter (Signed)
Pt called office, EGD/DIL w/RMR scheduled for 06/27/19 at 11:15am. COVID test 06/23/19 at 9:15am. Orders entered. Appt letter and procedure instructions mailed.

## 2019-04-25 LAB — TSH: TSH: 2.03 (ref <=5.90)

## 2019-04-26 ENCOUNTER — Other Ambulatory Visit: Payer: Self-pay

## 2019-04-26 DIAGNOSIS — K2270 Barrett's esophagus without dysplasia: Secondary | ICD-10-CM

## 2019-04-26 DIAGNOSIS — D126 Benign neoplasm of colon, unspecified: Secondary | ICD-10-CM

## 2019-04-26 DIAGNOSIS — K2100 Gastro-esophageal reflux disease with esophagitis, without bleeding: Secondary | ICD-10-CM

## 2019-04-26 MED ORDER — OMEPRAZOLE 40 MG PO CPDR: 40.0000 mg | DELAYED_RELEASE_CAPSULE | Freq: Every day | ORAL | 3 refills | Status: DC

## 2019-05-02 ENCOUNTER — Encounter: Payer: Self-pay | Admitting: "Endocrinology

## 2019-05-02 ENCOUNTER — Other Ambulatory Visit: Payer: Self-pay

## 2019-05-02 ENCOUNTER — Ambulatory Visit (INDEPENDENT_AMBULATORY_CARE_PROVIDER_SITE_OTHER): Payer: Medicare Other | Admitting: "Endocrinology

## 2019-05-02 DIAGNOSIS — E890 Postprocedural hypothyroidism: Secondary | ICD-10-CM

## 2019-05-02 MED ORDER — LEVOTHYROXINE SODIUM 137 MCG PO TABS: 137.0000 ug | ORAL_TABLET | Freq: Every day | ORAL | 6 refills | Status: DC

## 2019-05-03 NOTE — Progress Notes (Signed)
05/02/19                                Endocrinology Telehealth Visit Follow up Note -During COVID -19 Pandemic  I connected with [name] on 05/03/2019   by telephone and verified that I am speaking with the correct person using two identifiers. [place], April 20, 1941. he has verbally consented to this visit. All issues noted in this document were discussed and addressed. The format was not optimal for physical exam.   Subjective:    Patient ID: [name], male    DOB: 02-Feb-1941, PCP Michell Heinrich, DO   Past Medical History:  Diagnosis Date  . Arthritis    "fingers; knees; toes" (01/05/2014)  . Barrett's esophagus   . BPH (benign prostatic hyperplasia)   . Bradycardia 2009   Sinus rhythm in the 40s without symptoms;  Marland Kitchen GERD (gastroesophageal reflux disease)    ulcerative/erosive  . Gout   . Hemorrhoids   . Hiatal hernia   . History of skin cancer   . Hx of adenomatous colonic polyps   . Hyperlipidemia   . Hypertension   . Hypothyroidism    Following partial thyroidectomy  . Squamous carcinoma    skin cancer benign arms and right ear and rightside lip  . Tobacco abuse, in remission    10-20 pack years; discontinued in 1981   Past Surgical History:  Procedure Laterality Date  . ABDOMINAL HERNIA REPAIR  1990's  . BIOPSY  04/13/2018   Procedure: BIOPSY;  Surgeon: Daneil Dolin, MD;  [facility];  Service: Endoscopy;;  . CATARACT EXTRACTION W/ INTRAOCULAR LENS  IMPLANT, BILATERAL Bilateral 2012-2014  . COLONOSCOPY  12/2005   normal  . COLONOSCOPY  2004   tubulovillous adenoma of rectum   . COLONOSCOPY  01/12/2011   Dr. Gala Romney- normal rectum  . COLONOSCOPY N/A 07/09/2015   Procedure: COLONOSCOPY;  Surgeon: Daneil Dolin, MD;  [facility];  Service: Endoscopy;  Laterality: N/A;  730   . COLONOSCOPY N/A 01/04/2019   Procedure: COLONOSCOPY;  Surgeon: Daneil Dolin, MD;  [facility];  Service: Endoscopy;  Laterality: N/A;   10:45am  . ELBOW ARTHROSCOPY Left 2014  . ESOPHAGEAL DILATION N/A 02/13/2015   Procedure: ESOPHAGEAL DILATION;  Surgeon: Daneil Dolin, MD;  [facility];  Service: Endoscopy;  Laterality: N/A;  . ESOPHAGOGASTRODUODENOSCOPY  01/2006   geographic erosive RE, noncritical peptic stricture s/p dilation, antral erosion  . ESOPHAGOGASTRODUODENOSCOPY  01/12/2011   Dr. Gala Romney- hiatal hernia, distal esophageal erosions, barretts esophagus  . ESOPHAGOGASTRODUODENOSCOPY  01/27/2012   Dr.Rourk- short segment barretts esophagus, erosive reflux esophagitis. bx= barretts esophagus, negative for dysplasia  . ESOPHAGOGASTRODUODENOSCOPY N/A 02/13/2015   Dr.Rourk- ulcerative/erosive reflux esophagitis, abnormal esophagitis c/w prior diagnosis of barretts esophagus, hiatal hernia. bx=barretts esophagus  . ESOPHAGOGASTRODUODENOSCOPY N/A 04/13/2018   Procedure: ESOPHAGOGASTRODUODENOSCOPY (EGD);  Surgeon: Daneil Dolin, MD;  [facility];  Service: Endoscopy;  Laterality: N/A;  2:15pm  . HEMORRHOID SURGERY N/A 07/12/2015   Procedure: SIMPLE HEMORRHOIDECTOMY;  Surgeon: Aviva Signs, MD;  [facility];  Service: General;  Laterality: N/A;  . [place]  01/12/2011   Procedure: [name] DILATION;  Surgeon: Daneil Dolin, MD;  [facility];  Service: Endoscopy;  Laterality: N/A;  56 french  . MALONEY DILATION N/A 04/13/2018   Procedure:  MALONEY DILATION;  Surgeon: Daneil Dolin, MD;  [facility];  Service: Endoscopy;  Laterality: N/A;  . SQUAMOUS CELL CARCINOMA EXCISION     "cut off my hands; left arm; under right lip; right ear" (01/05/2014)  . THYROIDECTOMY, PARTIAL Right 1969   lobe-nodule present  . TOTAL KNEE ARTHROPLASTY Left 01/05/2014   Procedure: LEFT TOTAL KNEE ARTHROPLASTY;  Surgeon: Mcarthur Rossetti, MD;  Location: [place];  Service: Orthopedics;  Laterality: Left;  . TOTAL KNEE ARTHROPLASTY Right 05/04/2014   Procedure: RIGHT TOTAL KNEE  ARTHROPLASTY;  Surgeon: Mcarthur Rossetti, MD;  Location[facility];  Service: Orthopedics;  Laterality: Right;  . UMBILICAL HERNIA REPAIR  2000   Social History   Socioeconomic History  . Marital status: Married    Spouse name: Not on file  . Number of children: 2  . Years of education: Not on file  . Highest education level: Not on file  Occupational History  . Occupation: [employer]    Comment: Current employment  . Occupation: Scientist, research (life sciences): Prior employment  Social Needs  . Financial resource strain: Not on file  . Food insecurity    Worry: Not on file    Inability: Not on file  . Transportation needs    Medical: Not on file    Non-medical: Not on file  Tobacco Use  . Smoking status: Former Smoker    Packs/day: 1.00    Years: 20.00    Pack years: 20.00    Types: Cigarettes    Start date: 07/06/1956    Quit date: 07/06/1980    Years since quitting: 38.8  . Smokeless tobacco: Never Used  . Tobacco comment: "quit smoking in ~ 1982"  Substance and Sexual Activity  . Alcohol use: Yes    Alcohol/week: 1.0 standard drinks    Types: 1 Cans of beer per week    Comment: rare  . Drug use: No  . Sexual activity: Not on file  Lifestyle  . Physical activity    Days per week: Not on file    Minutes per session: Not on file  . Stress: Not on file  Relationships  . Social Herbalist on phone: Not on file    Gets together: Not on file    Attends religious service: Not on file    Active member of club or organization: Not on file    Attends meetings of clubs or organizations: Not on file    Relationship status: Not on file  Other Topics Concern  . Not on file  Social History Narrative  . Not on file   Outpatient Encounter Medications as of 05/02/2019  Medication Sig  . amLODipine (NORVASC) 10 MG tablet Take 10 mg by mouth every morning.   . chlorthalidone (HYGROTON) 25 MG tablet Take 25 mg by mouth daily.  . hydrALAZINE (APRESOLINE) 25  MG tablet Take 1 tablet (25 mg total) 2 (two) times daily by mouth. (Patient taking differently: Take 25 mg by mouth daily. )  . levothyroxine (SYNTHROID) 137 MCG tablet Take 1 tablet (137 mcg total) by mouth daily before breakfast.  . lisinopril (PRINIVIL,ZESTRIL) 40 MG tablet Take 40 mg by mouth daily.   Marland Kitchen omeprazole (PRILOSEC) 40 MG capsule Take 1 capsule (40 mg total) by mouth daily.  . tamsulosin (FLOMAX) 0.4 MG CAPS capsule Take 0.4 mg by mouth daily.   . [DISCONTINUED] levothyroxine (SYNTHROID) 150 MCG tablet Take 1 tablet (150 mcg total)  by mouth daily before breakfast.   No facility-administered encounter medications on file as of 05/02/2019.    ALLERGIES: No Known Allergies  VACCINATION STATUS:  There is no immunization history on file for this patient.  HPI 78 year old male patient with medical history as above. He is being seen in follow-up for postsurgical hypothyroidism.  He underwent what appears to be partial thyroidectomy for benign pathology in 1969. He was treated with various doses of levothyroxine over the years, currently on levothyroxine 150 g by mouth every morning. He reports compliance to this medication. He  takes it orally in the morning with water.  His previsit thyroid function tests are consistent with slight over replacement.  He has family history of thyroid dysfunction.  He denies family history of thyroid cancer.  -He scribes anxiety, palpitations, and feeling hot.   -His previsit labs show above target thyroid hormone levels and suppressed TSH..   -He has a steady weight. - He denies exposure to neck radiation. -He denies dysphagia, shortness of breath, change in his voice.    Review of Systems Limited as above.   Objective:    There were no vitals taken for this visit.  Wt Readings from Last 3 Encounters:  04/21/19 259 lb (117.5 kg)  01/04/19 252 lb (114.3 kg)  07/25/18 260 lb (117.9 kg)    Physical Exam   CMP ( most recent) CMP      Component Value Date/Time   NA 141 07/10/2015 1100   K 4.1 07/10/2015 1100   CL 105 07/10/2015 1100   CO2 28 07/10/2015 1100   GLUCOSE 100 (H) 07/10/2015 1100   BUN 17 07/10/2015 1100   CREATININE 1.21 07/10/2015 1100   CREATININE 1.14 08/31/2012 1149   CALCIUM 9.8 07/10/2015 1100   PROT 6.8 08/31/2012 1149   ALBUMIN 4.5 08/31/2012 1149   AST 19 08/31/2012 1149   ALT 22 08/31/2012 1149   ALKPHOS 55 08/31/2012 1149   BILITOT 0.6 08/31/2012 1149   GFRNONAA 57 (L) 07/10/2015 1100   GFRAA >60 07/10/2015 1100     Lipid Panel ( most recent) Lipid Panel     Component Value Date/Time   CHOL 130 04/07/2018   TRIG 175 (A) 04/07/2018   HDL 30 (A) 04/07/2018   CHOLHDL 6.4 08/31/2012 1149   VLDL 52 (H) 08/31/2012 1149   LDLCALC 73 04/07/2018   Recent Results (from the past 2160 hour(s))  TSH     Status: None   Collection Time: 04/25/19 12:00 AM  Result Value Ref Range   TSH 2.03 0.41 - 5.90    Comment: Free T4 - 4.0    T4 total 13.2     Labs from April 07, 2018: Total T4 14.2 high, free T4 4.4 high, TSH 0.22 suppressed Labs from 09/28/2016 showed TSH 5.39 elevated, free T3 3 0.3, total T4 11.3. 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  Assessment & Plan:   1. Postsurgical hypothyroidism -His recent thyroid function tests are consistent with thyroid hormone replacement.  Approach for a lower dose of levothyroxine at 137 mcg p.o. daily before breakfast.   - We discussed about the correct intake of his thyroid hormone, on empty stomach at fasting, with water, separated by at least 30 minutes from breakfast and other medications,  and separated by more than 4 hours from calcium, iron, multivitamins, acid reflux medications (PPIs). -Patient is made aware of the fact that thyroid hormone  replacement is needed for life, dose to be adjusted by periodic monitoring of thyroid function tests.  2. 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 He will not need any intervention.  - I advised patient to maintain close follow up with Michell Heinrich, DO for primary care needs.   Time for this visit: 15 minutes. [name]  participated in the discussions, expressed understanding, and voiced agreement with the above plans.  All questions were answered to his satisfaction. he is encouraged to contact clinic should he have any questions or concerns prior to his return visit.  Follow up plan: Return in about 6 months (around 10/31/2019) for Follow up with Pre-visit Labs.  Glade Lloyd, MD Phone: [phone]  Fax: [phone]   This note was partially dictated with voice recognition software. Similar sounding words can be transcribed inadequately or may not  be corrected upon review. 05/03/2019, 8:45 AM

## 2019-06-20 ENCOUNTER — Encounter: Payer: Self-pay | Admitting: Internal Medicine

## 2019-06-23 ENCOUNTER — Other Ambulatory Visit: Payer: Self-pay

## 2019-06-23 ENCOUNTER — Other Ambulatory Visit (HOSPITAL_COMMUNITY)
Admission: RE | Admit: 2019-06-23 | Discharge: 2019-06-23 | Disposition: A | Discharge status: Home / Self Care | Payer: Medicare Other | Source: Ambulatory Visit | Attending: Internal Medicine | Admitting: Internal Medicine

## 2019-06-23 ENCOUNTER — Other Ambulatory Visit (HOSPITAL_COMMUNITY): Payer: Medicare Other

## 2019-06-23 DIAGNOSIS — Z20828 Contact with and (suspected) exposure to other viral communicable diseases: Secondary | ICD-10-CM | POA: Insufficient documentation

## 2019-06-23 DIAGNOSIS — Z01812 Encounter for preprocedural laboratory examination: Secondary | ICD-10-CM | POA: Diagnosis present

## 2019-06-23 LAB — SARS CORONAVIRUS 2 (TAT 6-24 HRS): SARS Coronavirus 2: NEGATIVE

## 2019-06-27 ENCOUNTER — Encounter (HOSPITAL_COMMUNITY)
Admission: RE | Disposition: A | Discharge status: Home / Self Care | Payer: Self-pay | Source: Home / Self Care | Attending: Internal Medicine

## 2019-06-27 ENCOUNTER — Other Ambulatory Visit: Payer: Self-pay

## 2019-06-27 ENCOUNTER — Encounter (HOSPITAL_COMMUNITY): Payer: Self-pay | Admitting: Internal Medicine

## 2019-06-27 ENCOUNTER — Ambulatory Visit (HOSPITAL_COMMUNITY)
Admission: RE | Admit: 2019-06-27 | Discharge: 2019-06-27 | Disposition: A | Discharge status: Home / Self Care | Payer: Medicare Other | Attending: Internal Medicine | Admitting: Internal Medicine

## 2019-06-27 DIAGNOSIS — R1319 Other dysphagia: Secondary | ICD-10-CM

## 2019-06-27 DIAGNOSIS — Z85828 Personal history of other malignant neoplasm of skin: Secondary | ICD-10-CM | POA: Diagnosis not present

## 2019-06-27 DIAGNOSIS — K228 Other specified diseases of esophagus: Secondary | ICD-10-CM

## 2019-06-27 DIAGNOSIS — Z96653 Presence of artificial knee joint, bilateral: Secondary | ICD-10-CM | POA: Diagnosis not present

## 2019-06-27 DIAGNOSIS — K2100 Gastro-esophageal reflux disease with esophagitis, without bleeding: Secondary | ICD-10-CM | POA: Diagnosis not present

## 2019-06-27 DIAGNOSIS — Z8719 Personal history of other diseases of the digestive system: Secondary | ICD-10-CM | POA: Diagnosis not present

## 2019-06-27 DIAGNOSIS — Z79899 Other long term (current) drug therapy: Secondary | ICD-10-CM | POA: Insufficient documentation

## 2019-06-27 DIAGNOSIS — Z87891 Personal history of nicotine dependence: Secondary | ICD-10-CM | POA: Insufficient documentation

## 2019-06-27 DIAGNOSIS — E039 Hypothyroidism, unspecified: Secondary | ICD-10-CM | POA: Insufficient documentation

## 2019-06-27 DIAGNOSIS — Z79890 Hormone replacement therapy (postmenopausal): Secondary | ICD-10-CM | POA: Insufficient documentation

## 2019-06-27 DIAGNOSIS — K449 Diaphragmatic hernia without obstruction or gangrene: Secondary | ICD-10-CM | POA: Diagnosis not present

## 2019-06-27 DIAGNOSIS — R1310 Dysphagia, unspecified: Secondary | ICD-10-CM | POA: Diagnosis not present

## 2019-06-27 DIAGNOSIS — E785 Hyperlipidemia, unspecified: Secondary | ICD-10-CM | POA: Insufficient documentation

## 2019-06-27 DIAGNOSIS — N400 Enlarged prostate without lower urinary tract symptoms: Secondary | ICD-10-CM | POA: Insufficient documentation

## 2019-06-27 DIAGNOSIS — I10 Essential (primary) hypertension: Secondary | ICD-10-CM | POA: Diagnosis not present

## 2019-06-27 HISTORY — PX: BIOPSY: SHX5522

## 2019-06-27 HISTORY — PX: MALONEY DILATION: SHX5535

## 2019-06-27 HISTORY — PX: ESOPHAGOGASTRODUODENOSCOPY: SHX5428

## 2019-06-27 SURGERY — EGD (ESOPHAGOGASTRODUODENOSCOPY)
Anesthesia: Moderate Sedation

## 2019-06-27 MED ORDER — STERILE WATER FOR IRRIGATION IR SOLN
Status: DC | PRN
  Administered 2019-06-27: 2.5 mL

## 2019-06-27 MED ORDER — SODIUM CHLORIDE 0.9 % IV SOLN: INTRAVENOUS | Status: DC

## 2019-06-27 MED ORDER — ONDANSETRON HCL 4 MG/2ML IJ SOLN
INTRAMUSCULAR | Status: DC | PRN
  Administered 2019-06-27: 4 mg via INTRAVENOUS

## 2019-06-27 MED ORDER — MEPERIDINE HCL 50 MG/ML IJ SOLN
INTRAMUSCULAR | Status: AC
  Filled 2019-06-27: qty 1

## 2019-06-27 MED ORDER — LIDOCAINE VISCOUS HCL 2 % MT SOLN
OROMUCOSAL | Status: DC | PRN
  Administered 2019-06-27: 5 mL via OROMUCOSAL

## 2019-06-27 MED ORDER — MEPERIDINE HCL 100 MG/ML IJ SOLN
INTRAMUSCULAR | Status: DC | PRN
  Administered 2019-06-27: 25 mg via INTRAVENOUS
  Administered 2019-06-27: 15 mg via INTRAVENOUS

## 2019-06-27 MED ORDER — ONDANSETRON HCL 4 MG/2ML IJ SOLN
INTRAMUSCULAR | Status: AC
  Filled 2019-06-27: qty 2

## 2019-06-27 MED ORDER — MIDAZOLAM HCL 5 MG/5ML IJ SOLN
INTRAMUSCULAR | Status: AC
  Filled 2019-06-27: qty 10

## 2019-06-27 MED ORDER — MIDAZOLAM HCL 5 MG/5ML IJ SOLN
INTRAMUSCULAR | Status: AC
  Filled 2019-06-27: qty 5

## 2019-06-27 MED ORDER — MIDAZOLAM HCL 5 MG/5ML IJ SOLN
INTRAMUSCULAR | Status: DC | PRN
  Administered 2019-06-27 (×2): 2 mg via INTRAVENOUS

## 2019-06-27 MED ORDER — LIDOCAINE VISCOUS HCL 2 % MT SOLN
OROMUCOSAL | Status: AC
  Filled 2019-06-27: qty 15

## 2019-06-27 NOTE — Op Note (Signed)
[facility] Patient Name: [name] Procedure Date: 06/27/2019 8:56 AM MRN: [id number] Date of Birth: 07-Mar-1941 Attending MD: Norvel Richards , MD CSN: [id number] Age: 78 Admit Type: Outpatient Procedure:                Upper GI endoscopy Indications:              Dysphagia Providers:                Norvel Richards, MD, Jeanann Lewandowsky. Gwenlyn Perking RN, RN,                            Aram Candela Referring MD:              Medicines:                Midazolam 4 mg IV, Meperidine 40 mg IV, Ondansetron                            4 mg IV Complications:            No immediate complications. Estimated Blood Loss:     Estimated blood loss was minimal. Procedure:                Pre-Anesthesia Assessment:                           - Prior to the procedure, a History and Physical                            was performed, and patient medications and                            allergies were reviewed. The patient's tolerance of                            previous anesthesia was also reviewed. The risks                            and benefits of the procedure and the sedation                            options and risks were discussed with the patient.                            All questions were answered, and informed consent                            was obtained. Prior Anticoagulants: The patient has                            taken no previous anticoagulant or antiplatelet                            agents. ASA Grade Assessment: II - A patient with  mild systemic disease. After reviewing the risks                            and benefits, the patient was deemed in                            satisfactory condition to undergo the procedure.                           After obtaining informed consent, the endoscope was                            passed under direct vision. Throughout the                            procedure, the patient's blood pressure, pulse,  and                            oxygen saturations were monitored continuously. The                            [id number]-[id number] scope was introduced through the                            mouth, and advanced to the second part of duodenum.                            The upper GI endoscopy was accomplished without                            difficulty. The patient tolerated the procedure                            well. Scope In: 9:35:46 AM Scope Out: 9:41:41 AM Total Procedure Duration: 0 hours 5 minutes 55 seconds  Findings:      Salmon-colored mucosa was present. Couple of tiny islands of mucosa just       above the GE junction. 2 small skinny tongues within 1 cm of the GE       junction. No esophagitis. No nodularity. Tubular esophagus patent       throughout its course.      A small hiatal hernia was present.      The exam of the stomach was otherwise normal.      The duodenal bulb and second portion of the duodenum were normal. The       scope was withdrawn. Dilation was performed with a Maloney dilator with       mild resistance at 56 Fr. Dilation was performed with a Maloney dilator       with mild resistance at 28 Fr. The dilation site was examined following       endoscope reinsertion and showed no change. Estimated blood loss: none.       Finally, the abnormal appearing distal esophagus was biopsied. Impression:               - Salmon-colored mucosa. Dilated /subsequent biopsy                           -  Small hiatal hernia.                           - Normal duodenal bulb and second portion of the                            duodenum.                           - No specimens collected. Moderate Sedation:      Moderate (conscious) sedation was administered by the endoscopy nurse       and supervised by the endoscopist. The following parameters were       monitored: oxygen saturation, heart rate, blood pressure, respiratory       rate, EKG, adequacy of pulmonary ventilation,  and response to care.       Total physician intraservice time was 14 minutes. Recommendation:           - Patient has a contact number available for                            emergencies. The signs and symptoms of potential                            delayed complications were discussed with the                            patient. Return to normal activities tomorrow.                            Written discharge instructions were provided to the                            patient.                           - Resume previous diet.                           - Continue present medications. Continue omeprazole                            40 mg daily. Follow-up on pathology.                           - Return to my office in 1 year. Procedure Code(s):        --- Professional ---                           [phone], Esophagogastroduodenoscopy, flexible,                            transoral; diagnostic, including collection of                            specimen(s) by brushing or washing, when performed                            (  separate procedure)                           43450, Dilation of esophagus, by unguided sound or                            bougie, single or multiple passes                           G0500, Moderate sedation services provided by the                            same physician or other qualified health care                            professional performing a gastrointestinal                            endoscopic service that sedation supports,                            requiring the presence of an independent trained                            observer to assist in the monitoring of the                            patient's level of consciousness and physiological                            status; initial 15 minutes of intra-service time;                            patient age 36 years or older (additional time may                            be reported with [phone], as  appropriate) Diagnosis Code(s):        --- Professional ---                           K22.8, Other specified diseases of esophagus                           K44.9, Diaphragmatic hernia without obstruction or                            gangrene                           R13.10, Dysphagia, unspecified CPT copyright 2019 American Medical Association. All rights reserved. The codes documented in this report are preliminary and upon coder review may  be revised to meet current compliance requirements. [name]. Zayneb Baucum, MD Norvel Richards, MD 06/27/2019 10:02:10 AM This report has been signed electronically. Number of Addenda: 0

## 2019-06-27 NOTE — H&P (Signed)
@LOGO @   Primary Care Physician:  Michell Heinrich, DO Primary Gastroenterologist:  Dr. Gala Romney  Pre-Procedure History & Physical: HPI:  [name] is a 78 y.o. male here for recurrent esophageal dysphagia.  History of short segment Barrett's esophagus.  Reflux symptoms well controlled on omeprazole 40 mg at bedtime.  Here for EGD with dilation as feasible/appropriate.  Surveillance.  Past Medical History:  Diagnosis Date  . Arthritis    "fingers; knees; toes" (01/05/2014)  . Barrett's esophagus   . BPH (benign prostatic hyperplasia)   . Bradycardia 2009   Sinus rhythm in the 40s without symptoms;  Marland Kitchen GERD (gastroesophageal reflux disease)    ulcerative/erosive  . Gout   . Hemorrhoids   . Hiatal hernia   . History of skin cancer   . Hx of adenomatous colonic polyps   . Hyperlipidemia   . Hypertension   . Hypothyroidism    Following partial thyroidectomy  . Squamous carcinoma    skin cancer benign arms and right ear and rightside lip  . Tobacco abuse, in remission    10-20 pack years; discontinued in 1981    Past Surgical History:  Procedure Laterality Date  . ABDOMINAL HERNIA REPAIR  1990's  . BIOPSY  04/13/2018   Procedure: BIOPSY;  Surgeon: Daneil Dolin, MD;  Location: [facility];  Service: Endoscopy;;  . CATARACT EXTRACTION W/ INTRAOCULAR LENS  IMPLANT, BILATERAL Bilateral 2012-2014  . COLONOSCOPY  12/2005   normal  . COLONOSCOPY  2004   tubulovillous adenoma of rectum   . COLONOSCOPY  01/12/2011   Dr. Gala Romney- normal rectum  . COLONOSCOPY N/A 07/09/2015   Procedure: COLONOSCOPY;  Surgeon: Daneil Dolin, MD;  Location: [facility];  Service: Endoscopy;  Laterality: N/A;  730   . COLONOSCOPY N/A 01/04/2019   Procedure: COLONOSCOPY;  Surgeon: Daneil Dolin, MD;  Location: [facility];  Service: Endoscopy;  Laterality: N/A;  10:45am  . ELBOW ARTHROSCOPY Left 2014  . ESOPHAGEAL DILATION N/A 02/13/2015   Procedure: ESOPHAGEAL DILATION;  Surgeon: Daneil Dolin, MD;   [facility]: [facility];  Service: Endoscopy;  Laterality: N/A;  . ESOPHAGOGASTRODUODENOSCOPY  01/2006   geographic erosive RE, noncritical peptic stricture s/p dilation, antral erosion  . ESOPHAGOGASTRODUODENOSCOPY  01/12/2011   Dr. Gala Romney- hiatal hernia, distal esophageal erosions, barretts esophagus  . ESOPHAGOGASTRODUODENOSCOPY  01/27/2012   Dr.Bonnita Newby- short segment barretts esophagus, erosive reflux esophagitis. bx= barretts esophagus, negative for dysplasia  . ESOPHAGOGASTRODUODENOSCOPY N/A 02/13/2015   Dr.Kana Reimann- ulcerative/erosive reflux esophagitis, abnormal esophagitis c/w prior diagnosis of barretts esophagus, hiatal hernia. bx=barretts esophagus  . ESOPHAGOGASTRODUODENOSCOPY N/A 04/13/2018   Procedure: ESOPHAGOGASTRODUODENOSCOPY (EGD);  Surgeon: Daneil Dolin, MD;  [facility]: [facility];  Service: Endoscopy;  Laterality: N/A;  2:15pm  . HEMORRHOID SURGERY N/A 07/12/2015   Procedure: SIMPLE HEMORRHOIDECTOMY;  Surgeon: Aviva Signs, MD;  Location: [facility];  Service: General;  Laterality: N/A;  . [place]  01/12/2011   Procedure: [name] DILATION;  Surgeon: Daneil Dolin, MD;  Location: [facility];  Service: Endoscopy;  Laterality: N/A;  56 french  . MALONEY DILATION N/A 04/13/2018   Procedure: [name] DILATION;  Surgeon: Daneil Dolin, MD;  Location: [facility];  Service: Endoscopy;  Laterality: N/A;  . SQUAMOUS CELL CARCINOMA EXCISION     "cut off my hands; left arm; under right lip; right ear" (01/05/2014)  . THYROIDECTOMY, PARTIAL Right 1969   lobe-nodule present  . TOTAL KNEE  ARTHROPLASTY Left 01/05/2014   Procedure: LEFT TOTAL KNEE ARTHROPLASTY;  Surgeon: Mcarthur Rossetti, MD;  Location: [place];  Service: Orthopedics;  Laterality: Left;  . TOTAL KNEE ARTHROPLASTY Right 05/04/2014   Procedure: RIGHT TOTAL KNEE ARTHROPLASTY;  Surgeon: Mcarthur Rossetti, MD;  Location: [facility];  Service: Orthopedics;  Laterality: Right;  . UMBILICAL HERNIA REPAIR   2000    Prior to Admission medications   Medication Sig Start Date End Date Taking? Authorizing Provider  amLODipine (NORVASC) 10 MG tablet Take 10 mg by mouth every morning.    Yes [provider]  chlorthalidone (HYGROTON) 25 MG tablet Take 25 mg by mouth daily. 10/18/18  Yes [provider]  desonide (DESOWEN) 0.05 % ointment Apply 1 application topically daily as needed for rash. 01/24/19  Yes [provider]  hydrALAZINE (APRESOLINE) 25 MG tablet Take 1 tablet (25 mg total) 2 (two) times daily by mouth. 05/19/17  Yes Branch, Alphonse Guild, MD  levothyroxine (SYNTHROID) 137 MCG tablet Take 1 tablet (137 mcg total) by mouth daily before breakfast. 05/02/19  Yes Nida, Marella Chimes, MD  lisinopril (PRINIVIL,ZESTRIL) 40 MG tablet Take 40 mg by mouth daily.  07/24/15  Yes [provider]  METROCREAM 0.75 % cream Apply 1 application topically at bedtime. Apply to nose 05/25/19  Yes [provider]  omeprazole (PRILOSEC) 40 MG capsule Take 1 capsule (40 mg total) by mouth daily. Patient taking differently: Take 40 mg by mouth at bedtime.  04/26/19  Yes Darrin Apodaca, [name], MD  tamsulosin (FLOMAX) 0.4 MG CAPS capsule Take 0.4 mg by mouth at bedtime.  01/15/15  Yes [provider]  TOLAK 4 % CREA Apply 1 application topically at bedtime. Apply to forearms down to back of hands 05/25/19  Yes [provider]    Allergies as of 04/24/2019  . (No Known Allergies)    Family History  Problem Relation Age of Onset  . Lung cancer Father 36  . Colon polyps Brother   . Colon cancer Neg Hx   . Liver disease Neg Hx     Social History   Socioeconomic History  . Marital status: Married    Spouse name: Not on file  . Number of children: 2  . Years of education: Not on file  . Highest education level: Not on file  Occupational History  . Occupation: [employer]    Comment: Current employment  . Occupation: Engineer, civil (consulting)    Comment:  Prior employment  Tobacco Use  . Smoking status: Former Smoker    Packs/day: 1.00    Years: 20.00    Pack years: 20.00    Types: Cigarettes    Start date: 07/06/1956    Quit date: 07/06/1980    Years since quitting: 39.0  . Smokeless tobacco: Never Used  . Tobacco comment: "quit smoking in ~ 1982"  Substance and Sexual Activity  . Alcohol use: Yes    Alcohol/week: 1.0 standard drinks    Types: 1 Cans of beer per week    Comment: rare  . Drug use: No  . Sexual activity: Not on file  Other Topics Concern  . Not on file  Social History Narrative  . Not on file   Social Determinants of Health   Financial Resource Strain:   . Difficulty of Paying Living Expenses: Not on file  Food Insecurity:   . Worried About Charity fundraiser in the Last Year: Not on file  . Ran Out of Food  in the Last Year: Not on file  Transportation Needs:   . Lack of Transportation (Medical): Not on file  . Lack of Transportation (Non-Medical): Not on file  Physical Activity:   . Days of Exercise per Week: Not on file  . Minutes of Exercise per Session: Not on file  Stress:   . Feeling of Stress : Not on file  Social Connections:   . Frequency of Communication with Friends and Family: Not on file  . Frequency of Social Gatherings with Friends and Family: Not on file  . Attends Religious Services: Not on file  . Active Member of Clubs or Organizations: Not on file  . Attends Archivist Meetings: Not on file  . Marital Status: Not on file  Intimate Partner Violence:   . Fear of Current or Ex-Partner: Not on file  . Emotionally Abused: Not on file  . Physically Abused: Not on file  . Sexually Abused: Not on file    Review of Systems: See HPI, otherwise negative ROS  Physical Exam: BP (!) 142/81   Pulse 64   Temp 97.6 F (36.4 C) (Oral)   Resp 15   Ht 5\' 10"  (1.778 m)   Wt 117.5 kg   SpO2 100%   BMI 37.16 kg/m  General:   Alert,  Well-developed, well-nourished, pleasant and  cooperative in NAD Neck:  Supple; no masses or thyromegaly. No significant cervical adenopathy. Lungs:  Clear throughout to auscultation.   No wheezes, crackles, or rhonchi. No acute distress. Heart:  Regular rate and rhythm; no murmurs, clicks, rubs,  or gallops. Abdomen: Non-distended, normal bowel sounds.  Soft and nontender without appreciable mass or hepatosplenomegaly.  Pulses:  Normal pulses noted. Extremities:  Without clubbing or edema.  Impression/Plan: Recurrent esophageal dysphagia 78 year old gentleman short segment Barrett's esophagus.  [place] passed last year with improvement for about 1 year.  Here for repeat EGD with dilation as feasible/appropriate.  The risks, benefits, limitations, alternatives and imponderables have been reviewed with the patient. Potential for esophageal dilation, biopsy, etc. have also been reviewed.  Questions have been answered. All parties agreeable.     Notice: This dictation was prepared with Dragon dictation along with smaller phrase technology. Any transcriptional errors that result from this process are unintentional and may not be corrected upon review.

## 2019-06-27 NOTE — Discharge Instructions (Signed)

## 2019-06-28 ENCOUNTER — Encounter: Payer: Self-pay | Admitting: Internal Medicine

## 2019-06-28 LAB — SURGICAL PATHOLOGY

## 2019-10-01 ENCOUNTER — Other Ambulatory Visit: Payer: Self-pay | Admitting: "Endocrinology

## 2019-10-23 ENCOUNTER — Telehealth: Payer: Self-pay | Admitting: "Endocrinology

## 2019-10-23 LAB — T4, FREE: Free T4: 1.3 ng/dL (ref 0.8–1.8)

## 2019-10-23 LAB — TSH: TSH: 7.19 mIU/L — ABNORMAL HIGH (ref 0.40–4.50)

## 2019-10-23 NOTE — Telephone Encounter (Signed)
Pt said he went to do his labs-they told him that they couldn't do it bc there was something wrong with his vitamin D. Can you call [facility] and let patient know so he knows when to go back

## 2019-10-23 NOTE — Telephone Encounter (Signed)
Left a message with [facility] requesting a return call to the office.

## 2019-10-23 NOTE — Telephone Encounter (Signed)
Discussed with pt that he does not have a supporting diagnosis for a vitamin D level and that he would have to pay out of pocket if lab test performed. Understanding voiced.

## 2019-10-24 ENCOUNTER — Encounter: Payer: Self-pay | Admitting: "Endocrinology

## 2019-11-02 ENCOUNTER — Other Ambulatory Visit: Payer: Self-pay

## 2019-11-02 ENCOUNTER — Encounter: Payer: Self-pay | Admitting: "Endocrinology

## 2019-11-02 ENCOUNTER — Ambulatory Visit (INDEPENDENT_AMBULATORY_CARE_PROVIDER_SITE_OTHER): Payer: Medicare Other | Admitting: "Endocrinology

## 2019-11-02 VITALS — BP 148/80 | HR 80 | Ht 70.0 in | Wt 268.0 lb

## 2019-11-02 DIAGNOSIS — E559 Vitamin D deficiency, unspecified: Secondary | ICD-10-CM

## 2019-11-02 DIAGNOSIS — E890 Postprocedural hypothyroidism: Secondary | ICD-10-CM

## 2019-11-02 MED ORDER — LEVOTHYROXINE SODIUM 150 MCG PO TABS: 150.0000 ug | ORAL_TABLET | Freq: Every day | ORAL | 1 refills | Status: DC

## 2019-11-02 MED ORDER — VITAMIN D (ERGOCALCIFEROL) 1.25 MG (50000 UNIT) PO CAPS: 50000.0000 [IU] | ORAL_CAPSULE | ORAL | 0 refills | Status: DC

## 2019-11-02 NOTE — Progress Notes (Signed)
05/02/19      Endocrinology follow-up note   Subjective:    Patient ID: [name], male    DOB: 10/06/1940, PCP Michell Heinrich, DO   Past Medical History:  Diagnosis Date  . Arthritis    "fingers; knees; toes" (01/05/2014)  . Barrett's esophagus   . BPH (benign prostatic hyperplasia)   . Bradycardia 2009   Sinus rhythm in the 40s without symptoms;  Marland Kitchen GERD (gastroesophageal reflux disease)    ulcerative/erosive  . Gout   . Hemorrhoids   . Hiatal hernia   . History of skin cancer   . Hx of adenomatous colonic polyps   . Hyperlipidemia   . Hypertension   . Hypothyroidism    Following partial thyroidectomy  . Squamous carcinoma    skin cancer benign arms and right ear and rightside lip  . Tobacco abuse, in remission    10-20 pack years; discontinued in 1981   Past Surgical History:  Procedure Laterality Date  . ABDOMINAL HERNIA REPAIR  1990's  . BIOPSY  04/13/2018   Procedure: BIOPSY;  Surgeon: Daneil Dolin, MD;  Location: [facility];  Service: Endoscopy;;  . BIOPSY  06/27/2019   Procedure: BIOPSY;  Surgeon: Daneil Dolin, MD;  Location: [facility];  Service: Endoscopy;;  Distal Esophagus biopsy  . CATARACT EXTRACTION W/ INTRAOCULAR LENS  IMPLANT, BILATERAL Bilateral 2012-2014  . COLONOSCOPY  12/2005   normal  . COLONOSCOPY  2004   tubulovillous adenoma of rectum   . COLONOSCOPY  01/12/2011   Dr. Gala Romney- normal rectum  . COLONOSCOPY N/A 07/09/2015   Procedure: COLONOSCOPY;  Surgeon: Daneil Dolin, MD;  Location: [facility];  Service: Endoscopy;  Laterality: N/A;  730   . COLONOSCOPY N/A 01/04/2019   Procedure: COLONOSCOPY;  Surgeon: Daneil Dolin, MD;  Location: [facility];  Service: Endoscopy;  Laterality: N/A;  10:45am  . ELBOW ARTHROSCOPY Left 2014  . ESOPHAGEAL DILATION N/A 02/13/2015   Procedure: ESOPHAGEAL DILATION;  Surgeon: Daneil Dolin, MD;  [facility]: [facility];  Service: Endoscopy;  Laterality: N/A;  . ESOPHAGOGASTRODUODENOSCOPY   01/2006   geographic erosive RE, noncritical peptic stricture s/p dilation, antral erosion  . ESOPHAGOGASTRODUODENOSCOPY  01/12/2011   Dr. Gala Romney- hiatal hernia, distal esophageal erosions, barretts esophagus  . ESOPHAGOGASTRODUODENOSCOPY  01/27/2012   Dr.Rourk- short segment barretts esophagus, erosive reflux esophagitis. bx= barretts esophagus, negative for dysplasia  . ESOPHAGOGASTRODUODENOSCOPY N/A 02/13/2015   Dr.Rourk- ulcerative/erosive reflux esophagitis, abnormal esophagitis c/w prior diagnosis of barretts esophagus, hiatal hernia. bx=barretts esophagus  . ESOPHAGOGASTRODUODENOSCOPY N/A 04/13/2018   Procedure: ESOPHAGOGASTRODUODENOSCOPY (EGD);  Surgeon: Daneil Dolin, MD;  [facility]: [facility];  Service: Endoscopy;  Laterality: N/A;  2:15pm  . ESOPHAGOGASTRODUODENOSCOPY N/A 06/27/2019   Procedure: ESOPHAGOGASTRODUODENOSCOPY (EGD);  Surgeon: Daneil Dolin, MD;  [facility]: [facility];  Service: Endoscopy;  Laterality: N/A;  11:15am  . HEMORRHOID SURGERY N/A 07/12/2015   Procedure: SIMPLE HEMORRHOIDECTOMY;  Surgeon: Aviva Signs, MD;  Location: [facility];  Service: General;  Laterality: N/A;  . [place]  01/12/2011   Procedure: [name] DILATION;  Surgeon: Daneil Dolin, MD;  [facility]: [facility];  Service: Endoscopy;  Laterality: N/A;  56 french  . MALONEY DILATION N/A 04/13/2018   Procedure: [name] DILATION;  Surgeon: Daneil Dolin, MD;  [facility]: [facility];  Service: Endoscopy;  Laterality: N/A;  . MALONEY DILATION N/A 06/27/2019   Procedure: [name] DILATION;  Surgeon:  Rourk, [name], MD;  Location: [facility];  Service: Endoscopy;  Laterality: N/A;  . SQUAMOUS CELL CARCINOMA EXCISION     "cut off my hands; left arm; under right lip; right ear" (01/05/2014)  . THYROIDECTOMY, PARTIAL Right 1969   lobe-nodule present  . TOTAL KNEE ARTHROPLASTY Left 01/05/2014   Procedure: LEFT TOTAL KNEE ARTHROPLASTY;  Surgeon: Mcarthur Rossetti, MD;  Location: [facility];  Service: Orthopedics;  Laterality: Left;  . TOTAL KNEE ARTHROPLASTY Right 05/04/2014   Procedure: RIGHT TOTAL KNEE ARTHROPLASTY;  Surgeon: Mcarthur Rossetti, MD;  Location: [facility];  Service: Orthopedics;  Laterality: Right;  . UMBILICAL HERNIA REPAIR  2000   Social History   Socioeconomic History  . Marital status: Married    Spouse name: Not on file  . Number of children: 2  . Years of education: Not on file  . Highest education level: Not on file  Occupational History  . Occupation: [employer]    Comment: Current employment  . Occupation: Engineer, civil (consulting)    Comment: Prior employment  Tobacco Use  . Smoking status: Former Smoker    Packs/day: 1.00    Years: 20.00    Pack years: 20.00    Types: Cigarettes    Start date: 07/06/1956    Quit date: 07/06/1980    Years since quitting: 39.3  . Smokeless tobacco: Never Used  . Tobacco comment: "quit smoking in ~ 1982"  Substance and Sexual Activity  . Alcohol use: Yes    Alcohol/week: 1.0 standard drinks    Types: 1 Cans of beer per week    Comment: rare  . Drug use: No  . Sexual activity: Not on file  Other Topics Concern  . Not on file  Social History Narrative  . Not on file   Social Determinants of Health   Financial Resource Strain:   . Difficulty of Paying Living Expenses:   Food Insecurity:   . Worried About Charity fundraiser in the Last Year:   . Arboriculturist in the Last Year:   Transportation Needs:   . Film/video editor (Medical):   Marland Kitchen Lack of Transportation (Non-Medical):   Physical Activity:   . Days of Exercise per Week:   . Minutes of Exercise per Session:   Stress:   . Feeling of Stress :   Social Connections:   . Frequency of Communication with Friends and Family:   . Frequency of Social Gatherings with Friends and Family:   . Attends Religious Services:   . Active Member of Clubs or Organizations:   . Attends Archivist Meetings:   Marland Kitchen Marital Status:     Outpatient Encounter Medications as of 11/02/2019  Medication Sig  . amLODipine (NORVASC) 10 MG tablet Take 10 mg by mouth every morning.   . chlorthalidone (HYGROTON) 25 MG tablet Take 25 mg by mouth daily.  Marland Kitchen desonide (DESOWEN) 0.05 % ointment Apply 1 application topically daily as needed for rash.  . hydrALAZINE (APRESOLINE) 25 MG tablet Take 1 tablet (25 mg total) 2 (two) times daily by mouth.  . levothyroxine (SYNTHROID) 150 MCG tablet Take 1 tablet (150 mcg total) by mouth daily before breakfast.  . lisinopril (PRINIVIL,ZESTRIL) 40 MG tablet Take 40 mg by mouth daily.   Marland Kitchen METROCREAM 0.75 % cream Apply 1 application topically at bedtime. Apply to nose  . omeprazole (PRILOSEC) 40 MG capsule Take 1 capsule (40 mg total) by mouth daily. (Patient taking differently: Take  40 mg by mouth at bedtime. )  . tamsulosin (FLOMAX) 0.4 MG CAPS capsule Take 0.4 mg by mouth at bedtime.   . TOLAK 4 % CREA Apply 1 application topically at bedtime. Apply to forearms down to back of hands  . Vitamin D, Ergocalciferol, (DRISDOL) 1.25 MG (50000 UNIT) CAPS capsule Take 1 capsule (50,000 Units total) by mouth every 7 (seven) days.  . [DISCONTINUED] levothyroxine (SYNTHROID) 137 MCG tablet TAKE 1 TABLET (137 MCG TOTAL) BY MOUTH DAILY BEFORE BREAKFAST.   No facility-administered encounter medications on file as of 11/02/2019.   ALLERGIES: No Known Allergies  VACCINATION STATUS:  There is no immunization history on file for this patient.  HPI 79 year old male patient with medical history as above. He is being seen in follow-up for postsurgical hypothyroidism.   He underwent what appears to be partial thyroidectomy for benign pathology in 1969. He was treated with various doses of levothyroxine over the years, currently on levothyroxine 137 mcg p.o. daily before breakfast.    He reports compliance to this medication. He  takes it orally in the morning with water.  His previsit thyroid function tests are  consistent with slight under replacement.   He has family history of thyroid dysfunction.  He denies family history of thyroid cancer.  -He denies any new complaints today.  He denies palpitations, tremors, heat/cold intolerance.  -He has a steady weight. - He denies exposure to neck radiation. -He denies dysphagia, shortness of breath, change in his voice.    Review of Systems Limited as above.   Objective:    BP (!) 148/80   Pulse 80   Ht 5\' 10"  (1.778 m)   Wt 268 lb (121.6 kg)   BMI 38.45 kg/m   Wt Readings from Last 3 Encounters:  11/02/19 268 lb (121.6 kg)  06/27/19 259 lb (117.5 kg)  04/21/19 259 lb (117.5 kg)    Physical Exam   CMP ( most recent) CMP     Component Value Date/Time   NA 141 07/10/2015 1100   K 4.1 07/10/2015 1100   CL 105 07/10/2015 1100   CO2 28 07/10/2015 1100   GLUCOSE 100 (H) 07/10/2015 1100   BUN 17 07/10/2015 1100   CREATININE 1.21 07/10/2015 1100   CREATININE 1.14 08/31/2012 1149   CALCIUM 9.8 07/10/2015 1100   PROT 6.8 08/31/2012 1149   ALBUMIN 4.5 08/31/2012 1149   AST 19 08/31/2012 1149   ALT 22 08/31/2012 1149   ALKPHOS 55 08/31/2012 1149   BILITOT 0.6 08/31/2012 1149   GFRNONAA 57 (L) 07/10/2015 1100   GFRAA >60 07/10/2015 1100     Lipid Panel ( most recent) Lipid Panel     Component Value Date/Time   CHOL 130 04/07/2018 0000   TRIG 175 (A) 04/07/2018 0000   HDL 30 (A) 04/07/2018 0000   CHOLHDL 6.4 08/31/2012 1149   VLDL 52 (H) 08/31/2012 1149   LDLCALC 73 04/07/2018 0000   Recent Results (from the past 2160 hour(s))  T4, free     Status: None   Collection Time: 10/23/19  3:51 PM  Result Value Ref Range   Free T4 1.3 0.8 - 1.8 ng/dL  TSH     Status: Abnormal   Collection Time: 10/23/19  3:51 PM  Result Value Ref Range   TSH 7.19 (H) 0.40 - 4.50 mIU/L     Labs from April 07, 2018: Total T4 14.2 high, free T4 4.4 high, TSH 0.22 suppressed Labs from 09/28/2016 showed TSH 5.39  elevated, free T3 3 0.3, total T4  11.3. 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  Assessment & Plan:   1. Postsurgical hypothyroidism -His recent thyroid function tests are consistent with slight under replacement.  He is approached with a higher dose of levothyroxine, 150 mcg daily before breakfast.    - We discussed about the correct intake of his thyroid hormone, on empty stomach at fasting, with water, separated by at least 30 minutes from breakfast and other medications,  and separated by more than 4 hours from calcium, iron, multivitamins, acid reflux medications (PPIs). -Patient is made aware of the fact that thyroid hormone replacement is needed for life, dose to be adjusted by periodic monitoring of thyroid function tests.   2. History of goiter status post partial thyroidectomy: - His repeat thyroid/neck ultrasound from 03/09/2017 is remarkable for surgical absent right lobe, 2.9 cm remnant left lobe with no discrete nodules or lesions. He will not need any intervention.   3.  Vitamin D deficiency -He would benefit from prescription strength vitamin D.  I discussed and prescribed vitamin D2 50,000 units weekly x12 weeks. - I advised patient to maintain close follow up with Michell Heinrich, DO for primary care needs.     - Time spent on this patient care encounter:  25 minutes of which 50% was spent in  counseling and the rest reviewing  his current and  previous labs / studies and medications  doses and developing a plan for long term care. Cottie Banda Redlich  participated in the discussions, expressed understanding, and voiced agreement with the above plans.  All questions were answered to his satisfaction. he is encouraged to contact clinic should he have any questions or concerns prior to his return visit.   Follow up plan: Return in about 6 months (around 05/03/2020) for Follow up with Pre-visit Labs.  Glade Lloyd, MD Phone: [phone]   Fax: [phone]   This note was partially dictated with voice recognition software. Similar sounding words can be transcribed inadequately or may not  be corrected upon review. 11/02/2019, 2:00 PM

## 2019-12-25 ENCOUNTER — Other Ambulatory Visit: Payer: Self-pay | Admitting: "Endocrinology

## 2020-01-18 ENCOUNTER — Other Ambulatory Visit: Payer: Self-pay | Admitting: "Endocrinology

## 2020-04-29 ENCOUNTER — Other Ambulatory Visit: Payer: Self-pay | Admitting: Internal Medicine

## 2020-04-29 DIAGNOSIS — K2100 Gastro-esophageal reflux disease with esophagitis, without bleeding: Secondary | ICD-10-CM

## 2020-04-29 DIAGNOSIS — K2270 Barrett's esophagus without dysplasia: Secondary | ICD-10-CM

## 2020-04-29 DIAGNOSIS — D126 Benign neoplasm of colon, unspecified: Secondary | ICD-10-CM

## 2020-04-30 LAB — TSH: TSH: 7.02 mIU/L — ABNORMAL HIGH (ref 0.40–4.50)

## 2020-04-30 LAB — T4, FREE: Free T4: 1.3 ng/dL (ref 0.8–1.8)

## 2020-04-30 LAB — VITAMIN D 25 HYDROXY (VIT D DEFICIENCY, FRACTURES): Vit D, 25-Hydroxy: 34 ng/mL (ref 30–100)

## 2020-05-07 ENCOUNTER — Other Ambulatory Visit: Payer: Self-pay

## 2020-05-07 ENCOUNTER — Ambulatory Visit (INDEPENDENT_AMBULATORY_CARE_PROVIDER_SITE_OTHER): Payer: Medicare Other | Admitting: Nurse Practitioner

## 2020-05-07 ENCOUNTER — Encounter: Payer: Self-pay | Admitting: Nurse Practitioner

## 2020-05-07 VITALS — BP 132/74 | HR 98 | Ht 70.0 in | Wt 262.0 lb

## 2020-05-07 DIAGNOSIS — E890 Postprocedural hypothyroidism: Secondary | ICD-10-CM

## 2020-05-07 DIAGNOSIS — R197 Diarrhea, unspecified: Secondary | ICD-10-CM

## 2020-05-07 DIAGNOSIS — E559 Vitamin D deficiency, unspecified: Secondary | ICD-10-CM | POA: Diagnosis not present

## 2020-05-07 DIAGNOSIS — Z79899 Other long term (current) drug therapy: Secondary | ICD-10-CM

## 2020-05-07 MED ORDER — LEVOTHYROXINE SODIUM 175 MCG PO TABS: 175.0000 ug | ORAL_TABLET | Freq: Every day | ORAL | 3 refills | Status: DC

## 2020-05-07 NOTE — Progress Notes (Signed)
Referring Provider: Michell Heinrich, DO Primary Care Physician:  Michell Heinrich, DO Primary GI:  Dr. Gala Romney  Chief Complaint  Patient presents with  . Diarrhea    >53month; occurs daily; has been 4x since 0500    HPI:   [name] is a 79 y.o. male who presents for diarrhea.  The patient is often seen in our office since 04/21/2019 when he was seen for GERD, Barrett's esophagus, esophageal dysphagia.  At that time noted chronic history of GERD and short segment Barrett's esophagus, currently well managed on omeprazole 40 mg daily with recurrent dysphagia.  Due to recurrent dysphagia he was offered a repeat EGD with dilation.  Recommend he continue omeprazole 40 mg daily.    EGD was completed 06/27/2019 which found; mucosa of the esophagus status post biopsy and status post dilation, small hiatal hernia, normal duodenum.  Surgical pathology found the biopsies to be consistent with reflux.  Recommended continue PPI.  Today he states he is doing okay overall. States his diarrhea started about a month ago, was gradual in onset and gotten progressively worse. Stools float. Has had 4 stools already today; typically 5+ stools a day. Stools are half-and-half loose and watery. Gelatinous in consistency. Denies hematochezia, melena. Denies history of pancreatitis. Rare nausea, no vomiting. Denies fever, chills, unintentional weight loss. Drinking plenty of water. Appetite good. Doesn't eat excessive diary (no changes in amount). Denies URI or flu-like symptoms. Denies loss of sense of taste or smell. The patient has received COVID-19 vaccination(s). Denies chest pain, dyspnea, dizziness, lightheadedness, syncope, near syncope. Denies any other upper or lower GI symptoms.  Saw UC in [place], [place] who gave him 10 pills of lomotil which ehlped at first, but less as time went on; now out.  Sick contacts: No HC exposure: No Water Source: City; bottled Travel: No New medications: Allopurinol,  chlorthaladone (no significant diarrhea ADEs on UTD) New foods/diet: No GB: in situ Hx panc: No  Past Medical History:  Diagnosis Date  . Arthritis    "fingers; knees; toes" (01/05/2014)  . Barrett's esophagus   . BPH (benign prostatic hyperplasia)   . Bradycardia 2009   Sinus rhythm in the 40s without symptoms;  Marland Kitchen GERD (gastroesophageal reflux disease)    ulcerative/erosive  . Gout   . Hemorrhoids   . Hiatal hernia   . History of skin cancer   . Hx of adenomatous colonic polyps   . Hyperlipidemia   . Hypertension   . Hypothyroidism    Following partial thyroidectomy  . Squamous carcinoma    skin cancer benign arms and right ear and rightside lip  . Tobacco abuse, in remission    10-20 pack years; discontinued in 1981    Past Surgical History:  Procedure Laterality Date  . ABDOMINAL HERNIA REPAIR  1990's  . BIOPSY  04/13/2018   Procedure: BIOPSY;  Surgeon: Daneil Dolin, MD;  [facility];  Service: Endoscopy;;  . BIOPSY  06/27/2019   Procedure: BIOPSY;  Surgeon: Daneil Dolin, MD;  [facility];  Service: Endoscopy;;  Distal Esophagus biopsy  . CATARACT EXTRACTION W/ INTRAOCULAR LENS  IMPLANT, BILATERAL Bilateral 2012-2014  . COLONOSCOPY  12/2005   normal  . COLONOSCOPY  2004   tubulovillous adenoma of rectum   . COLONOSCOPY  01/12/2011   Dr. Gala Romney- normal rectum  . COLONOSCOPY N/A 07/09/2015   Procedure: COLONOSCOPY;  Surgeon: Daneil Dolin, MD;  [facility];  Service: Endoscopy;  Laterality: N/A;  730   . COLONOSCOPY N/A 01/04/2019   Procedure: COLONOSCOPY;  Surgeon: Daneil Dolin, MD;  [facility];  Service: Endoscopy;  Laterality: N/A;  10:45am  . ELBOW ARTHROSCOPY Left 2014  . ESOPHAGEAL DILATION N/A 02/13/2015   Procedure: ESOPHAGEAL DILATION;  Surgeon: Daneil Dolin, MD;  [facility];  Service: Endoscopy;  Laterality: N/A;  . ESOPHAGOGASTRODUODENOSCOPY  01/2006   geographic erosive RE, noncritical  peptic stricture s/p dilation, antral erosion  . ESOPHAGOGASTRODUODENOSCOPY  01/12/2011   Dr. Gala Romney- hiatal hernia, distal esophageal erosions, barretts esophagus  . ESOPHAGOGASTRODUODENOSCOPY  01/27/2012   Dr.Rourk- short segment barretts esophagus, erosive reflux esophagitis. bx= barretts esophagus, negative for dysplasia  . ESOPHAGOGASTRODUODENOSCOPY N/A 02/13/2015   Dr.Rourk- ulcerative/erosive reflux esophagitis, abnormal esophagitis c/w prior diagnosis of barretts esophagus, hiatal hernia. bx=barretts esophagus  . ESOPHAGOGASTRODUODENOSCOPY N/A 04/13/2018   Procedure: ESOPHAGOGASTRODUODENOSCOPY (EGD);  Surgeon: Daneil Dolin, MD;  [facility];  Service: Endoscopy;  Laterality: N/A;  2:15pm  . ESOPHAGOGASTRODUODENOSCOPY N/A 06/27/2019   Procedure: ESOPHAGOGASTRODUODENOSCOPY (EGD);  Surgeon: Daneil Dolin, MD;  [facility];  Service: Endoscopy;  Laterality: N/A;  11:15am  . HEMORRHOID SURGERY N/A 07/12/2015   Procedure: SIMPLE HEMORRHOIDECTOMY;  Surgeon: Aviva Signs, MD;  Location[facility];  Service: General;  Laterality: N/A;  . [place]  01/12/2011   Procedure: [name] DILATION;  Surgeon: Daneil Dolin, MD;  [facility];  Service: Endoscopy;  Laterality: N/A;  56 french  . MALONEY DILATION N/A 04/13/2018   Procedure: [name] DILATION;  Surgeon: Daneil Dolin, MD;  [facility];  Service: Endoscopy;  Laterality: N/A;  [name] DILATION N/A 06/27/2019   Procedure: [name] DILATION;  Surgeon: Daneil Dolin, MD;  [facility];  Service: Endoscopy;  Laterality: N/A;  . SQUAMOUS CELL CARCINOMA EXCISION     "cut off my hands; left arm; under right lip; right ear" (01/05/2014)  . THYROIDECTOMY, PARTIAL Right 1969   lobe-nodule present  . TOTAL KNEE ARTHROPLASTY Left 01/05/2014   Procedure: LEFT TOTAL KNEE ARTHROPLASTY;  Surgeon: Mcarthur Rossetti, MD;  Location: [facility];  Service: Orthopedics;  Laterality:  Left;  . TOTAL KNEE ARTHROPLASTY Right 05/04/2014   Procedure: RIGHT TOTAL KNEE ARTHROPLASTY;  Surgeon: Mcarthur Rossetti, MD;  Location: [facility];  Service: Orthopedics;  Laterality: Right;  . UMBILICAL HERNIA REPAIR  2000    Current Outpatient Medications  Medication Sig Dispense Refill  . allopurinol (ZYLOPRIM) 100 MG tablet Take 100 mg by mouth daily.    Marland Kitchen amLODipine (NORVASC) 10 MG tablet Take 10 mg by mouth every morning.     Marland Kitchen atorvastatin (LIPITOR) 40 MG tablet Take 40 mg by mouth at bedtime.    . chlorthalidone (HYGROTON) 25 MG tablet Take 25 mg by mouth daily.    Marland Kitchen desonide (DESOWEN) 0.05 % ointment Apply 1 application topically daily as needed for rash.    . fluorouracil (EFUDEX) 5 % cream Apply 1 application topically 2 (two) times daily.    . hydrALAZINE (APRESOLINE) 25 MG tablet Take 1 tablet (25 mg total) 2 (two) times daily by mouth. 60 tablet 6  . levothyroxine (SYNTHROID) 175 MCG tablet Take 1 tablet (175 mcg total) by mouth daily before breakfast. 90 tablet 3  . lisinopril (PRINIVIL,ZESTRIL) 40 MG tablet Take 40 mg by mouth daily.     Marland Kitchen METROCREAM 0.75 % cream Apply 1 application topically  at bedtime. Apply to nose    . omeprazole (PRILOSEC) 40 MG capsule TAKE 1 CAPSULE BY MOUTH EVERY DAY 90 capsule 3  . tamsulosin (FLOMAX) 0.4 MG CAPS capsule Take 0.4 mg by mouth at bedtime.     . TOLAK 4 % CREA Apply 1 application topically at bedtime. Apply to forearms down to back of hands    . Vitamin D, Ergocalciferol, (DRISDOL) 1.25 MG (50000 UNIT) CAPS capsule Take 1 capsule (50,000 Units total) by mouth every 7 (seven) days. 12 capsule 0   No current facility-administered medications for this visit.    Allergies as of 05/07/2020  . (No Known Allergies)    Family History  Problem Relation Age of Onset  . Lung cancer Father 71  . Colon polyps Brother   . Colon cancer Neg Hx   . Liver disease Neg Hx     Social History   Socioeconomic History  . Marital status:  Married    Spouse name: Not on file  . Number of children: 2  . Years of education: Not on file  . Highest education level: Not on file  Occupational History  . Occupation: [employer]    Comment: Current employment  . Occupation: Engineer, civil (consulting)    Comment: Prior employment  Tobacco Use  . Smoking status: Former Smoker    Packs/day: 1.00    Years: 20.00    Pack years: 20.00    Types: Cigarettes    Start date: 07/06/1956    Quit date: 07/06/1980    Years since quitting: 39.8  . Smokeless tobacco: Never Used  . Tobacco comment: "quit smoking in ~ 1982"  Vaping Use  . Vaping Use: Never used  Substance and Sexual Activity  . Alcohol use: Yes    Comment: occ beer  . Drug use: No  . Sexual activity: Not on file  Other Topics Concern  . Not on file  Social History Narrative  . Not on file   Social Determinants of Health   Financial Resource Strain:   . Difficulty of Paying Living Expenses: Not on file  Food Insecurity:   . Worried About Charity fundraiser in the Last Year: Not on file  . Ran Out of Food in the Last Year: Not on file  Transportation Needs:   . Lack of Transportation (Medical): Not on file  . Lack of Transportation (Non-Medical): Not on file  Physical Activity:   . Days of Exercise per Week: Not on file  . Minutes of Exercise per Session: Not on file  Stress:   . Feeling of Stress : Not on file  Social Connections:   . Frequency of Communication with Friends and Family: Not on file  . Frequency of Social Gatherings with Friends and Family: Not on file  . Attends Religious Services: Not on file  . Active Member of Clubs or Organizations: Not on file  . Attends Archivist Meetings: Not on file  . Marital Status: Not on file    Subjective: Review of Systems  Constitutional: Negative for chills, fever, malaise/fatigue and weight loss.  HENT: Negative for congestion and sore throat.   Respiratory: Negative for cough and shortness of  breath.   Cardiovascular: Negative for chest pain and palpitations.  Gastrointestinal: Positive for diarrhea and nausea. Negative for abdominal pain, blood in stool, melena and vomiting.  Musculoskeletal: Negative for joint pain and myalgias.  Skin: Negative for rash.  Neurological: Negative for dizziness and weakness.  Endo/Heme/Allergies: Does  not bruise/bleed easily.  Psychiatric/Behavioral: Negative for depression. The patient is not nervous/anxious.   All other systems reviewed and are negative.    Objective: BP 121/70   Pulse 89   Temp (!) 96.8 F (36 C) (Temporal)   Ht 5\' 10"  (1.778 m)   Wt 261 lb 9.6 oz (118.7 kg)   BMI 37.54 kg/m  Physical Exam Vitals and nursing note reviewed.  Constitutional:      General: He is not in acute distress.    Appearance: Normal appearance. He is obese. He is not ill-appearing, toxic-appearing or diaphoretic.  HENT:     Head: Normocephalic and atraumatic.     Nose: No congestion or rhinorrhea.  Eyes:     General: No scleral icterus. Cardiovascular:     Rate and Rhythm: Normal rate and regular rhythm.     Heart sounds: Normal heart sounds.  Pulmonary:     Effort: Pulmonary effort is normal.     Breath sounds: Normal breath sounds.  Abdominal:     General: Bowel sounds are normal. There is no distension.     Palpations: Abdomen is soft. There is no hepatomegaly, splenomegaly or mass.     Tenderness: There is no abdominal tenderness. There is no guarding or rebound.     Hernia: No hernia is present.  Musculoskeletal:     Cervical back: Neck supple.  Skin:    General: Skin is warm and dry.     Coloration: Skin is not jaundiced.     Findings: No bruising or rash.  Neurological:     General: No focal deficit present.     Mental Status: He is alert and oriented to person, place, and time. Mental status is at baseline.  Psychiatric:        Mood and Affect: Mood normal.        Behavior: Behavior normal.        Thought Content:  Thought content normal.      Assessment:  Very pleasant 79 year old male presents for new onset diarrhea.  Colonoscopy completed in 2020 and reassuring.  States it started about a month ago, gradual in onset.  He is on 2 recently new medications including chlorthalidone and allopurinol but no significant known diarrhea ADEs are identified.  Recently completed TSH was actually elevated and likely not contributory.  No well water.  No recent healthcare exposures.  At this point I will check for multiple possible etiologies including colonic infection, exocrine pancreatic insufficiency, celiac disease.  Further recommendations will follow.  Lomotil previously worked initially but he was on a prolonged course and is now out.  In general I will probably start him on a probiotic to help.   Plan: 1. Have primary care back CBC metabolic panel (CMP versus BMP) that was completed last week 2. Stool for C. difficile, GI pathogen panel 3. Pancreatic fecal elastase 4. Celiac panel 5. Consider probiotic depending on results 6. Can also try Bentyl and/or Imodium for symptomatic management if no infection 7. Follow-up in 6 weeks    Thank you for allowing [place] to participate in the care of [place], DNP, AGNP-C Adult & Gerontological Nurse Practitioner [facility]   05/07/2020 1:59 PM   Disclaimer: This note was dictated with voice recognition software. Similar sounding words can inadvertently be transcribed and may not be corrected upon review.

## 2020-05-07 NOTE — Patient Instructions (Signed)
Your health issues we discussed today were:   Diarrhea: 1. As you discussed, make sure you are drinking plenty of water 2. You will need to go to the lab ([facility]) and get a stool collection kit 3. Bring your stool sample back to the lab.  They must be liquid stools for the test to be completed 4. When you bring your samples back you can have your blood work completed 5. We will call you with the results 6. Depending on what we find, we can make recommendations for medication 7. I will probably start you on a probiotic regardless of your results. We've had good success with Align, Restora, Culturelle, [employer], and [employer]; however there are many good options and you can discuss with the pharmacist if you would like further guidance.  These are over-the-counter medications.   47. [employer] has recently released a store brand that is equivalent to Levant but substantially cheaper 9. Because if you have any worsening or severe symptoms  Overall I recommend:  1. Continue your other current medications 2. Return for follow-up in 6 weeks 3. Call us for any questions or concerns   ---------------------------------------------------------------  I am glad you have gotten your COVID-19 vaccination!  Even though you are fully vaccinated you should continue to follow CDC and state/local guidelines.  ---------------------------------------------------------------   At [facility] we value your feedback. You may receive a survey about your visit today. Please share your experience as we strive to create trusting relationships with our patients to provide genuine, compassionate, quality care.  We appreciate your understanding and patience as we review any laboratory studies, imaging, and other diagnostic tests that are ordered as we care for you. Our office policy is 5 business days for review of these results, and any emergent or urgent results are addressed in a timely  manner for your best interest. If you do not hear from our office in 1 week, please contact us.   We also encourage the use of MyChart, which contains your medical information for your review as well. If you are not enrolled in this feature, an access code is on this after visit summary for your convenience. Thank you for allowing [place] to be involved in your care.  It was great to see you today!  I hope you have a Happy Thanksgiving!!

## 2020-05-07 NOTE — Progress Notes (Signed)
05/02/19      Endocrinology follow-up note   Subjective:    Patient ID: [name], male    DOB: 10/06/1940, PCP Michell Heinrich, DO   Past Medical History:  Diagnosis Date  . Arthritis    "fingers; knees; toes" (01/05/2014)  . Barrett's esophagus   . BPH (benign prostatic hyperplasia)   . Bradycardia 2009   Sinus rhythm in the 40s without symptoms;  Marland Kitchen GERD (gastroesophageal reflux disease)    ulcerative/erosive  . Gout   . Hemorrhoids   . Hiatal hernia   . History of skin cancer   . Hx of adenomatous colonic polyps   . Hyperlipidemia   . Hypertension   . Hypothyroidism    Following partial thyroidectomy  . Squamous carcinoma    skin cancer benign arms and right ear and rightside lip  . Tobacco abuse, in remission    10-20 pack years; discontinued in 1981   Past Surgical History:  Procedure Laterality Date  . ABDOMINAL HERNIA REPAIR  1990's  . BIOPSY  04/13/2018   Procedure: BIOPSY;  Surgeon: Daneil Dolin, MD;  [facility]: [facility];  Service: Endoscopy;;  . BIOPSY  06/27/2019   Procedure: BIOPSY;  Surgeon: Daneil Dolin, MD;  Location: [facility];  Service: Endoscopy;;  Distal Esophagus biopsy  . CATARACT EXTRACTION W/ INTRAOCULAR LENS  IMPLANT, BILATERAL Bilateral 2012-2014  . COLONOSCOPY  12/2005   normal  . COLONOSCOPY  2004   tubulovillous adenoma of rectum   . COLONOSCOPY  01/12/2011   Dr. Gala Romney- normal rectum  . COLONOSCOPY N/A 07/09/2015   Procedure: COLONOSCOPY;  Surgeon: Daneil Dolin, MD;  [facility]: [facility];  Service: Endoscopy;  Laterality: N/A;  730   . COLONOSCOPY N/A 01/04/2019   Procedure: COLONOSCOPY;  Surgeon: Daneil Dolin, MD;  [facility]: [facility];  Service: Endoscopy;  Laterality: N/A;  10:45am  . ELBOW ARTHROSCOPY Left 2014  . ESOPHAGEAL DILATION N/A 02/13/2015   Procedure: ESOPHAGEAL DILATION;  Surgeon: Daneil Dolin, MD;  [facility];  Service: Endoscopy;  Laterality: N/A;  . ESOPHAGOGASTRODUODENOSCOPY   01/2006   geographic erosive RE, noncritical peptic stricture s/p dilation, antral erosion  . ESOPHAGOGASTRODUODENOSCOPY  01/12/2011   Dr. Gala Romney- hiatal hernia, distal esophageal erosions, barretts esophagus  . ESOPHAGOGASTRODUODENOSCOPY  01/27/2012   Dr.Rourk- short segment barretts esophagus, erosive reflux esophagitis. bx= barretts esophagus, negative for dysplasia  . ESOPHAGOGASTRODUODENOSCOPY N/A 02/13/2015   Dr.Rourk- ulcerative/erosive reflux esophagitis, abnormal esophagitis c/w prior diagnosis of barretts esophagus, hiatal hernia. bx=barretts esophagus  . ESOPHAGOGASTRODUODENOSCOPY N/A 04/13/2018   Procedure: ESOPHAGOGASTRODUODENOSCOPY (EGD);  Surgeon: Daneil Dolin, MD;  [facility];  Service: Endoscopy;  Laterality: N/A;  2:15pm  . ESOPHAGOGASTRODUODENOSCOPY N/A 06/27/2019   Procedure: ESOPHAGOGASTRODUODENOSCOPY (EGD);  Surgeon: Daneil Dolin, MD;  [facility];  Service: Endoscopy;  Laterality: N/A;  11:15am  . HEMORRHOID SURGERY N/A 07/12/2015   Procedure: SIMPLE HEMORRHOIDECTOMY;  Surgeon: Aviva Signs, MD;  Location: [facility];  Service: General;  Laterality: N/A;  . [place]  01/12/2011   Procedure: [name] DILATION;  Surgeon: Daneil Dolin, MD;  [facility];  Service: Endoscopy;  Laterality: N/A;  56 french  . MALONEY DILATION N/A 04/13/2018   Procedure: [name] DILATION;  Surgeon: Daneil Dolin, MD;  [facility];  Service: Endoscopy;  Laterality: N/A;  . MALONEY DILATION N/A 06/27/2019   Procedure: [name] DILATION;  Surgeon:  Rourk, [name], MD;  Location: [facility];  Service: Endoscopy;  Laterality: N/A;  . SQUAMOUS CELL CARCINOMA EXCISION     "cut off my hands; left arm; under right lip; right ear" (01/05/2014)  . THYROIDECTOMY, PARTIAL Right 1969   lobe-nodule present  . TOTAL KNEE ARTHROPLASTY Left 01/05/2014   Procedure: LEFT TOTAL KNEE ARTHROPLASTY;  Surgeon: Mcarthur Rossetti, MD;  Location: [facility];  Service: Orthopedics;  Laterality: Left;  . TOTAL KNEE ARTHROPLASTY Right 05/04/2014   Procedure: RIGHT TOTAL KNEE ARTHROPLASTY;  Surgeon: Mcarthur Rossetti, MD;  Location: [facility];  Service: Orthopedics;  Laterality: Right;  . UMBILICAL HERNIA REPAIR  2000   Social History   Socioeconomic History  . Marital status: Married    Spouse name: Not on file  . Number of children: 2  . Years of education: Not on file  . Highest education level: Not on file  Occupational History  . Occupation: [employer]    Comment: Current employment  . Occupation: Engineer, civil (consulting)    Comment: Prior employment  Tobacco Use  . Smoking status: Former Smoker    Packs/day: 1.00    Years: 20.00    Pack years: 20.00    Types: Cigarettes    Start date: 07/06/1956    Quit date: 07/06/1980    Years since quitting: 39.8  . Smokeless tobacco: Never Used  . Tobacco comment: "quit smoking in ~ 1982"  Vaping Use  . Vaping Use: Never used  Substance and Sexual Activity  . Alcohol use: Yes    Alcohol/week: 1.0 standard drink    Types: 1 Cans of beer per week    Comment: rare  . Drug use: No  . Sexual activity: Not on file  Other Topics Concern  . Not on file  Social History Narrative  . Not on file   Social Determinants of Health   Financial Resource Strain:   . Difficulty of Paying Living Expenses: Not on file  Food Insecurity:   . Worried About Charity fundraiser in the Last Year: Not on file  . Ran Out of Food in the Last Year: Not on file  Transportation Needs:   . Lack of Transportation (Medical): Not on file  . Lack of Transportation (Non-Medical): Not on file  Physical Activity:   . Days of Exercise per Week: Not on file  . Minutes of Exercise per Session: Not on file  Stress:   . Feeling of Stress : Not on file  Social Connections:   . Frequency of Communication with Friends and Family: Not on file  . Frequency of Social Gatherings with Friends and Family: Not on file  .  Attends Religious Services: Not on file  . Active Member of Clubs or Organizations: Not on file  . Attends Archivist Meetings: Not on file  . Marital Status: Not on file   Outpatient Encounter Medications as of 05/07/2020  Medication Sig  . allopurinol (ZYLOPRIM) 100 MG tablet Take 100 mg by mouth daily.  Marland Kitchen amLODipine (NORVASC) 10 MG tablet Take 10 mg by mouth every morning.   Marland Kitchen atorvastatin (LIPITOR) 40 MG tablet Take 40 mg by mouth at bedtime.  . chlorthalidone (HYGROTON) 25 MG tablet Take 25 mg by mouth daily.  Marland Kitchen desonide (DESOWEN) 0.05 % ointment Apply 1 application topically daily as needed for rash.  . diphenoxylate-atropine (LOMOTIL) 2.5-0.025 MG tablet SMARTSIG:1 By Mouth 4-5 Times Daily  . fluorouracil (EFUDEX) 5 % cream Apply 1  application topically 2 (two) times daily.  . hydrALAZINE (APRESOLINE) 25 MG tablet Take 1 tablet (25 mg total) 2 (two) times daily by mouth.  . levothyroxine (SYNTHROID) 175 MCG tablet Take 1 tablet (175 mcg total) by mouth daily before breakfast.  . lisinopril (PRINIVIL,ZESTRIL) 40 MG tablet Take 40 mg by mouth daily.   Marland Kitchen METROCREAM 0.75 % cream Apply 1 application topically at bedtime. Apply to nose  . omeprazole (PRILOSEC) 40 MG capsule TAKE 1 CAPSULE BY MOUTH EVERY DAY  . tamsulosin (FLOMAX) 0.4 MG CAPS capsule Take 0.4 mg by mouth at bedtime.   . TOLAK 4 % CREA Apply 1 application topically at bedtime. Apply to forearms down to back of hands  . Vitamin D, Ergocalciferol, (DRISDOL) 1.25 MG (50000 UNIT) CAPS capsule Take 1 capsule (50,000 Units total) by mouth every 7 (seven) days.  . [DISCONTINUED] levothyroxine (SYNTHROID) 150 MCG tablet Take 1 tablet (150 mcg total) by mouth daily before breakfast.   No facility-administered encounter medications on file as of 05/07/2020.   ALLERGIES: No Known Allergies  VACCINATION STATUS:  There is no immunization history on file for this patient.  Thyroid Problem Presents for follow-up (He is  being seen in follow-up for postsurgical hypothyroidism.  He has family history of thyroid dysfunction.  He denies family history of thyroid cancer. ) visit. Symptoms include diarrhea. Patient reports no anxiety, cold intolerance, constipation, depressed mood, fatigue, heat intolerance, leg swelling, palpitations, tremors, weight gain or weight loss. The symptoms have been stable.   Review of systems  Constitutional: + Minimally fluctuating body weight,  current Body mass index is 37.59 kg/m. , no fatigue, no subjective hyperthermia, no subjective hypothermia Eyes: no blurry vision, no xerophthalmia ENT: no sore throat, no nodules palpated in throat, no dysphagia/odynophagia, no hoarseness Cardiovascular: no chest pain, no shortness of breath, no palpitations, no leg swelling Respiratory: no cough, no shortness of breath Gastrointestinal: no nausea/vomiting, +diarrhea (seeing primary MD for this later today) Musculoskeletal: no muscle/joint aches Skin: no rashes, no hyperemia Neurological: no tremors, no numbness, no tingling, no dizziness Psychiatric: no depression, no anxiety   Objective:    BP 132/74 (BP Location: Left Arm, Patient Position: Sitting)   Pulse 98   Ht 5\' 10"  (1.778 m)   Wt 262 lb (118.8 kg)   BMI 37.59 kg/m   Wt Readings from Last 3 Encounters:  05/07/20 262 lb (118.8 kg)  11/02/19 268 lb (121.6 kg)  06/27/19 259 lb (117.5 kg)    BP Readings from Last 3 Encounters:  05/07/20 132/74  11/02/19 (!) 148/80  06/27/19 (!) 97/54     Physical Exam- Limited  Constitutional:  Body mass index is 37.59 kg/m. , not in acute distress, normal state of mind Eyes:  EOMI, no exophthalmos Neck: Supple Thyroid: No gross goiter Cardiovascular: RRR, no murmers, rubs, or gallops, no edema Respiratory: Adequate breathing efforts, no crackles, rales, rhonchi, or wheezing Musculoskeletal: no gross deformities, strength intact in all four extremities, no gross restriction of  joint movements Skin:  no rashes, no hyperemia Neurological: no tremor with outstretched hands   CMP ( most recent) CMP     Component Value Date/Time   NA 141 07/10/2015 1100   K 4.1 07/10/2015 1100   CL 105 07/10/2015 1100   CO2 28 07/10/2015 1100   GLUCOSE 100 (H) 07/10/2015 1100   BUN 17 07/10/2015 1100   CREATININE 1.21 07/10/2015 1100   CREATININE 1.14 08/31/2012 1149   CALCIUM 9.8 07/10/2015 1100  PROT 6.8 08/31/2012 1149   ALBUMIN 4.5 08/31/2012 1149   AST 19 08/31/2012 1149   ALT 22 08/31/2012 1149   ALKPHOS 55 08/31/2012 1149   BILITOT 0.6 08/31/2012 1149   GFRNONAA 57 (L) 07/10/2015 1100   GFRAA >60 07/10/2015 1100     Lipid Panel ( most recent) Lipid Panel     Component Value Date/Time   CHOL 130 04/07/2018 0000   TRIG 175 (A) 04/07/2018 0000   HDL 30 (A) 04/07/2018 0000   CHOLHDL 6.4 08/31/2012 1149   VLDL 52 (H) 08/31/2012 1149   LDLCALC 73 04/07/2018 0000   Recent Results (from the past 2160 hour(s))  TSH     Status: Abnormal   Collection Time: 04/29/20  8:36 AM  Result Value Ref Range   TSH 7.02 (H) 0.40 - 4.50 mIU/L  T4, free     Status: None   Collection Time: 04/29/20  8:36 AM  Result Value Ref Range   Free T4 1.3 0.8 - 1.8 ng/dL  VITAMIN D 25 Hydroxy (Vit-D Deficiency, Fractures)     Status: None   Collection Time: 04/29/20  8:36 AM  Result Value Ref Range   Vit D, 25-Hydroxy 34 30 - 100 ng/mL    Comment: Vitamin D Status         25-OH Vitamin D: . Deficiency:                    <20 ng/mL Insufficiency:             20 - 29 ng/mL Optimal:                 > or = 30 ng/mL . For 25-OH Vitamin D testing on patients on  D2-supplementation and patients for whom quantitation  of D2 and D3 fractions is required, the [facility](TM) 25-OH VIT D, (D2,D3), LC/MS/MS is recommended: order  code [phone] (patients >57yrs). See Note 1 . Note 1 . For additional information, please refer to  http://education.[facility].com/faq/FAQ199  (This  link is being provided for informational/ educational purposes only.)      Labs from April 07, 2018: Total T4 14.2 high, free T4 4.4 high, TSH 0.22 suppressed Labs from 09/28/2016 showed TSH 5.39 elevated, free T3 3 0.3, total T4 11.3. 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  Assessment & Plan:   1. Postsurgical hypothyroidism -His recent TFTs are consistent with slight under replacement.  He is advised to increase his dose of Levothyroxine to 175 mcg po daily before breakfast.    - We discussed about the correct intake of his thyroid hormone, on empty stomach at fasting, with water, separated by at least 30 minutes from breakfast and other medications,  and separated by more than 4 hours from calcium, iron, multivitamins, acid reflux medications (PPIs). -Patient is made aware of the fact that thyroid hormone replacement is needed for life, dose to be adjusted by periodic monitoring of thyroid function tests.  2. History of goiter status post partial thyroidectomy: - His repeat thyroid/neck ultrasound from 03/09/2017 is remarkable for surgical absent right lobe, 2.9 cm remnant left lobe with no discrete nodules or lesions. He will not need any intervention or dedicated follow up at this time.  3.  Vitamin D deficiency -His most recent Vitamin D level was 34 on 04/29/20.  He is currently taking OTC vitamin D3 2000 units daily.  He could benefit from increasing  to 5000 units daily as maintenance dose, especially during the winter months.  - I advised patient to maintain close follow up with Michell Heinrich, DO for primary care needs.      - Time spent on this patient care encounter:  20 minutes of which 50% was spent in  counseling and the rest reviewing  his current and  previous labs / studies and medications  doses and developing a plan for long term care. Cottie Banda Pomeroy  participated in the discussions, expressed  understanding, and voiced agreement with the above plans.  All questions were answered to his satisfaction. he is encouraged to contact clinic should he have any questions or concerns prior to his return visit.   Follow up plan: Return in about 4 months (around 09/04/2020) for Thyroid follow up, Previsit labs.  Rayetta Pigg, [facility] [address] [place], [place] [address] Phone: [phone] Fax: [phone]  05/07/2020, 11:46 AM

## 2020-05-07 NOTE — Patient Instructions (Signed)

## 2020-05-07 NOTE — Addendum Note (Signed)
Addended by: Gordy Levan, Kurstin Dimarzo A on: 05/07/2020 02:20 PM   Modules accepted: Orders

## 2020-05-09 LAB — CELIAC DISEASE PANEL
(tTG) Ab, IgA: <1 U/mL
(tTG) Ab, IgG: <1 U/mL
Gliadin IgA: <1 U/mL
Gliadin IgG: <1 U/mL
Immunoglobulin A: 122 mg/dL (ref 70–320)

## 2020-05-14 ENCOUNTER — Other Ambulatory Visit: Payer: Self-pay | Admitting: Gastroenterology

## 2020-05-14 LAB — GASTROINTESTINAL PATHOGEN PANEL PCR
C. difficile Tox A/B, PCR: DETECTED — AB
Campylobacter, PCR: NOT DETECTED
Cryptosporidium, PCR: NOT DETECTED
E coli (ETEC) LT/ST PCR: NOT DETECTED
E coli (STEC) stx1/stx2, PCR: NOT DETECTED
E coli 0157, PCR: NOT DETECTED
Giardia lamblia, PCR: NOT DETECTED
Norovirus, PCR: NOT DETECTED
Rotavirus A, PCR: NOT DETECTED
Salmonella, PCR: NOT DETECTED
Shigella, PCR: NOT DETECTED

## 2020-05-14 LAB — C. DIFFICILE GDH AND TOXIN A/B
GDH ANTIGEN: DETECTED
MICRO NUMBER:: 11157137
SPECIMEN QUALITY:: ADEQUATE
TOXIN A AND B: DETECTED

## 2020-05-14 LAB — PANCREATIC ELASTASE, FECAL: Pancreatic Elastase-1, Stool: 175 mcg/g — ABNORMAL LOW

## 2020-05-14 MED ORDER — VANCOMYCIN HCL 125 MG PO CAPS: 125.0000 mg | ORAL_CAPSULE | Freq: Four times a day (QID) | ORAL | 0 refills | Status: AC

## 2020-06-19 HISTORY — PX: WISDOM TOOTH EXTRACTION: SHX21

## 2020-06-20 ENCOUNTER — Encounter: Payer: Self-pay | Admitting: Nurse Practitioner

## 2020-06-20 ENCOUNTER — Ambulatory Visit (INDEPENDENT_AMBULATORY_CARE_PROVIDER_SITE_OTHER): Payer: Medicare Other | Admitting: Nurse Practitioner

## 2020-06-20 ENCOUNTER — Other Ambulatory Visit: Payer: Self-pay

## 2020-06-20 VITALS — BP 151/75 | HR 59 | Temp 97.4°F | Ht 70.0 in | Wt 260.0 lb

## 2020-06-20 DIAGNOSIS — K2100 Gastro-esophageal reflux disease with esophagitis, without bleeding: Secondary | ICD-10-CM | POA: Diagnosis not present

## 2020-06-20 DIAGNOSIS — A09 Infectious gastroenteritis and colitis, unspecified: Secondary | ICD-10-CM | POA: Diagnosis not present

## 2020-06-20 DIAGNOSIS — K2270 Barrett's esophagus without dysplasia: Secondary | ICD-10-CM | POA: Diagnosis not present

## 2020-06-20 DIAGNOSIS — A498 Other bacterial infections of unspecified site: Secondary | ICD-10-CM | POA: Diagnosis not present

## 2020-06-20 NOTE — Progress Notes (Signed)
CC'ED TO PCP 

## 2020-06-20 NOTE — Progress Notes (Signed)
Referring Provider: Michell Heinrich, DO Primary Care Physician:  Michell Heinrich, DO Primary GI:  Dr. Gala Romney  Chief Complaint  Patient presents with  . Follow-up    Diarrhea is better.     HPI:   [name] is a 79 y.o. male who presents for follow-up and diarrhea.  The patient was last seen in our office 05/07/2020 for the same.  Noted history of GERD, Barrett's esophagus, esophageal dysphagia.  Managed with omeprazole 40 mg daily.  EGD updated December 2020 with esophageal biopsies consistent with reflux and recommended continue PPI.  As last visit he noted diarrhea started about a month ago, gradual onset and progressive worsening with "floating stools" and typically 5 or more stools a day that are half-and-half loose and watery.  Notes gelatinous consistency.  No history of pancreatitis.  His appetite is good.  He was evaluated by urgent care in [place], [place] who gave him Lomotil which helped initially but less this time went on.  No obvious risks such as healthcare exposure, water, travel, sick contacts.  Potential drug effect with allopurinol and chlorthalidone.  No other overt GI complaints.  Recommended CBC, CMP records from primary care.  Also recommended stool studies including C. difficile and GI pathogen panel, pancreatic fecal elastase, celiac panel.  Consider probiotic based on results and Bentyl versus Imodium for symptomatic management if no infection.  Follow-up in 6 weeks.  Stool studies were completed 05/08/2020 and found to be positive for C. difficile.  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  He was treated with vancomycin 125 mg 4 times daily for 10 days.  Hygiene instructions based on CDC recommendations were sent to the patient as well.  Today states doing okay overall. His diarrhea has resolved with vancomycin treatment. Nobody in the house picked up the infection. Denies abdominal pain, N/V, hematochezia,  melena, fever, chills. Denies URI or flu-like symptoms. Denies loss of sense of taste or smell. The patient has received COVID-19 vaccination(s). Denies chest pain, dyspnea, dizziness, lightheadedness, syncope, near syncope. Denies any other upper or lower GI symptoms.   Past Medical History:  Diagnosis Date  . Arthritis    "fingers; knees; toes" (01/05/2014)  . Barrett's esophagus   . BPH (benign prostatic hyperplasia)   . Bradycardia 2009   Sinus rhythm in the 40s without symptoms;  Marland Kitchen GERD (gastroesophageal reflux disease)    ulcerative/erosive  . Gout   . Hemorrhoids   . Hiatal hernia   . History of skin cancer   . Hx of adenomatous colonic polyps   . Hyperlipidemia   . Hypertension   . Hypothyroidism    Following partial thyroidectomy  . Squamous carcinoma    skin cancer benign arms and right ear and rightside lip  . Tobacco abuse, in remission    10-20 pack years; discontinued in 1981    Past Surgical History:  Procedure Laterality Date  . ABDOMINAL HERNIA REPAIR  1990's  . BIOPSY  04/13/2018   Procedure: BIOPSY;  Surgeon: Daneil Dolin, MD;  [facility];  Service: Endoscopy;;  . BIOPSY  06/27/2019   Procedure: BIOPSY;  Surgeon: Daneil Dolin, MD;  [facility];  Service: Endoscopy;;  Distal Esophagus biopsy  . CATARACT EXTRACTION W/ INTRAOCULAR LENS  IMPLANT, BILATERAL Bilateral 2012-2014  . COLONOSCOPY  12/2005   normal  . COLONOSCOPY  2004   tubulovillous adenoma of rectum   . COLONOSCOPY  01/12/2011  Dr. Gala Romney- normal rectum  . COLONOSCOPY N/A 07/09/2015   Procedure: COLONOSCOPY;  Surgeon: Daneil Dolin, MD;  [facility];  Service: Endoscopy;  Laterality: N/A;  730   . COLONOSCOPY N/A 01/04/2019   Procedure: COLONOSCOPY;  Surgeon: Daneil Dolin, MD;  [facility];  Service: Endoscopy;  Laterality: N/A;  10:45am  . ELBOW ARTHROSCOPY Left 2014  . ESOPHAGEAL DILATION N/A 02/13/2015   Procedure: ESOPHAGEAL DILATION;   Surgeon: Daneil Dolin, MD;  [facility];  Service: Endoscopy;  Laterality: N/A;  . ESOPHAGOGASTRODUODENOSCOPY  01/2006   geographic erosive RE, noncritical peptic stricture s/p dilation, antral erosion  . ESOPHAGOGASTRODUODENOSCOPY  01/12/2011   Dr. Gala Romney- hiatal hernia, distal esophageal erosions, barretts esophagus  . ESOPHAGOGASTRODUODENOSCOPY  01/27/2012   Dr.Rourk- short segment barretts esophagus, erosive reflux esophagitis. bx= barretts esophagus, negative for dysplasia  . ESOPHAGOGASTRODUODENOSCOPY N/A 02/13/2015   Dr.Rourk- ulcerative/erosive reflux esophagitis, abnormal esophagitis c/w prior diagnosis of barretts esophagus, hiatal hernia. bx=barretts esophagus  . ESOPHAGOGASTRODUODENOSCOPY N/A 04/13/2018   Procedure: ESOPHAGOGASTRODUODENOSCOPY (EGD);  Surgeon: Daneil Dolin, MD;  [facility];  Service: Endoscopy;  Laterality: N/A;  2:15pm  . ESOPHAGOGASTRODUODENOSCOPY N/A 06/27/2019   Procedure: ESOPHAGOGASTRODUODENOSCOPY (EGD);  Surgeon: Daneil Dolin, MD;  [facility];  Service: Endoscopy;  Laterality: N/A;  11:15am  . HEMORRHOID SURGERY N/A 07/12/2015   Procedure: SIMPLE HEMORRHOIDECTOMY;  Surgeon: Aviva Signs, MD;  Location: [facility];  Service: General;  Laterality: N/A;  . [place]  01/12/2011   Procedure: [name] DILATION;  Surgeon: Daneil Dolin, MD;  [facility];  Service: Endoscopy;  Laterality: N/A;  56 french  . MALONEY DILATION N/A 04/13/2018   Procedure: [name] DILATION;  Surgeon: Daneil Dolin, MD;  [facility];  Service: Endoscopy;  Laterality: N/A;  [name] DILATION N/A 06/27/2019   Procedure: [name] DILATION;  Surgeon: Daneil Dolin, MD;  [facility];  Service: Endoscopy;  Laterality: N/A;  . SQUAMOUS CELL CARCINOMA EXCISION     "cut off my hands; left arm; under right lip; right ear" (01/05/2014)  . THYROIDECTOMY, PARTIAL Right 1969   lobe-nodule present  . TOTAL  KNEE ARTHROPLASTY Left 01/05/2014   Procedure: LEFT TOTAL KNEE ARTHROPLASTY;  Surgeon: Mcarthur Rossetti, MD;  Location: [facility];  Service: Orthopedics;  Laterality: Left;  . TOTAL KNEE ARTHROPLASTY Right 05/04/2014   Procedure: RIGHT TOTAL KNEE ARTHROPLASTY;  Surgeon: Mcarthur Rossetti, MD;  Location: [facility];  Service: Orthopedics;  Laterality: Right;  . UMBILICAL HERNIA REPAIR  2000  . WISDOM TOOTH EXTRACTION  06/19/2020    Current Outpatient Medications  Medication Sig Dispense Refill  . allopurinol (ZYLOPRIM) 100 MG tablet Take 100 mg by mouth daily.    Marland Kitchen amLODipine (NORVASC) 10 MG tablet Take 10 mg by mouth every morning.    Marland Kitchen atorvastatin (LIPITOR) 40 MG tablet Take 40 mg by mouth at bedtime.    . chlorthalidone (HYGROTON) 25 MG tablet Take 25 mg by mouth daily.    Marland Kitchen desonide (DESOWEN) 0.05 % ointment Apply 1 application topically daily as needed for rash.    . fluorouracil (EFUDEX) 5 % cream Apply 1 application topically 2 (two) times daily.    . hydrALAZINE (APRESOLINE) 25 MG tablet Take 1 tablet (25 mg total) 2 (two) times daily by mouth. 60 tablet 6  . levothyroxine (SYNTHROID) 175 MCG tablet Take 1 tablet (175 mcg total)  by mouth daily before breakfast. 90 tablet 3  . losartan (COZAAR) 50 MG tablet Take 50 mg by mouth daily.    Marland Kitchen METROCREAM 0.75 % cream Apply 1 application topically at bedtime. Apply to nose    . omeprazole (PRILOSEC) 40 MG capsule TAKE 1 CAPSULE BY MOUTH EVERY DAY 90 capsule 3  . tamsulosin (FLOMAX) 0.4 MG CAPS capsule Take 0.4 mg by mouth at bedtime.     . TOLAK 4 % CREA Apply 1 application topically at bedtime. Apply to forearms down to back of hands    . Vitamin D, Ergocalciferol, (DRISDOL) 1.25 MG (50000 UNIT) CAPS capsule Take 1 capsule (50,000 Units total) by mouth every 7 (seven) days. 12 capsule 0   No current facility-administered medications for this visit.    Allergies as of 06/20/2020 - Review Complete 06/20/2020  Allergen Reaction Noted  .  Meloxicam Swelling 06/20/2020    Family History  Problem Relation Age of Onset  . Lung cancer Father 53  . Colon polyps Brother   . Colon cancer Neg Hx   . Liver disease Neg Hx     Social History   Socioeconomic History  . Marital status: Married    Spouse name: Not on file  . Number of children: 2  . Years of education: Not on file  . Highest education level: Not on file  Occupational History  . Occupation: [employer]    Comment: Current employment  . Occupation: Engineer, civil (consulting)    Comment: Prior employment  Tobacco Use  . Smoking status: Former Smoker    Packs/day: 1.00    Years: 20.00    Pack years: 20.00    Types: Cigarettes    Start date: 07/06/1956    Quit date: 07/06/1980    Years since quitting: 39.9  . Smokeless tobacco: Never Used  . Tobacco comment: "quit smoking in ~ 1982"  Vaping Use  . Vaping Use: Never used  Substance and Sexual Activity  . Alcohol use: Yes    Comment: occ beer  . Drug use: No  . Sexual activity: Not on file  Other Topics Concern  . Not on file  Social History Narrative  . Not on file   Social Determinants of Health   Financial Resource Strain: Not on file  Food Insecurity: Not on file  Transportation Needs: Not on file  Physical Activity: Not on file  Stress: Not on file  Social Connections: Not on file    Subjective: Review of Systems  Constitutional: Negative for chills, fever, malaise/fatigue and weight loss.  HENT: Negative for congestion and sore throat.   Respiratory: Negative for cough and shortness of breath.   Cardiovascular: Negative for chest pain and palpitations.  Gastrointestinal: Negative for abdominal pain, blood in stool, diarrhea, heartburn, melena, nausea and vomiting.  Musculoskeletal: Negative for joint pain and myalgias.  Skin: Negative for rash.  Neurological: Negative for dizziness and weakness.  Endo/Heme/Allergies: Does not bruise/bleed easily.  Psychiatric/Behavioral: Negative for  depression. The patient is not nervous/anxious.   All other systems reviewed and are negative.    Objective: BP (!) 151/75   Pulse (!) 59   Temp (!) 97.4 F (36.3 C) (Temporal)   Ht 5\' 10"  (1.778 m)   Wt 260 lb (117.9 kg)   BMI 37.31 kg/m  Physical Exam Vitals and nursing note reviewed.  Constitutional:      General: He is not in acute distress.    Appearance: Normal appearance. He is obese. He is  not ill-appearing, toxic-appearing or diaphoretic.  HENT:     Head: Normocephalic and atraumatic.     Nose: No congestion or rhinorrhea.  Eyes:     General: No scleral icterus. Cardiovascular:     Rate and Rhythm: Normal rate and regular rhythm.     Heart sounds: Normal heart sounds.  Pulmonary:     Effort: Pulmonary effort is normal.     Breath sounds: Normal breath sounds.  Abdominal:     General: Bowel sounds are normal. There is no distension.     Palpations: Abdomen is soft. There is no hepatomegaly, splenomegaly or mass.     Tenderness: There is no abdominal tenderness. There is no guarding or rebound.     Hernia: No hernia is present.  Musculoskeletal:     Cervical back: Neck supple.  Skin:    General: Skin is warm and dry.     Coloration: Skin is not jaundiced.     Findings: No bruising or rash.  Neurological:     General: No focal deficit present.     Mental Status: He is alert and oriented to person, place, and time. Mental status is at baseline.  Psychiatric:        Mood and Affect: Mood normal.        Behavior: Behavior normal.        Thought Content: Thought content normal.      Assessment:  Very pleasant 79 year old male with a clinical history of GERD and Barrett's esophagus currently managed on omeprazole 40 mg daily.  He presented at his last visit with gradual but persistent onset of diarrhea.  He did test positive for C. difficile and was treated with vancomycin.  No red flag/warning signs or symptoms.  Clinically doing well.  EGD and colonoscopy both  up-to-date.  C. difficile diarrhea: Completed entire course of vancomycin 125 mg 4 times a day.  His diarrhea has since resolved.  He agrees that he was very diligent on cleaning and nobody in his house was subsequently infected.  He thinks he picked up his infection from a breakfast diner.  Stools now back to normal.  Recommend he monitor and let us know of any recurrent diarrhea.  GERD with history of Barrett's esophagus: Remains on PPI daily day take every evening.  We again discussed the need to take PPI indefinitely and is very well aware.  No reflux on PPI.  EGD up-to-date and will not be due for another 1 to 2 years.  Recommend he call us for any worsening symptoms.   Plan: 1. Continue current medications especially PPI indefinitely 2. Call for any recurrent diarrhea or worsening GERD symptoms 3. Follow-up in 1 year    Thank you for allowing [place] to participate in the care of [place], DNP, AGNP-C Adult & Gerontological Nurse Practitioner [facility]   06/20/2020 9:01 AM   Disclaimer: This note was dictated with voice recognition software. Similar sounding words can inadvertently be transcribed and may not be corrected upon review.

## 2020-06-20 NOTE — Patient Instructions (Signed)
Your health issues we discussed today were:   Diarrhea due to C. difficile infection: 1. I am glad the vancomycin antibiotic worked well for you! 2. Let us know if you have any recurrent diarrhea  GERD (reflux/heartburn) with a history of Barrett's esophagus: 1. As we discussed you will need to be on your acid blocker (omeprazole/Prilosec) indefinitely/for the rest of your life 2. Do not let other care providers take you off your medication without checking with GI first 3. Call us if you have any recurrent or severe reflux symptoms  Overall I recommend:  1. Continue other current medications 2. Return for follow-up 1 year 3. Call us for any questions or concerns   ---------------------------------------------------------------  I am glad you have gotten your COVID-19 vaccination!  Even though you are fully vaccinated you should continue to follow CDC and state/local guidelines.  ---------------------------------------------------------------   At [facility] we value your feedback. You may receive a survey about your visit today. Please share your experience as we strive to create trusting relationships with our patients to provide genuine, compassionate, quality care.  We appreciate your understanding and patience as we review any laboratory studies, imaging, and other diagnostic tests that are ordered as we care for you. Our office policy is 5 business days for review of these results, and any emergent or urgent results are addressed in a timely manner for your best interest. If you do not hear from our office in 1 week, please contact us.   We also encourage the use of MyChart, which contains your medical information for your review as well. If you are not enrolled in this feature, an access code is on this after visit summary for your convenience. Thank you for allowing [place] to be involved in your care.  It was great to see you today!  I hope you have a Merry  Christmas and Happy Holidays!!

## 2020-08-28 LAB — TSH: TSH: 0.387 u[IU]/mL — ABNORMAL LOW (ref 0.450–4.500)

## 2020-08-28 LAB — T4, FREE: Free T4: 1.88 ng/dL — ABNORMAL HIGH (ref 0.82–1.77)

## 2020-09-05 ENCOUNTER — Ambulatory Visit (INDEPENDENT_AMBULATORY_CARE_PROVIDER_SITE_OTHER): Payer: Medicare Other | Admitting: Nurse Practitioner

## 2020-09-05 ENCOUNTER — Other Ambulatory Visit: Payer: Self-pay

## 2020-09-05 ENCOUNTER — Encounter: Payer: Self-pay | Admitting: Nurse Practitioner

## 2020-09-05 VITALS — BP 152/78 | HR 73 | Ht 70.0 in | Wt 271.0 lb

## 2020-09-05 DIAGNOSIS — E559 Vitamin D deficiency, unspecified: Secondary | ICD-10-CM

## 2020-09-05 DIAGNOSIS — E782 Mixed hyperlipidemia: Secondary | ICD-10-CM

## 2020-09-05 DIAGNOSIS — E890 Postprocedural hypothyroidism: Secondary | ICD-10-CM

## 2020-09-05 MED ORDER — LEVOTHYROXINE SODIUM 150 MCG PO TABS: 150.0000 ug | ORAL_TABLET | Freq: Every day | ORAL | 3 refills | Status: DC

## 2020-09-05 NOTE — Patient Instructions (Signed)

## 2020-09-05 NOTE — Progress Notes (Signed)
05/02/19      Endocrinology follow-up note   Subjective:    Patient ID: [name], male    DOB: 10/06/1940, PCP Michell Heinrich, DO   Past Medical History:  Diagnosis Date  . Arthritis    "fingers; knees; toes" (01/05/2014)  . Barrett's esophagus   . BPH (benign prostatic hyperplasia)   . Bradycardia 2009   Sinus rhythm in the 40s without symptoms;  Marland Kitchen GERD (gastroesophageal reflux disease)    ulcerative/erosive  . Gout   . Hemorrhoids   . Hiatal hernia   . History of skin cancer   . Hx of adenomatous colonic polyps   . Hyperlipidemia   . Hypertension   . Hypothyroidism    Following partial thyroidectomy  . Squamous carcinoma    skin cancer benign arms and right ear and rightside lip  . Tobacco abuse, in remission    10-20 pack years; discontinued in 1981   Past Surgical History:  Procedure Laterality Date  . ABDOMINAL HERNIA REPAIR  1990's  . BIOPSY  04/13/2018   Procedure: BIOPSY;  Surgeon: Daneil Dolin, MD;  Location: [facility];  Service: Endoscopy;;  . BIOPSY  06/27/2019   Procedure: BIOPSY;  Surgeon: Daneil Dolin, MD;  [facility]: [facility];  Service: Endoscopy;;  Distal Esophagus biopsy  . CATARACT EXTRACTION W/ INTRAOCULAR LENS  IMPLANT, BILATERAL Bilateral 2012-2014  . COLONOSCOPY  12/2005   normal  . COLONOSCOPY  2004   tubulovillous adenoma of rectum   . COLONOSCOPY  01/12/2011   Dr. Gala Romney- normal rectum  . COLONOSCOPY N/A 07/09/2015   Procedure: COLONOSCOPY;  Surgeon: Daneil Dolin, MD;  [facility]: [facility];  Service: Endoscopy;  Laterality: N/A;  730   . COLONOSCOPY N/A 01/04/2019   Procedure: COLONOSCOPY;  Surgeon: Daneil Dolin, MD;  [facility]: [facility];  Service: Endoscopy;  Laterality: N/A;  10:45am  . ELBOW ARTHROSCOPY Left 2014  . ESOPHAGEAL DILATION N/A 02/13/2015   Procedure: ESOPHAGEAL DILATION;  Surgeon: Daneil Dolin, MD;  [facility];  Service: Endoscopy;  Laterality: N/A;  . ESOPHAGOGASTRODUODENOSCOPY   01/2006   geographic erosive RE, noncritical peptic stricture s/p dilation, antral erosion  . ESOPHAGOGASTRODUODENOSCOPY  01/12/2011   Dr. Gala Romney- hiatal hernia, distal esophageal erosions, barretts esophagus  . ESOPHAGOGASTRODUODENOSCOPY  01/27/2012   Dr.Rourk- short segment barretts esophagus, erosive reflux esophagitis. bx= barretts esophagus, negative for dysplasia  . ESOPHAGOGASTRODUODENOSCOPY N/A 02/13/2015   Dr.Rourk- ulcerative/erosive reflux esophagitis, abnormal esophagitis c/w prior diagnosis of barretts esophagus, hiatal hernia. bx=barretts esophagus  . ESOPHAGOGASTRODUODENOSCOPY N/A 04/13/2018   Procedure: ESOPHAGOGASTRODUODENOSCOPY (EGD);  Surgeon: Daneil Dolin, MD;  [facility];  Service: Endoscopy;  Laterality: N/A;  2:15pm  . ESOPHAGOGASTRODUODENOSCOPY N/A 06/27/2019   Procedure: ESOPHAGOGASTRODUODENOSCOPY (EGD);  Surgeon: Daneil Dolin, MD;  [facility];  Service: Endoscopy;  Laterality: N/A;  11:15am  . HEMORRHOID SURGERY N/A 07/12/2015   Procedure: SIMPLE HEMORRHOIDECTOMY;  Surgeon: Aviva Signs, MD;  Location: [facility];  Service: General;  Laterality: N/A;  . [place]  01/12/2011   Procedure: [name] DILATION;  Surgeon: Daneil Dolin, MD;  [facility]: [facility];  Service: Endoscopy;  Laterality: N/A;  56 french  . MALONEY DILATION N/A 04/13/2018   Procedure: [name] DILATION;  Surgeon: Daneil Dolin, MD;  [facility];  Service: Endoscopy;  Laterality: N/A;  . MALONEY DILATION N/A 06/27/2019   Procedure: [name] DILATION;  Surgeon:  Rourk, [name], MD;  Location: [facility];  Service: Endoscopy;  Laterality: N/A;  . SQUAMOUS CELL CARCINOMA EXCISION     "cut off my hands; left arm; under right lip; right ear" (01/05/2014)  . THYROIDECTOMY, PARTIAL Right 1969   lobe-nodule present  . TOTAL KNEE ARTHROPLASTY Left 01/05/2014   Procedure: LEFT TOTAL KNEE ARTHROPLASTY;  Surgeon: Mcarthur Rossetti, MD;  Location: [facility];  Service: Orthopedics;  Laterality: Left;  . TOTAL KNEE ARTHROPLASTY Right 05/04/2014   Procedure: RIGHT TOTAL KNEE ARTHROPLASTY;  Surgeon: Mcarthur Rossetti, MD;  Location: [facility];  Service: Orthopedics;  Laterality: Right;  . UMBILICAL HERNIA REPAIR  2000  . WISDOM TOOTH EXTRACTION  06/19/2020   Social History   Socioeconomic History  . Marital status: Married    Spouse name: Not on file  . Number of children: 2  . Years of education: Not on file  . Highest education level: Not on file  Occupational History  . Occupation: [employer]    Comment: Current employment  . Occupation: Engineer, civil (consulting)    Comment: Prior employment  Tobacco Use  . Smoking status: Former Smoker    Packs/day: 1.00    Years: 20.00    Pack years: 20.00    Types: Cigarettes    Start date: 07/06/1956    Quit date: 07/06/1980    Years since quitting: 40.1  . Smokeless tobacco: Never Used  . Tobacco comment: "quit smoking in ~ 1982"  Vaping Use  . Vaping Use: Never used  Substance and Sexual Activity  . Alcohol use: Yes    Comment: occ beer  . Drug use: No  . Sexual activity: Not on file  Other Topics Concern  . Not on file  Social History Narrative  . Not on file   Social Determinants of Health   Financial Resource Strain: Not on file  Food Insecurity: Not on file  Transportation Needs: Not on file  Physical Activity: Not on file  Stress: Not on file  Social Connections: Not on file   Outpatient Encounter Medications as of 09/05/2020  Medication Sig  . allopurinol (ZYLOPRIM) 100 MG tablet Take 100 mg by mouth daily.  Marland Kitchen amLODipine (NORVASC) 10 MG tablet Take 10 mg by mouth every morning.  Marland Kitchen atorvastatin (LIPITOR) 40 MG tablet Take 40 mg by mouth at bedtime.  . chlorthalidone (HYGROTON) 25 MG tablet Take 25 mg by mouth daily.  Marland Kitchen desonide (DESOWEN) 0.05 % ointment Apply 1 application topically daily as needed for rash.  . fluorouracil (EFUDEX) 5 % cream Apply 1 application  topically 2 (two) times daily.  . hydrALAZINE (APRESOLINE) 25 MG tablet Take 1 tablet (25 mg total) 2 (two) times daily by mouth.  . levothyroxine (SYNTHROID) 150 MCG tablet Take 1 tablet (150 mcg total) by mouth daily before breakfast.  . losartan (COZAAR) 50 MG tablet Take 50 mg by mouth daily.  Marland Kitchen METROCREAM 0.75 % cream Apply 1 application topically at bedtime. Apply to nose  . omeprazole (PRILOSEC) 40 MG capsule TAKE 1 CAPSULE BY MOUTH EVERY DAY  . tamsulosin (FLOMAX) 0.4 MG CAPS capsule Take 0.4 mg by mouth at bedtime.   . TOLAK 4 % CREA Apply 1 application topically at bedtime. Apply to forearms down to back of hands  . Vitamin D, Ergocalciferol, (DRISDOL) 1.25 MG (50000 UNIT) CAPS capsule Take 1 capsule (50,000 Units total) by mouth every 7 (seven) days.  . [DISCONTINUED] levothyroxine (SYNTHROID) 175 MCG tablet Take 1 tablet (175  mcg total) by mouth daily before breakfast.   No facility-administered encounter medications on file as of 09/05/2020.   ALLERGIES: Allergies  Allergen Reactions  . Meloxicam Swelling    Face and tongue swelling      VACCINATION STATUS: Immunization History  Administered Date(s) Administered  . 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  He has family history of thyroid dysfunction.  He denies family history of thyroid cancer. ) visit. Symptoms include weight gain. Patient reports no anxiety, cold intolerance, constipation, depressed mood, diarrhea, fatigue, heat intolerance, leg swelling, palpitations, tremors or weight loss. The symptoms have been stable.  Hypertension This is a chronic problem. The current episode started more than 1 year ago. The problem is unchanged. The problem is controlled. Pertinent negatives include no palpitations. Agents associated with hypertension include thyroid hormones. Risk factors for  coronary artery disease include dyslipidemia, obesity, male gender, sedentary lifestyle and smoking/tobacco exposure. Past treatments include angiotensin blockers, calcium channel blockers, diuretics and direct vasodilators. The current treatment provides moderate improvement. There are no compliance problems.  Identifiable causes of hypertension include a thyroid problem.   Review of systems  Constitutional: + Minimally fluctuating body weight,  current Body mass index is 38.88 kg/m. , no fatigue, no subjective hyperthermia, no subjective hypothermia Eyes: no blurry vision, no xerophthalmia ENT: no sore throat, no nodules palpated in throat, no dysphagia/odynophagia, no hoarseness Cardiovascular: no chest pain, no shortness of breath, no palpitations, + leg swelling (ankles) Respiratory: no cough, no shortness of breath Gastrointestinal: no nausea/vomiting/diarrhea Musculoskeletal: no muscle/joint aches Skin: no rashes, no hyperemia Neurological: no tremors, no numbness, no tingling, no dizziness Psychiatric: no depression, no anxiety   Objective:    BP (!) 152/78   Pulse 73   Ht 5\' 10"  (1.778 m)   Wt 271 lb (122.9 kg)   BMI 38.88 kg/m   Wt Readings from Last 3 Encounters:  09/05/20 271 lb (122.9 kg)  06/20/20 260 lb (117.9 kg)  05/07/20 261 lb 9.6 oz (118.7 kg)    BP Readings from Last 3 Encounters:  09/05/20 (!) 152/78  06/20/20 (!) 151/75  05/07/20 121/70    Physical Exam- Limited  Constitutional:  Body mass index is 38.88 kg/m. , not in acute distress, normal state of mind Eyes:  EOMI, no exophthalmos Neck: Supple Cardiovascular: RRR, no murmers, rubs, or gallops, + nonpitting edema to bilateral ankles Respiratory: Adequate breathing efforts, no crackles, rales, rhonchi, or wheezing Musculoskeletal: no gross deformities, strength intact in all four extremities, no gross restriction of joint movements Skin:  no rashes, no hyperemia Neurological: no tremor with  outstretched hands   CMP ( most recent) CMP     Component Value Date/Time   NA 141 07/10/2015 1100   K 4.1 07/10/2015 1100   CL 105 07/10/2015 1100   CO2 28 07/10/2015 1100   GLUCOSE 100 (H) 07/10/2015 1100   BUN 17 07/10/2015 1100   CREATININE 1.21 07/10/2015 1100   CREATININE 1.14 08/31/2012 1149   CALCIUM 9.8 07/10/2015 1100   PROT 6.8 08/31/2012 1149   ALBUMIN 4.5 08/31/2012 1149   AST 19 08/31/2012 1149   ALT 22 08/31/2012 1149   ALKPHOS 55 08/31/2012 1149   BILITOT 0.6 08/31/2012 1149   GFRNONAA 57 (L) 07/10/2015 1100   GFRAA >60 07/10/2015 1100     Lipid Panel ( most recent) Lipid Panel     Component Value Date/Time  CHOL 130 04/07/2018 0000   TRIG 175 (A) 04/07/2018 0000   HDL 30 (A) 04/07/2018 0000   CHOLHDL 6.4 08/31/2012 1149   VLDL 52 (H) 08/31/2012 1149   LDLCALC 73 04/07/2018 0000   Recent Results (from the past 2160 hour(s))  TSH     Status: Abnormal   Collection Time: 08/27/20 10:48 AM  Result Value Ref Range   TSH 0.387 (L) 0.450 - 4.500 uIU/mL  T4, free     Status: Abnormal   Collection Time: 08/27/20 10:48 AM  Result Value Ref Range   Free T4 1.88 (H) 0.82 - 1.77 ng/dL     Labs from April 07, 2018: Total T4 14.2 high, free T4 4.4 high, TSH 0.22 suppressed Labs from 09/28/2016 showed TSH 5.39 elevated, free T3 3 0.3, total T4 11.3. 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  Assessment & Plan:   1. Postsurgical hypothyroidism -His previsit thyroid function tests are consistent with slight over-replacement.  He is advised to decrease his dose back to 150 mcg po daily before breakfast.    - We discussed about the correct intake of his thyroid hormone, on empty stomach at fasting, with water, separated by at least 30 minutes from breakfast and other medications,  and separated by more than 4 hours from calcium, iron, multivitamins, acid reflux medications  (PPIs). -Patient is made aware of the fact that thyroid hormone replacement is needed for life, dose to be adjusted by periodic monitoring of thyroid function tests.  2. History of goiter status post partial thyroidectomy: - His repeat thyroid/neck ultrasound from 03/09/2017 is remarkable for surgical absent right lobe, 2.9 cm remnant left lobe with no discrete nodules or lesions. He will not need any intervention or dedicated follow up at this time.  3.  Vitamin D deficiency -His most recent Vitamin D level was 34 on 04/29/20.  He is currently taking OTC vitamin D3 2000 units daily.  He could benefit from increasing to 5000 units daily as maintenance dose, especially during the winter months.  4. Hypertension His blood pressure is controlled to target.  He would like for me to start managing his hypertension.  He is advised to continue Norvasc 10 mg po daily, Chlorthalidone 25 mg o daily, Hydralazine 25 mg po twice daily, and Losartan 50 mg po daily.  His PCP recently added HCTZ 50 mg to his regimen but he was hesitant to take it because they did not discuss it.  I imagine it was added to help with the swelling in his ankles.  Based on his BP readings, I advised him not to take it in addition to his other medications for safety concerns that it may drop him too low and increase his risk of falls.  Will check CMP and Lipid panel prior to next visit in addition to his thyroid labs.  - I advised patient to maintain close follow up with Michell Heinrich, DO for primary care needs.    - Time spent on this patient care encounter:  40 min, of which > 50% was spent in  counseling and the rest reviewing his blood glucose logs , discussing his hypoglycemia and hyperglycemia episodes, reviewing his current and  previous labs / studies  ( including abstraction from other facilities) and medications  doses and developing a  long term treatment plan and documenting his care.   Please refer to Patient Instructions  for Blood Glucose Monitoring  and Insulin/Medications Dosing Guide"  in media tab for additional information. Please  also refer to " Patient Self Inventory" in the Media  tab for reviewed elements of pertinent patient history.  Cottie Banda Nuzzo participated in the discussions, expressed understanding, and voiced agreement with the above plans.  All questions were answered to his satisfaction. he is encouraged to contact clinic should he have any questions or concerns prior to his return visit.  Follow up plan: Return in about 3 months (around 12/06/2020) for Thyroid follow up, Previsit labs.  Rayetta Pigg, [facility] [address] [place],  [address] Phone: [phone] Fax: [phone]  09/05/2020, 8:51 AM

## 2020-09-12 ENCOUNTER — Ambulatory Visit (INDEPENDENT_AMBULATORY_CARE_PROVIDER_SITE_OTHER): Payer: Medicare Other | Admitting: General Surgery

## 2020-09-12 ENCOUNTER — Encounter: Payer: Self-pay | Admitting: General Surgery

## 2020-09-12 ENCOUNTER — Other Ambulatory Visit: Payer: Self-pay

## 2020-09-12 VITALS — BP 179/88 | HR 80 | Temp 97.5°F | Resp 14 | Ht 70.0 in | Wt 271.0 lb

## 2020-09-12 DIAGNOSIS — L723 Sebaceous cyst: Secondary | ICD-10-CM

## 2020-09-12 NOTE — Patient Instructions (Signed)
Epidermoid Cyst  An epidermoid cyst, also called an epidermal cyst, is a small lump under your skin. The cyst contains a substance called keratin. Do not try to pop or open the cyst yourself. What are the causes?  A blocked hair follicle.  A hair that curls and re-enters the skin instead of growing straight out of the skin.  A blocked pore.  Irritated skin.  An injury to the skin.  Certain conditions that are passed along from parent to child.  Human papillomavirus (HPV). This happens rarely when cysts occur on the bottom of the feet.  Long-term sun damage to the skin. What increases the risk?  Having acne.  Being male.  Having an injury to the skin.  Being past puberty.  Having certain conditions caused by genes (genetic disorder) What are the signs or symptoms? These cysts are usually harmless, but they can get infected. Symptoms of infection may include:  Redness.  Inflammation.  Tenderness.  Warmth.  Fever.  A bad-smelling substance that drains from the cyst.  Pus that drains from the cyst. How is this treated? In many cases, epidermoid cysts go away on their own without treatment. If a cyst becomes infected, treatment may include:  Opening and draining the cyst, done by a doctor. After draining, you may need minor surgery to remove the rest of the cyst.  Antibiotic medicine.  Shots of medicines (steroids) that help to reduce inflammation.  Surgery to remove the cyst. Surgery may be done if the cyst: ? Becomes large. ? Bothers you. ? Has a chance of turning into cancer.  Do not try to open a cyst yourself. Follow these instructions at home: Medicines  Take over-the-counter and prescription medicines as told by your doctor.  If you were prescribed an antibiotic medicine, take it as told by your doctor. Do not stop taking it even if you start to feel better. General instructions  Keep the area around your cyst clean and dry.  Wear loose, dry  clothing.  Avoid touching your cyst.  Check your cyst every day for signs of infection. Check for: ? Redness, swelling, or pain. ? Fluid or blood. ? Warmth. ? Pus or a bad smell.  Keep all follow-up visits. How is this prevented?  Wear clean, dry, clothing.  Avoid wearing tight clothing.  Keep your skin clean and dry. Take showers or baths every day. Contact a doctor if:  Your cyst has symptoms of infection.  Your condition does not improve or gets worse.  You have a cyst that looks different from other cysts you have had.  You have a fever. Get help right away if:  Redness spreads from the cyst into the area close by. Summary  An epidermoid cyst is a small lump under your skin.  If a cyst becomes infected, treatment may include surgery to open and drain the cyst, or to remove it.  Take over-the-counter and prescription medicines only as told by your doctor.  Contact a doctor if your condition is not improving or is getting worse.  Keep all follow-up visits. This information is not intended to replace advice given to you by your health care provider. Make sure you discuss any questions you have with your health care provider. Document Revised: 09/27/2019 Document Reviewed: 09/27/2019 Elsevier Patient Education  2021 [employer]  

## 2020-09-13 NOTE — Progress Notes (Signed)
[name]; [id number]; Sep 28, 1940   HPI Patient is a 80yo wm who presents for evaluation and treatment of a bump on his neck.  Has been present for several months.  Did express some white thick fluid from it.  Bothers him that it is still present.  No fevers noted. Past Medical History:  Diagnosis Date  . Arthritis    "fingers; knees; toes" (01/05/2014)  . Barrett's esophagus   . BPH (benign prostatic hyperplasia)   . Bradycardia 2009   Sinus rhythm in the 40s without symptoms;  Marland Kitchen GERD (gastroesophageal reflux disease)    ulcerative/erosive  . Gout   . Hemorrhoids   . Hiatal hernia   . History of skin cancer   . Hx of adenomatous colonic polyps   . Hyperlipidemia   . Hypertension   . Hypothyroidism    Following partial thyroidectomy  . Squamous carcinoma    skin cancer benign arms and right ear and rightside lip  . Tobacco abuse, in remission    10-20 pack years; discontinued in 1981    Past Surgical History:  Procedure Laterality Date  . ABDOMINAL HERNIA REPAIR  1990's  . BIOPSY  04/13/2018   Procedure: BIOPSY;  Surgeon: Daneil Dolin, MD;  Location: [facility];  Service: Endoscopy;;  . BIOPSY  06/27/2019   Procedure: BIOPSY;  Surgeon: Daneil Dolin, MD;  Location: [facility];  Service: Endoscopy;;  Distal Esophagus biopsy  . CATARACT EXTRACTION W/ INTRAOCULAR LENS  IMPLANT, BILATERAL Bilateral 2012-2014  . COLONOSCOPY  12/2005   normal  . COLONOSCOPY  2004   tubulovillous adenoma of rectum   . COLONOSCOPY  01/12/2011   Dr. Gala Romney- normal rectum  . COLONOSCOPY N/A 07/09/2015   Procedure: COLONOSCOPY;  Surgeon: Daneil Dolin, MD;  Location: [facility];  Service: Endoscopy;  Laterality: N/A;  730   . COLONOSCOPY N/A 01/04/2019   Procedure: COLONOSCOPY;  Surgeon: Daneil Dolin, MD;  Location: [facility];  Service: Endoscopy;  Laterality: N/A;  10:45am  . ELBOW ARTHROSCOPY Left 2014  . ESOPHAGEAL DILATION N/A 02/13/2015   Procedure: ESOPHAGEAL DILATION;   Surgeon: Daneil Dolin, MD;  Location: [facility];  Service: Endoscopy;  Laterality: N/A;  . ESOPHAGOGASTRODUODENOSCOPY  01/2006   geographic erosive RE, noncritical peptic stricture s/p dilation, antral erosion  . ESOPHAGOGASTRODUODENOSCOPY  01/12/2011   Dr. Gala Romney- hiatal hernia, distal esophageal erosions, barretts esophagus  . ESOPHAGOGASTRODUODENOSCOPY  01/27/2012   Dr.Rourk- short segment barretts esophagus, erosive reflux esophagitis. bx= barretts esophagus, negative for dysplasia  . ESOPHAGOGASTRODUODENOSCOPY N/A 02/13/2015   Dr.Rourk- ulcerative/erosive reflux esophagitis, abnormal esophagitis c/w prior diagnosis of barretts esophagus, hiatal hernia. bx=barretts esophagus  . ESOPHAGOGASTRODUODENOSCOPY N/A 04/13/2018   Procedure: ESOPHAGOGASTRODUODENOSCOPY (EGD);  Surgeon: Daneil Dolin, MD;  Location: [facility];  Service: Endoscopy;  Laterality: N/A;  2:15pm  . ESOPHAGOGASTRODUODENOSCOPY N/A 06/27/2019   Procedure: ESOPHAGOGASTRODUODENOSCOPY (EGD);  Surgeon: Daneil Dolin, MD;  Location: [facility];  Service: Endoscopy;  Laterality: N/A;  11:15am  . HEMORRHOID SURGERY N/A 07/12/2015   Procedure: SIMPLE HEMORRHOIDECTOMY;  Surgeon: Aviva Signs, MD;  Location: [facility];  Service: General;  Laterality: N/A;  . [place]  01/12/2011   Procedure: [name] DILATION;  Surgeon: Daneil Dolin, MD;  Location: [facility];  Service: Endoscopy;  Laterality: N/A;  56 french  . MALONEY DILATION N/A 04/13/2018   Procedure: [name] DILATION;  Surgeon: Daneil Dolin, MD;  Location:  [facility];  Service: Endoscopy;  Laterality: N/A;  [name] DILATION N/A 06/27/2019   Procedure: [name] DILATION;  Surgeon: Daneil Dolin, MD;  Location: [facility];  Service: Endoscopy;  Laterality: N/A;  . SQUAMOUS CELL CARCINOMA EXCISION     "cut off my hands; left arm; under right lip; right ear" (01/05/2014)  . THYROIDECTOMY, PARTIAL Right 1969   lobe-nodule present  . TOTAL  KNEE ARTHROPLASTY Left 01/05/2014   Procedure: LEFT TOTAL KNEE ARTHROPLASTY;  Surgeon: Mcarthur Rossetti, MD;  Location: [place];  Service: Orthopedics;  Laterality: Left;  . TOTAL KNEE ARTHROPLASTY Right 05/04/2014   Procedure: RIGHT TOTAL KNEE ARTHROPLASTY;  Surgeon: Mcarthur Rossetti, MD;  Location: [facility];  Service: Orthopedics;  Laterality: Right;  . UMBILICAL HERNIA REPAIR  2000  . WISDOM TOOTH EXTRACTION  06/19/2020    Family History  Problem Relation Age of Onset  . Lung cancer Father 26  . Colon polyps Brother   . Colon cancer Neg Hx   . Liver disease Neg Hx     Current Outpatient Medications on File Prior to Visit  Medication Sig Dispense Refill  . allopurinol (ZYLOPRIM) 100 MG tablet Take 100 mg by mouth daily.    Marland Kitchen amLODipine (NORVASC) 10 MG tablet Take 10 mg by mouth every morning.    Marland Kitchen atorvastatin (LIPITOR) 40 MG tablet Take 40 mg by mouth at bedtime.    . chlorthalidone (HYGROTON) 25 MG tablet Take 25 mg by mouth daily.    Marland Kitchen desonide (DESOWEN) 0.05 % ointment Apply 1 application topically daily as needed for rash.    . fluorouracil (EFUDEX) 5 % cream Apply 1 application topically 2 (two) times daily.    . hydrALAZINE (APRESOLINE) 25 MG tablet Take 1 tablet (25 mg total) 2 (two) times daily by mouth. 60 tablet 6  . levothyroxine (SYNTHROID) 150 MCG tablet Take 1 tablet (150 mcg total) by mouth daily before breakfast. 90 tablet 3  . losartan (COZAAR) 50 MG tablet Take 50 mg by mouth daily.    Marland Kitchen METROCREAM 0.75 % cream Apply 1 application topically at bedtime. Apply to nose    . omeprazole (PRILOSEC) 40 MG capsule TAKE 1 CAPSULE BY MOUTH EVERY DAY 90 capsule 3  . tamsulosin (FLOMAX) 0.4 MG CAPS capsule Take 0.4 mg by mouth at bedtime.     . TOLAK 4 % CREA Apply 1 application topically at bedtime. Apply to forearms down to back of hands    . Vitamin D, Ergocalciferol, (DRISDOL) 1.25 MG (50000 UNIT) CAPS capsule Take 1 capsule (50,000 Units total) by mouth every 7  (seven) days. 12 capsule 0   No current facility-administered medications on file prior to visit.    Allergies  Allergen Reactions  . Meloxicam Swelling    Face and tongue swelling      Social History   Substance and Sexual Activity  Alcohol Use Yes   Comment: occ beer    Social History   Tobacco Use  Smoking Status Former Smoker  . Packs/day: 1.00  . Years: 20.00  . Pack years: 20.00  . Types: Cigarettes  . Start date: 07/06/1956  . Quit date: 07/06/1980  . Years since quitting: 40.2  Smokeless Tobacco Never Used  Tobacco Comment   "quit smoking in ~ 1982"    Review of Systems  Constitutional: Negative.   HENT: Negative.   Eyes: Negative.   Respiratory: Negative.   Cardiovascular: Negative.   Gastrointestinal: Negative.   Genitourinary: Negative.  Musculoskeletal: Negative.   Skin: Negative.   Neurological: Negative.   Endo/Heme/Allergies: Negative.   Psychiatric/Behavioral: Negative.     Objective   Vitals:   09/12/20 0855  BP: (!) 179/88  Pulse: 80  Resp: 14  Temp: (!) 97.5 F (36.4 C)  SpO2: 97%    Physical Exam Vitals reviewed.  Constitutional:      Appearance: Normal appearance. He is not ill-appearing.  HENT:     Head: Normocephalic and atraumatic.  Cardiovascular:     Rate and Rhythm: Normal rate and regular rhythm.     Heart sounds: Normal heart sounds. No murmur heard. No friction rub. No gallop.   Pulmonary:     Effort: Pulmonary effort is normal. No respiratory distress.     Breath sounds: Normal breath sounds. No stridor. No wheezing, rhonchi or rales.  Skin:    General: Skin is warm and dry.     Comments: Small 2-38mm pimple with punctum right lower anterior neck.  No erythema noted.  Neurological:     Mental Status: He is alert and oriented to person, place, and time.     Assessment  Sebaceous cyst, neck Plan   Continue try to express sebum from cyst.  No need for surgical intervention.  Return in one month for follow  up.

## 2020-10-08 ENCOUNTER — Encounter: Payer: Self-pay | Admitting: General Surgery

## 2020-10-08 ENCOUNTER — Other Ambulatory Visit: Payer: Self-pay

## 2020-10-08 ENCOUNTER — Ambulatory Visit (INDEPENDENT_AMBULATORY_CARE_PROVIDER_SITE_OTHER): Payer: Medicare Other | Admitting: General Surgery

## 2020-10-08 VITALS — BP 155/77 | HR 69 | Temp 97.6°F | Resp 16 | Ht 70.0 in | Wt 274.0 lb

## 2020-10-08 DIAGNOSIS — L723 Sebaceous cyst: Secondary | ICD-10-CM | POA: Diagnosis not present

## 2020-10-08 NOTE — Progress Notes (Signed)
Subjective:     [name]  Here for follow-up of sebaceous cyst on the neck.  Patient states is still there, but he has not expressed any fluid from it. Objective:    BP (!) 155/77   Pulse 69   Temp 97.6 F (36.4 C) (Other (Comment))   Resp 16   Ht 5\' 10"  (1.778 m)   Wt 274 lb (124.3 kg)   SpO2 92%   BMI 39.31 kg/m   General:  alert, cooperative and no distress  Skin examination reveals a 1 to 2 mm residual cyst with a scab present over it in the right lower anterior neck.  I was not able to express any further sebum from it.  No overlying skin changes are present.     Assessment:    Resolving sebaceous cyst, neck    Plan:   I reassured the patient that this should resolve with time and surgical intervention is not needed.  He understands and agrees.  He is following up with a dermatologist later this year.  Follow-up here as needed.

## 2020-10-10 ENCOUNTER — Ambulatory Visit: Payer: Medicare Other | Admitting: General Surgery

## 2020-11-28 LAB — COMPREHENSIVE METABOLIC PANEL
ALT: 24 IU/L (ref 0–44)
AST: 24 IU/L (ref 0–40)
Albumin/Globulin Ratio: 2.3 — ABNORMAL HIGH (ref 1.2–2.2)
Albumin: 4.8 g/dL — ABNORMAL HIGH (ref 3.7–4.7)
Alkaline Phosphatase: 82 IU/L (ref 44–121)
BUN/Creatinine Ratio: 15 (ref 10–24)
BUN: 19 mg/dL (ref 8–27)
Bilirubin Total: 0.6 mg/dL (ref 0.0–1.2)
CO2: 24 mmol/L (ref 20–29)
Calcium: 9.5 mg/dL (ref 8.6–10.2)
Chloride: 103 mmol/L (ref 96–106)
Creatinine, Ser: 1.28 mg/dL — ABNORMAL HIGH (ref 0.76–1.27)
Globulin, Total: 2.1 g/dL (ref 1.5–4.5)
Glucose: 101 mg/dL — ABNORMAL HIGH (ref 65–99)
Potassium: 3.7 mmol/L (ref 3.5–5.2)
Sodium: 144 mmol/L (ref 134–144)
Total Protein: 6.9 g/dL (ref 6.0–8.5)
eGFR: 57 mL/min/{1.73_m2} — ABNORMAL LOW (ref >59)

## 2020-11-28 LAB — LIPID PANEL
Chol/HDL Ratio: 4.1 ratio (ref 0.0–5.0)
Cholesterol, Total: 127 mg/dL (ref 100–199)
HDL: 31 mg/dL — ABNORMAL LOW (ref >39)
LDL Chol Calc (NIH): 74 mg/dL (ref 0–99)
Triglycerides: 121 mg/dL (ref 0–149)
VLDL Cholesterol Cal: 22 mg/dL (ref 5–40)

## 2020-11-28 LAB — T4, FREE: Free T4: 1.49 ng/dL (ref 0.82–1.77)

## 2020-11-28 LAB — TSH: TSH: 2.38 u[IU]/mL (ref 0.450–4.500)

## 2020-12-09 ENCOUNTER — Other Ambulatory Visit: Payer: Self-pay

## 2020-12-09 ENCOUNTER — Ambulatory Visit (INDEPENDENT_AMBULATORY_CARE_PROVIDER_SITE_OTHER): Payer: Medicare Other | Admitting: Nurse Practitioner

## 2020-12-09 ENCOUNTER — Encounter: Payer: Self-pay | Admitting: Nurse Practitioner

## 2020-12-09 VITALS — BP 153/82 | HR 71 | Ht 70.0 in | Wt 276.0 lb

## 2020-12-09 DIAGNOSIS — E559 Vitamin D deficiency, unspecified: Secondary | ICD-10-CM | POA: Diagnosis not present

## 2020-12-09 DIAGNOSIS — E782 Mixed hyperlipidemia: Secondary | ICD-10-CM

## 2020-12-09 DIAGNOSIS — E890 Postprocedural hypothyroidism: Secondary | ICD-10-CM | POA: Diagnosis not present

## 2020-12-09 MED ORDER — HYDRALAZINE HCL 25 MG PO TABS: 25.0000 mg | ORAL_TABLET | Freq: Two times a day (BID) | ORAL | 3 refills | Status: DC

## 2020-12-09 MED ORDER — LOSARTAN POTASSIUM 50 MG PO TABS: 50.0000 mg | ORAL_TABLET | Freq: Every day | ORAL | 3 refills | Status: DC

## 2020-12-09 MED ORDER — AMLODIPINE BESYLATE 10 MG PO TABS: 10.0000 mg | ORAL_TABLET | Freq: Every morning | ORAL | 3 refills | Status: DC

## 2020-12-09 MED ORDER — LEVOTHYROXINE SODIUM 150 MCG PO TABS: 150.0000 ug | ORAL_TABLET | Freq: Every day | ORAL | 3 refills | Status: AC

## 2020-12-09 MED ORDER — CHLORTHALIDONE 25 MG PO TABS: 25.0000 mg | ORAL_TABLET | Freq: Every day | ORAL | 3 refills | Status: DC

## 2020-12-09 NOTE — Progress Notes (Signed)
05/02/19      Endocrinology follow-up note   Subjective:    Patient ID: [name], male    DOB: 10/06/1940, PCP Michell Heinrich, DO   Past Medical History:  Diagnosis Date  . Arthritis    "fingers; knees; toes" (01/05/2014)  . Barrett's esophagus   . BPH (benign prostatic hyperplasia)   . Bradycardia 2009   Sinus rhythm in the 40s without symptoms;  Marland Kitchen GERD (gastroesophageal reflux disease)    ulcerative/erosive  . Gout   . Hemorrhoids   . Hiatal hernia   . History of skin cancer   . Hx of adenomatous colonic polyps   . Hyperlipidemia   . Hypertension   . Hypothyroidism    Following partial thyroidectomy  . Squamous carcinoma    skin cancer benign arms and right ear and rightside lip  . Tobacco abuse, in remission    10-20 pack years; discontinued in 1981   Past Surgical History:  Procedure Laterality Date  . ABDOMINAL HERNIA REPAIR  1990's  . BIOPSY  04/13/2018   Procedure: BIOPSY;  Surgeon: Daneil Dolin, MD;  [facility];  Service: Endoscopy;;  . BIOPSY  06/27/2019   Procedure: BIOPSY;  Surgeon: Daneil Dolin, MD;  [facility];  Service: Endoscopy;;  Distal Esophagus biopsy  . CATARACT EXTRACTION W/ INTRAOCULAR LENS  IMPLANT, BILATERAL Bilateral 2012-2014  . COLONOSCOPY  12/2005   normal  . COLONOSCOPY  2004   tubulovillous adenoma of rectum   . COLONOSCOPY  01/12/2011   Dr. Gala Romney- normal rectum  . COLONOSCOPY N/A 07/09/2015   Procedure: COLONOSCOPY;  Surgeon: Daneil Dolin, MD;  [facility];  Service: Endoscopy;  Laterality: N/A;  730   . COLONOSCOPY N/A 01/04/2019   Procedure: COLONOSCOPY;  Surgeon: Daneil Dolin, MD;  [facility];  Service: Endoscopy;  Laterality: N/A;  10:45am  . ELBOW ARTHROSCOPY Left 2014  . ESOPHAGEAL DILATION N/A 02/13/2015   Procedure: ESOPHAGEAL DILATION;  Surgeon: Daneil Dolin, MD;  [facility];  Service: Endoscopy;  Laterality: N/A;  . ESOPHAGOGASTRODUODENOSCOPY   01/2006   geographic erosive RE, noncritical peptic stricture s/p dilation, antral erosion  . ESOPHAGOGASTRODUODENOSCOPY  01/12/2011   Dr. Gala Romney- hiatal hernia, distal esophageal erosions, barretts esophagus  . ESOPHAGOGASTRODUODENOSCOPY  01/27/2012   Dr.Rourk- short segment barretts esophagus, erosive reflux esophagitis. bx= barretts esophagus, negative for dysplasia  . ESOPHAGOGASTRODUODENOSCOPY N/A 02/13/2015   Dr.Rourk- ulcerative/erosive reflux esophagitis, abnormal esophagitis c/w prior diagnosis of barretts esophagus, hiatal hernia. bx=barretts esophagus  . ESOPHAGOGASTRODUODENOSCOPY N/A 04/13/2018   Procedure: ESOPHAGOGASTRODUODENOSCOPY (EGD);  Surgeon: Daneil Dolin, MD;  [facility];  Service: Endoscopy;  Laterality: N/A;  2:15pm  . ESOPHAGOGASTRODUODENOSCOPY N/A 06/27/2019   Procedure: ESOPHAGOGASTRODUODENOSCOPY (EGD);  Surgeon: Daneil Dolin, MD;  [facility];  Service: Endoscopy;  Laterality: N/A;  11:15am  . HEMORRHOID SURGERY N/A 07/12/2015   Procedure: SIMPLE HEMORRHOIDECTOMY;  Surgeon: Aviva Signs, MD;  Location: [facility];  Service: General;  Laterality: N/A;  . [place]  01/12/2011   Procedure: [name] DILATION;  Surgeon: Daneil Dolin, MD;  [facility];  Service: Endoscopy;  Laterality: N/A;  56 french  . MALONEY DILATION N/A 04/13/2018   Procedure: [name] DILATION;  Surgeon: Daneil Dolin, MD;  [facility];  Service: Endoscopy;  Laterality: N/A;  . MALONEY DILATION N/A 06/27/2019   Procedure: [name] DILATION;  Surgeon:  Rourk, [name], MD;  [facility];  Service: Endoscopy;  Laterality: N/A;  . SQUAMOUS CELL CARCINOMA EXCISION     "cut off my hands; left arm; under right lip; right ear" (01/05/2014)  . THYROIDECTOMY, PARTIAL Right 1969   lobe-nodule present  . TOTAL KNEE ARTHROPLASTY Left 01/05/2014   Procedure: LEFT TOTAL KNEE ARTHROPLASTY;  Surgeon: Mcarthur Rossetti, MD;  Location: [facility];  Service: Orthopedics;  Laterality: Left;  . TOTAL KNEE ARTHROPLASTY Right 05/04/2014   Procedure: RIGHT TOTAL KNEE ARTHROPLASTY;  Surgeon: Mcarthur Rossetti, MD;  Location: [facility];  Service: Orthopedics;  Laterality: Right;  . UMBILICAL HERNIA REPAIR  2000  . WISDOM TOOTH EXTRACTION  06/19/2020   Social History   Socioeconomic History  . Marital status: Married    Spouse name: Not on file  . Number of children: 2  . Years of education: Not on file  . Highest education level: Not on file  Occupational History  . Occupation: [employer]    Comment: Current employment  . Occupation: Engineer, civil (consulting)    Comment: Prior employment  Tobacco Use  . Smoking status: Former Smoker    Packs/day: 1.00    Years: 20.00    Pack years: 20.00    Types: Cigarettes    Start date: 07/06/1956    Quit date: 07/06/1980    Years since quitting: 40.4  . Smokeless tobacco: Never Used  . Tobacco comment: "quit smoking in ~ 1982"  Vaping Use  . Vaping Use: Never used  Substance and Sexual Activity  . Alcohol use: Yes    Comment: occ beer  . Drug use: No  . Sexual activity: Not on file  Other Topics Concern  . Not on file  Social History Narrative  . Not on file   Social Determinants of Health   Financial Resource Strain: Not on file  Food Insecurity: Not on file  Transportation Needs: Not on file  Physical Activity: Not on file  Stress: Not on file  Social Connections: Not on file   Outpatient Encounter Medications as of 12/09/2020  Medication Sig  . allopurinol (ZYLOPRIM) 100 MG tablet Take 100 mg by mouth daily.  Marland Kitchen amLODipine (NORVASC) 10 MG tablet Take 1 tablet (10 mg total) by mouth every morning.  Marland Kitchen atorvastatin (LIPITOR) 40 MG tablet Take 40 mg by mouth at bedtime.  . chlorthalidone (HYGROTON) 25 MG tablet Take 1 tablet (25 mg total) by mouth daily.  Marland Kitchen desonide (DESOWEN) 0.05 % ointment Apply 1 application topically daily as needed for rash.  . fluorouracil (EFUDEX) 5 %  cream Apply 1 application topically 2 (two) times daily.  . hydrALAZINE (APRESOLINE) 25 MG tablet Take 1 tablet (25 mg total) by mouth 2 (two) times daily.  . indomethacin (INDOCIN) 50 MG capsule Take 50 mg by mouth daily.  Marland Kitchen levothyroxine (SYNTHROID) 150 MCG tablet Take 1 tablet (150 mcg total) by mouth daily before breakfast.  . losartan (COZAAR) 50 MG tablet Take 1 tablet (50 mg total) by mouth daily.  Marland Kitchen METROCREAM 0.75 % cream Apply 1 application topically at bedtime. Apply to nose  . omeprazole (PRILOSEC) 40 MG capsule TAKE 1 CAPSULE BY MOUTH EVERY DAY  . tamsulosin (FLOMAX) 0.4 MG CAPS capsule Take 0.4 mg by mouth at bedtime.   . TOLAK 4 % CREA Apply 1 application topically at bedtime. Apply to forearms down to back of hands  . Vitamin D, Ergocalciferol, (DRISDOL) 1.25 MG (50000 UNIT) CAPS capsule Take 1  capsule (50,000 Units total) by mouth every 7 (seven) days.  . [DISCONTINUED] amLODipine (NORVASC) 10 MG tablet Take 10 mg by mouth every morning.  . [DISCONTINUED] chlorthalidone (HYGROTON) 25 MG tablet Take 25 mg by mouth daily.  . [DISCONTINUED] hydrALAZINE (APRESOLINE) 25 MG tablet Take 1 tablet (25 mg total) 2 (two) times daily by mouth.  . [DISCONTINUED] levothyroxine (SYNTHROID) 150 MCG tablet Take 1 tablet (150 mcg total) by mouth daily before breakfast.  . [DISCONTINUED] losartan (COZAAR) 50 MG tablet Take 50 mg by mouth daily.   No facility-administered encounter medications on file as of 12/09/2020.   ALLERGIES: Allergies  Allergen Reactions  . Meloxicam Swelling    Face and tongue swelling      VACCINATION STATUS: Immunization History  Administered Date(s) Administered  . 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  He has family history of thyroid dysfunction.  He denies family history of thyroid cancer. ) visit. Symptoms include weight  gain. Patient reports no anxiety, cold intolerance, constipation, depressed mood, diarrhea, fatigue, heat intolerance, leg swelling, palpitations, tremors or weight loss. The symptoms have been stable.  Hypertension This is a chronic problem. The current episode started more than 1 year ago. The problem is unchanged. The problem is controlled. Pertinent negatives include no palpitations. Agents associated with hypertension include thyroid hormones. Risk factors for coronary artery disease include dyslipidemia, obesity, male gender, sedentary lifestyle and smoking/tobacco exposure. Past treatments include angiotensin blockers, calcium channel blockers, diuretics and direct vasodilators. The current treatment provides moderate improvement. There are no compliance problems.  Hypertensive end-organ damage includes kidney disease. Identifiable causes of hypertension include chronic renal disease and a thyroid problem.   Review of systems  Constitutional: + Minimally fluctuating body weight,  current Body mass index is 39.6 kg/m. , no fatigue, no subjective hyperthermia, no subjective hypothermia Eyes: no blurry vision, no xerophthalmia ENT: no sore throat, no nodules palpated in throat, no dysphagia/odynophagia, no hoarseness Cardiovascular: no chest pain, no shortness of breath, no palpitations, + ankle swelling Respiratory: no cough, no shortness of breath Gastrointestinal: no nausea/vomiting/diarrhea Musculoskeletal: no muscle/joint aches Skin: no rashes, no hyperemia Neurological: no tremors, no numbness, no tingling, no dizziness Psychiatric: no depression, no anxiety   Objective:    BP (!) 153/82   Pulse 71   Ht '5\' 10"'  (1.778 m)   Wt 276 lb (125.2 kg)   BMI 39.60 kg/m   Wt Readings from Last 3 Encounters:  12/09/20 276 lb (125.2 kg)  10/08/20 274 lb (124.3 kg)  09/12/20 271 lb (122.9 kg)    BP Readings from Last 3 Encounters:  12/09/20 (!) 153/82  10/08/20 (!) 155/77  09/12/20 (!)  179/88     Physical Exam- Limited  Constitutional:  Body mass index is 39.6 kg/m. , not in acute distress, normal state of mind Eyes:  EOMI, no exophthalmos Neck: Supple Cardiovascular: RRR, no murmurs, rubs, or gallops, + nonpitting ankle edema Respiratory: Adequate breathing efforts, no crackles, rales, rhonchi, or wheezing Musculoskeletal: no gross deformities, strength intact in all four extremities, no gross restriction of joint movements Skin:  no rashes, no hyperemia Neurological: no tremor with outstretched hands   CMP ( most recent) CMP     Component Value Date/Time   NA 144 11/27/2020 1149   K 3.7 11/27/2020 1149   CL 103 11/27/2020 1149   CO2 24 11/27/2020 1149   GLUCOSE 101 (H) 11/27/2020  1149   GLUCOSE 100 (H) 07/10/2015 1100   BUN 19 11/27/2020 1149   CREATININE 1.28 (H) 11/27/2020 1149   CREATININE 1.14 08/31/2012 1149   CALCIUM 9.5 11/27/2020 1149   PROT 6.9 11/27/2020 1149   ALBUMIN 4.8 (H) 11/27/2020 1149   AST 24 11/27/2020 1149   ALT 24 11/27/2020 1149   ALKPHOS 82 11/27/2020 1149   BILITOT 0.6 11/27/2020 1149   GFRNONAA 57 (L) 07/10/2015 1100   GFRAA >60 07/10/2015 1100     Lipid Panel ( most recent) Lipid Panel     Component Value Date/Time   CHOL 127 11/27/2020 1149   TRIG 121 11/27/2020 1149   HDL 31 (L) 11/27/2020 1149   CHOLHDL 4.1 11/27/2020 1149   CHOLHDL 6.4 08/31/2012 1149   VLDL 52 (H) 08/31/2012 1149   LDLCALC 74 11/27/2020 1149   Recent Results (from the past 2160 hour(s))  TSH     Status: None   Collection Time: 11/27/20 11:49 AM  Result Value Ref Range   TSH 2.380 0.450 - 4.500 uIU/mL  T4, free     Status: None   Collection Time: 11/27/20 11:49 AM  Result Value Ref Range   Free T4 1.49 0.82 - 1.77 ng/dL  Lipid panel     Status: Abnormal   Collection Time: 11/27/20 11:49 AM  Result Value Ref Range   Cholesterol, Total 127 100 - 199 mg/dL   Triglycerides 121 0 - 149 mg/dL   HDL 31 (L) >39 mg/dL   VLDL Cholesterol Cal  22 5 - 40 mg/dL   LDL Chol Calc (NIH) 74 0 - 99 mg/dL   Chol/HDL Ratio 4.1 0.0 - 5.0 ratio    Comment:                                   T. Chol/HDL Ratio                                             Men  Women                               1/2 Avg.Risk  3.4    3.3                                   Avg.Risk  5.0    4.4                                2X Avg.Risk  9.6    7.1                                3X Avg.Risk 23.4   11.0   Comprehensive metabolic panel     Status: Abnormal   Collection Time: 11/27/20 11:49 AM  Result Value Ref Range   Glucose 101 (H) 65 - 99 mg/dL   BUN 19 8 - 27 mg/dL   Creatinine, Ser 1.28 (H) 0.76 - 1.27 mg/dL   eGFR 57 (L) >59 mL/min/1.73   BUN/Creatinine Ratio 15 10 - 24   Sodium 144  134 - 144 mmol/L   Potassium 3.7 3.5 - 5.2 mmol/L   Chloride 103 96 - 106 mmol/L   CO2 24 20 - 29 mmol/L   Calcium 9.5 8.6 - 10.2 mg/dL   Total Protein 6.9 6.0 - 8.5 g/dL   Albumin 4.8 (H) 3.7 - 4.7 g/dL   Globulin, Total 2.1 1.5 - 4.5 g/dL   Albumin/Globulin Ratio 2.3 (H) 1.2 - 2.2   Bilirubin Total 0.6 0.0 - 1.2 mg/dL   Alkaline Phosphatase 82 44 - 121 IU/L   AST 24 0 - 40 IU/L   ALT 24 0 - 44 IU/L     Labs from April 07, 2018: Total T4 14.2 high, free T4 4.4 high, TSH 0.22 suppressed Labs from 09/28/2016 showed TSH 5.39 elevated, free T3 3 0.3, total T4 11.3. 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  Assessment & Plan:   1. Postsurgical hypothyroidism -His previsit thyroid function tests are consistent with appropriate hormone replacement.  He is advised to continue Levothyroxine 150 mcg po daily before breakfast.      - We discussed about the correct intake of his thyroid hormone, on empty stomach at fasting, with water, separated by at least 30 minutes from breakfast and other medications,  and separated by more than 4 hours from calcium, iron, multivitamins, acid reflux medications  (PPIs). -Patient is made aware of the fact that thyroid hormone replacement is needed for life, dose to be adjusted by periodic monitoring of thyroid function tests.  2. History of goiter status post partial thyroidectomy: - His repeat thyroid/neck ultrasound from 03/09/2017 is remarkable for surgical absent right lobe, 2.9 cm remnant left lobe with no discrete nodules or lesions. He will not need any intervention or dedicated follow up at this time.  3. Vitamin D deficiency -His most recent Vitamin D level was 34 on 04/29/20.  He is currently taking OTC vitamin D3 5000 units daily.  Will recheck vitamin D level prior to next visit.  4. Hypertension His blood pressure is controlled to target for his age.  He is advised to continue Norvasc 10 mg po daily, Chlorthalidone 25 mg po daily, Hydralazine 25 mg po twice daily, Losartan 50 mg po daily, and HCTZ 50 mg po daily at bedtime (added by his PCP for ankle swelling).   - I advised patient to maintain close follow up with Michell Heinrich, DO for primary care needs.     I spent 30 minutes in the care of the patient today including review of labs from Thyroid Function, CMP, and other relevant labs ; imaging/biopsy records (current and previous including abstractions from other facilities); face-to-face time discussing  his lab results and symptoms, medications doses, his options of short and long term treatment based on the latest standards of care / guidelines;   and documenting the encounter.  Cottie Banda Offerdahl  participated in the discussions, expressed understanding, and voiced agreement with the above plans.  All questions were answered to his satisfaction. he is encouraged to contact clinic should he have any questions or concerns prior to his return visit.    Follow up plan: Return in about 6 months (around 06/10/2021) for Thyroid follow up, Previsit labs.  Rayetta Pigg, [facility] [address] [place], [place] [address] Phone: [phone] Fax: [phone]  12/09/2020, 8:29 AM

## 2020-12-09 NOTE — Patient Instructions (Signed)

## 2021-04-26 ENCOUNTER — Other Ambulatory Visit: Payer: Self-pay | Admitting: Gastroenterology

## 2021-04-26 DIAGNOSIS — K2100 Gastro-esophageal reflux disease with esophagitis, without bleeding: Secondary | ICD-10-CM

## 2021-04-26 DIAGNOSIS — K2270 Barrett's esophagus without dysplasia: Secondary | ICD-10-CM

## 2021-04-26 DIAGNOSIS — D126 Benign neoplasm of colon, unspecified: Secondary | ICD-10-CM

## 2021-06-10 ENCOUNTER — Ambulatory Visit: Payer: Medicare Other | Admitting: Nurse Practitioner

## 2021-07-01 ENCOUNTER — Encounter: Payer: Self-pay | Admitting: Internal Medicine

## 2021-08-08 ENCOUNTER — Encounter: Payer: Self-pay | Admitting: Gastroenterology

## 2021-08-08 ENCOUNTER — Ambulatory Visit (INDEPENDENT_AMBULATORY_CARE_PROVIDER_SITE_OTHER): Payer: Medicare Other | Admitting: Gastroenterology

## 2021-08-08 ENCOUNTER — Other Ambulatory Visit: Payer: Self-pay

## 2021-08-08 VITALS — BP 160/90 | HR 70 | Temp 97.7°F | Ht 70.0 in | Wt 275.4 lb

## 2021-08-08 DIAGNOSIS — D126 Benign neoplasm of colon, unspecified: Secondary | ICD-10-CM

## 2021-08-08 DIAGNOSIS — K2270 Barrett's esophagus without dysplasia: Secondary | ICD-10-CM | POA: Diagnosis not present

## 2021-08-08 DIAGNOSIS — K2100 Gastro-esophageal reflux disease with esophagitis, without bleeding: Secondary | ICD-10-CM

## 2021-08-08 DIAGNOSIS — R151 Fecal smearing: Secondary | ICD-10-CM | POA: Insufficient documentation

## 2021-08-08 MED ORDER — HYDROCORTISONE (PERIANAL) 2.5 % EX CREA: 1.0000 "application " | TOPICAL_CREAM | Freq: Three times a day (TID) | CUTANEOUS | 1 refills | Status: AC

## 2021-08-08 MED ORDER — OMEPRAZOLE 40 MG PO CPDR: DELAYED_RELEASE_CAPSULE | ORAL | 3 refills | Status: DC

## 2021-08-08 NOTE — Patient Instructions (Addendum)
Continue omeprazole 40mg  once daily before evening meal. RX sent to your pharmacy. Start Anusol hemorrhoids cream. RX sent to your pharmacy. Apply small amount of cream just inside the anal opening three times day for 10 days. Try to keep area dry and clean. Take your fiber chewable medication every day unless you start having diarrhea. Make sure you are taking at least 3-4 grams of fiber each day, check your bottle at home, you may need to take two chewable instead one 1.  Call in 2 weeks and let me know how you are doing.

## 2021-08-08 NOTE — Progress Notes (Signed)
Primary Care Physician: Michell Heinrich, DO  Primary Gastroenterologist:  Garfield Cornea, MD   Chief Complaint  Patient presents with   rectal leakage   stomach gurgling    HPI: [name] is a 81 y.o. male here for further evaluation of rectal leakage. Last seen 06/2020. He has h/o GERD, Barrett's esophagus, C.diff.   Patient presents today with concerns regarding rectal leakage.  Symptoms occurring for 3 to 4 months.  Denies any constipation.  He has a bowel movement every day.  Sometimes several hours after BM, he will note burning around the anal rectal area.  He will go to the bathroom and wipe, will note stool on the tissue, small amount.  No issues of staining his undergarments.  Symptoms occur every day.  Denies any rectal bleeding, rectal pain, abdominal pain, unintentional weight loss.  Bowel movements otherwise are normal.  No straining.  No loose stools.  Stomach is noisy but not painful.  A lot of gas.  Denies any reflux issues.  Symptoms well controlled on a med resolved.  He typically takes before dinner.  No dysphagia.  No vomiting. No NSAIDs/ASA.  EGD December 2020: - Salmon-colored mucosa. Dilated /subsequent biopsy - Small hiatal hernia. - Normal duodenal bulb and second portion of the duodenum. -Esophageal biopsy showed gastroesophageal mucosal with mild inflammation consistent with reflux.  No intestinal metaplasia, dysplasia or malignancy. -Biopsy in 2019 consistent with Barrett's and at that time PPI was recommended but no future endoscopy unless new symptoms develop.  Colonoscopy July 2020: -Two 4 to 6 mm polyps in the descending colon and in the cecum, removed with a cold snare. Resected and retrieved. - The examination was otherwise normal on direct and retroflexion views. -2 tubular adenomas removed -No future colonoscopies planned due to age symptoms develop     Current Outpatient Medications  Medication Sig Dispense Refill   allopurinol  (ZYLOPRIM) 100 MG tablet Take 100 mg by mouth daily.     amLODipine (NORVASC) 10 MG tablet Take 1 tablet (10 mg total) by mouth every morning. 90 tablet 3   atorvastatin (LIPITOR) 40 MG tablet Take 40 mg by mouth at bedtime.     Cholecalciferol (VITAMIN D3) 125 MCG (5000 UT) CAPS Take by mouth.     hydrocortisone (ANUSOL-HC) 2.5 % rectal cream Place 1 application rectally 3 (three) times daily for 10 days. 30 g 1   levothyroxine (SYNTHROID) 150 MCG tablet Take 1 tablet (150 mcg total) by mouth daily before breakfast. 90 tablet 3   tamsulosin (FLOMAX) 0.4 MG CAPS capsule Take 0.4 mg by mouth at bedtime.      valsartan (DIOVAN) 160 MG tablet Take 160 mg by mouth daily.     vitamin C (ASCORBIC ACID) 500 MG tablet Take 500 mg by mouth daily.     omeprazole (PRILOSEC) 40 MG capsule TAKE 1 CAPSULE BY MOUTH EVERY DAY 90 capsule 3   No current facility-administered medications for this visit.    Allergies as of 08/08/2021 - Review Complete 08/08/2021  Allergen Reaction Noted   Meloxicam Swelling 06/20/2020    ROS:  General: Negative for anorexia, weight loss, fever, chills, fatigue, weakness. ENT: Negative for hoarseness, difficulty swallowing , nasal congestion. CV: Negative for chest pain, angina, palpitations, dyspnea on exertion, peripheral edema.  Respiratory: Negative for dyspnea at rest, dyspnea on exertion, cough, sputum, wheezing.  GI: See history of present illness. GU:  Negative for dysuria, hematuria, urinary incontinence, urinary frequency, nocturnal urination.  Endo: Negative for unusual weight change.    Physical Examination:   BP (!) 160/90    Pulse 70    Temp 97.7 F (36.5 C)    Ht 5\' 10"  (1.778 m)    Wt 275 lb 6.4 oz (124.9 kg)    BMI 39.52 kg/m   General: Well-nourished, well-developed in no acute distress.  Eyes: No icterus. Mouth: masked Lungs: Clear to auscultation bilaterally.  Heart: Regular rate and rhythm, no murmurs rubs or gallops.  Abdomen: Bowel sounds  are normal, nontender, nondistended, +bowel sounds Rectal: small skin tag noted externally, perianal area moist. Patient reported using vasoline.  Nontender rectal exam,good tone, ?Palpable tiny internal hemorrhoid within the anal canal.  Secretions brown heme-negative.  No masses or impaction. Extremities: No lower extremity edema. No clubbing or deformities. Neuro: Alert and oriented x 4   Skin: Warm and dry, no jaundice.   Psych: Alert and cooperative, normal mood and affect.  Labs:  Lab Results  Component Value Date   CREATININE 1.28 (H) 11/27/2020   BUN 19 11/27/2020   NA 144 11/27/2020   K 3.7 11/27/2020   CL 103 11/27/2020   CO2 24 11/27/2020    Lab Results  Component Value Date   ALT 24 11/27/2020   AST 24 11/27/2020   ALKPHOS 82 11/27/2020   BILITOT 0.6 11/27/2020    Imaging Studies: No results found.   Assessment:  Fecal smearing: noted for 3-4 months, without other symptoms. DRE unremarkable. Colonoscopy 2020 as outlined above.  Symptoms may be secondary to internal hemorrhoids.  Discussed at length with patient, plan to treat conservatively, but if no significant improvement in symptoms, then could consider direct visualization.  GERD/Barrett's: Symptoms well controlled with omeprazole once daily.  No alarm features.  Barrett's diagnosed in 2019 confirmed with biopsy.  Biopsies in 2020 with findings mostly consistent with GERD.  Per Dr. Gala Romney, no plans for repeat EGD unless develops new symptoms.   Plan: Omeprazole 40mg  daily before evening meal.  Prescription refilled. Start Anusol cream, apply anal rectally 3 times daily for 10 days.  Try to keep perianal area clean and dry. Increase fiber supplement to achieve 3 to 4 g of fiber each day. Call in 2 weeks with a progress report.  If symptoms fail to improve, he may need direct visualization.

## 2021-08-10 ENCOUNTER — Encounter: Payer: Self-pay | Admitting: Gastroenterology

## 2021-08-19 ENCOUNTER — Telehealth: Payer: Self-pay | Admitting: Gastroenterology

## 2021-08-19 NOTE — Telephone Encounter (Signed)
Pt states that the hydrocortisone cream is not working. Pt states that he has seen a little bit of blood when he wiped after a couple of his bm's. Pt is having normal bm's and has not been having to strain.

## 2021-08-19 NOTE — Telephone Encounter (Signed)
Patient called and said that the medication that was prescribed did not work and he is also seeing some bleeding.  Can something else be tried.  He uses [facility] in [place]

## 2021-08-20 NOTE — Telephone Encounter (Signed)
I think we should try hemorrhoid banding to see if this helps his bleeding and fecal smearing. Suspected palpable internal hemorrhoid on recent rectal exam. Colonoscopy up to date. If he is willing, please set him up for banding with Vicente Males.

## 2021-08-20 NOTE — Telephone Encounter (Signed)
Pt states that he is interested in trying the banding. Routing to the front to schedule.

## 2021-08-20 NOTE — Telephone Encounter (Signed)
Banding appt made with AB for 09/04/2021 at 230 pm and appt card mailed

## 2021-09-04 ENCOUNTER — Ambulatory Visit (INDEPENDENT_AMBULATORY_CARE_PROVIDER_SITE_OTHER): Payer: Medicare Other | Admitting: Gastroenterology

## 2021-09-04 ENCOUNTER — Encounter: Payer: Self-pay | Admitting: Gastroenterology

## 2021-09-04 ENCOUNTER — Other Ambulatory Visit: Payer: Self-pay

## 2021-09-04 VITALS — BP 160/90 | HR 71 | Temp 97.8°F | Ht 70.0 in | Wt 276.8 lb

## 2021-09-04 DIAGNOSIS — K641 Second degree hemorrhoids: Secondary | ICD-10-CM

## 2021-09-04 NOTE — Progress Notes (Signed)
? ? ? ? ?  [place] BANDING PROCEDURE NOTE ? ?[name] is a 81 y.o. male presenting today for consideration of hemorrhoid banding. Last colonoscopy July 2020: tubular adenomas. His main complaint is of fecal smearing. He has no bleeding.  ? ? ?The patient presents with symptomatic grade 2 hemorrhoids, unresponsive to maximal medical therapy, requesting rubber band ligation of his hemorrhoidal disease. All risks, benefits, and alternative forms of therapy were described and informed consent was obtained. ? ?In the left lateral decubitus position, anoscopic examination revealed grade 2-3 hemorrhoids in all three columns.  ? ?The decision was made to band the left lateral internal hemorrhoid, and the [place] was used to perform band ligation without complication. Digital anorectal examination was then performed to assure proper positioning of the band, and to adjust the banded tissue as required. The patient was discharged home without pain or other issues. Dietary and behavioral recommendations were given, along with follow-up instructions. The patient will return in several weeks for followup and possible additional banding as required. ? ?No complications were encountered and the patient tolerated the procedure well.  ? ?Annitta Needs, PhD, ANP-BC ?[facility]  ? ? ?

## 2021-09-04 NOTE — Patient Instructions (Signed)
I recommend increasing fiber intake. ? ?Please continue to avoid straining. ? ?You should limit your toilet time to 2-3 minutes at the most.  ? ?Continue to avoid constipation. ? ?Please call me with any concerns or issues! ? ?I will see you in follow-up for additional banding in several weeks. ? ?It was a pleasure to see you today. I want to create trusting relationships with patients to provide genuine, compassionate, and quality care. I value your feedback. If you receive a survey regarding your visit,  I greatly appreciate you taking time to fill this out.  ? ?Annitta Needs, PhD, ANP-BC ?[facility]  ? ? ? ? ? ? ? ?

## 2021-09-18 ENCOUNTER — Encounter: Payer: Self-pay | Admitting: Gastroenterology

## 2021-09-18 ENCOUNTER — Other Ambulatory Visit: Payer: Self-pay

## 2021-09-18 ENCOUNTER — Ambulatory Visit (INDEPENDENT_AMBULATORY_CARE_PROVIDER_SITE_OTHER): Payer: Medicare Other | Admitting: Gastroenterology

## 2021-09-18 VITALS — BP 170/78 | HR 80 | Temp 97.7°F | Ht 70.0 in | Wt 278.2 lb

## 2021-09-18 DIAGNOSIS — K641 Second degree hemorrhoids: Secondary | ICD-10-CM | POA: Diagnosis not present

## 2021-09-18 NOTE — Progress Notes (Signed)
? ? ?  [place] BANDING PROCEDURE NOTE ? ?[name] is an 81 y.o. male presenting today for consideration of hemorrhoid banding. Last colonoscopy July 2020: tubular adenomas. His main complaint is of fecal smearing. He has no bleeding. He has had left lateral banding.  ? ? ?The patient presents with symptomatic grade 2 hemorrhoids, unresponsive to maximal medical therapy, requesting rubber band ligation of his/her hemorrhoidal disease. All risks, benefits, and alternative forms of therapy were described and informed consent was obtained. ? ?The decision was made to band the right posterior internal hemorrhoid, and the [place] was used to perform band ligation without complication. However, multiple attempts to place ligator due to tightening of sphincter. The ligator's best placement was actually not advancing to the marked line but just prior to the line marking due to his anatomy. Digital anorectal examination was then performed to assure proper positioning of the band, and to adjust the banded tissue as required. The patient was discharged home without pain or other issues. Dietary and behavioral recommendations were given, along with follow-up instructions. The patient will return in several weeks for followup and possible additional banding as required. ? ?No complications were encountered and the patient tolerated the procedure well.  ? ?Annitta Needs, PhD, ANP-BC ?[facility]  ? ?

## 2021-09-18 NOTE — Patient Instructions (Signed)
We will see you in follow-up for your final banding in a few weeks! ? ?Continue to avoid straining, limit toilet time to 2-3 minutes. ? ?I enjoyed seeing you again today! As you know, I value our relationship and want to provide genuine, compassionate, and quality care. I welcome your feedback. If you receive a survey regarding your visit,  I greatly appreciate you taking time to fill this out. See you next time! ? ?Annitta Needs, PhD, ANP-BC ?[facility]  ? ?

## 2021-10-09 ENCOUNTER — Encounter: Payer: Self-pay | Admitting: Gastroenterology

## 2021-10-09 ENCOUNTER — Ambulatory Visit (INDEPENDENT_AMBULATORY_CARE_PROVIDER_SITE_OTHER): Payer: Medicare Other | Admitting: Gastroenterology

## 2021-10-09 VITALS — BP 130/72 | HR 75 | Temp 97.8°F | Ht 70.0 in | Wt 276.4 lb

## 2021-10-09 DIAGNOSIS — K641 Second degree hemorrhoids: Secondary | ICD-10-CM | POA: Diagnosis not present

## 2021-10-09 NOTE — Patient Instructions (Signed)
We will see you back in 6 months for routine office visit! ? ?Please call if you feel you need additional banding! ? ?I enjoyed seeing you again today! As you know, I value our relationship and want to provide genuine, compassionate, and quality care. I welcome your feedback. If you receive a survey regarding your visit,  I greatly appreciate you taking time to fill this out. See you next time! ? ?Annitta Needs, PhD, ANP-BC ?[facility]  ? ?

## 2021-10-09 NOTE — Progress Notes (Signed)
? ? ?  [place] BANDING PROCEDURE NOTE ? ?[name] is an 81 y.o. male presenting today for consideration of hemorrhoid banding. Last colonoscopy July 2020: tubular adenomas. Main complaint has been of fecal smearing. He has had left lateral and right posterior. Improvement in fecal smearing noted.  ? ?The patient presents with symptomatic grade 2 hemorrhoids, unresponsive to maximal medical therapy, requesting rubber band ligation of his hemorrhoidal disease. All risks, benefits, and alternative forms of therapy were described and informed consent was obtained. ? ?The decision was made to band the right anterior internal hemorrhoid, and the [place] was used to perform band ligation without complication. Digital anorectal examination was then performed to assure proper positioning of the band, and to adjust the banded tissue as required. The patient was discharged home without pain or other issues. Dietary and behavioral recommendations were given, along with follow-up instructions. The patient will return in 6 months for routine follow-up. He is to call if he feels he could benefit from further banding and would pursue neutral banding at that time.  ? ?No complications were encountered and the patient tolerated the procedure well.  ? ?Annitta Needs, PhD, ANP-BC ?[facility]  ? ?

## 2021-11-27 ENCOUNTER — Ambulatory Visit: Payer: PRIVATE HEALTH INSURANCE | Admitting: Cardiology

## 2021-12-04 NOTE — Progress Notes (Unsigned)
ID:  [name], DOB 01-18-1941, MRN [id number]  PCP:  Michell Heinrich, DO  Cardiologist:  Rex Kras, DO, [facility] (established care 12/05/2021) Former Cardiology Providers: ***  REASON FOR CONSULT: Heart failure  REQUESTING PHYSICIAN:  Michell Heinrich, [place] [address]  No chief complaint on file.   HPI  [name] is a 81 y.o. *** male who presents to the clinic for evaluation of eart failure at the request of Michell Heinrich, DO. His past medical history and cardiovascular risk factors include: ***  ***  History of  Denies prior history of coronary artery disease, myocardial infarction, congestive heart failure, deep venous thrombosis, pulmonary embolism, stroke, transient ischemic attack.  FUNCTIONAL STATUS: ***   ALLERGIES: Allergies  Allergen Reactions   Meloxicam Swelling    Face and tongue swelling      MEDICATION LIST PRIOR TO VISIT: No outpatient medications have been marked as taking for the 12/05/21 encounter (Appointment) with Rex Kras, DO.     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 years; discontinued in 1981    PAST SURGICAL HISTORY: Past Surgical History:  Procedure Laterality Date   ABDOMINAL HERNIA REPAIR  1990's   BIOPSY  04/13/2018   Procedure: BIOPSY;  Surgeon: Daneil Dolin, MD;  [facility];  Service: Endoscopy;;   BIOPSY   06/27/2019   Procedure: BIOPSY;  Surgeon: Daneil Dolin, MD;  [facility];  Service: Endoscopy;;  Distal Esophagus biopsy   CATARACT EXTRACTION W/ INTRAOCULAR LENS  IMPLANT, BILATERAL Bilateral 2012-2014   COLONOSCOPY  12/2005   normal   COLONOSCOPY  2004   tubulovillous adenoma of rectum    COLONOSCOPY  01/12/2011   Dr. Gala Romney- normal rectum   COLONOSCOPY N/A 07/09/2015   Procedure: COLONOSCOPY;  Surgeon: Daneil Dolin, MD;  [facility];  Service: Endoscopy;  Laterality: N/A;  730    COLONOSCOPY N/A 01/04/2019   Procedure: COLONOSCOPY;  Surgeon: Daneil Dolin, MD;  [facility];  Service: Endoscopy;  Laterality: N/A;  10:45am   ELBOW ARTHROSCOPY Left 2014   ESOPHAGEAL DILATION N/A 02/13/2015   Procedure: ESOPHAGEAL DILATION;  Surgeon: Daneil Dolin, MD;  [facility];  Service: Endoscopy;  Laterality: N/A;   ESOPHAGOGASTRODUODENOSCOPY  01/2006   geographic erosive RE, noncritical peptic stricture s/p dilation, antral erosion   ESOPHAGOGASTRODUODENOSCOPY  01/12/2011   Dr. Gala Romney- hiatal hernia, distal esophageal erosions, barretts esophagus   ESOPHAGOGASTRODUODENOSCOPY  01/27/2012   Dr.Rourk- short segment barretts esophagus, erosive reflux esophagitis. bx= barretts esophagus, negative for dysplasia   ESOPHAGOGASTRODUODENOSCOPY N/A 02/13/2015   Dr.Rourk- ulcerative/erosive reflux esophagitis, abnormal esophagitis c/w prior diagnosis of barretts esophagus, hiatal hernia. bx=barretts esophagus   ESOPHAGOGASTRODUODENOSCOPY N/A 04/13/2018   Procedure: ESOPHAGOGASTRODUODENOSCOPY (EGD);  Surgeon: Daneil Dolin, MD;  [facility];  Service: Endoscopy;  Laterality: N/A;  2:15pm   ESOPHAGOGASTRODUODENOSCOPY N/A 06/27/2019   Procedure: ESOPHAGOGASTRODUODENOSCOPY (EGD);  Surgeon: Daneil Dolin, MD;  [facility];  Service: Endoscopy;  Laterality: N/A;  11:15am   HEMORRHOID SURGERY N/A 07/12/2015   Procedure: SIMPLE HEMORRHOIDECTOMY;   Surgeon: Aviva Signs, MD;  Location: [facility];  Service: General;  Laterality: N/A;   HERNIA REPAIR     MALONEY DILATION  01/12/2011   Procedure: [name] DILATION;  Surgeon: Daneil Dolin, MD;  [facility];  Service: Endoscopy;  Laterality: N/A;  56 french   [place] N/A 04/13/2018   Procedure: MALONEY DILATION;  Surgeon: Daneil Dolin, MD;  [facility];  Service: Endoscopy;  Laterality: N/A;   MALONEY DILATION N/A 06/27/2019   Procedure: [name] DILATION;  Surgeon: Daneil Dolin, MD;  [facility];  Service: Endoscopy;  Laterality: N/A;   SQUAMOUS CELL CARCINOMA EXCISION     "cut off my hands; left arm; under right lip; right ear" (01/05/2014)   THYROIDECTOMY, PARTIAL Right 1969   lobe-nodule present   TOTAL KNEE ARTHROPLASTY Left 01/05/2014   Procedure: LEFT TOTAL KNEE ARTHROPLASTY;  Surgeon: Mcarthur Rossetti, MD;  Location: [facility];  Service: Orthopedics;  Laterality: Left;   TOTAL KNEE ARTHROPLASTY Right 05/04/2014   Procedure: RIGHT TOTAL KNEE ARTHROPLASTY;  Surgeon: Mcarthur Rossetti, MD;  Location: [facility];  Service: Orthopedics;  Laterality: Right;   UMBILICAL HERNIA REPAIR  2000   WISDOM TOOTH EXTRACTION  06/19/2020    FAMILY HISTORY: The patient family history includes Colon polyps in his brother; Lung cancer (age of onset: 23) in his father.  SOCIAL HISTORY:  The patient  reports that he quit smoking about 41 years ago. His smoking use included cigarettes. He started smoking about 65 years ago. He has a 20.00 pack-year smoking history. He has never used smokeless tobacco. He reports that he does not currently use alcohol. He reports that he does not use drugs.  REVIEW OF SYSTEMS: ROS  PHYSICAL EXAM:    10/09/2021   10:48 AM 09/18/2021    2:09 PM 09/04/2021    2:24 PM  Vitals with BMI  Height '5\' 10"'  '5\' 10"'  '5\' 10"'   Weight 276 lbs 6 oz 278 lbs 3 oz 276 lbs 13 oz  BMI 39.66 32.35 57.32  Systolic 202 542 706  Diastolic 72 78 90   Pulse 75 80 71    CONSTITUTIONAL: Well-developed and well-nourished. No acute distress.  SKIN: Skin is warm and dry. No rash noted. No cyanosis. No pallor. No jaundice HEAD: Normocephalic and atraumatic.  EYES: No scleral icterus MOUTH/THROAT: Moist oral membranes.  NECK: No JVD present. No thyromegaly noted. No carotid bruits  LYMPHATIC: No visible cervical adenopathy.  CHEST Normal respiratory effort. No intercostal retractions  LUNGS: ***Clear to auscultation bilaterally.  No stridor. No wheezes. No rales.  CARDIOVASCULAR: ***Regular rate and rhythm, positive S1-S2, no murmurs rubs or gallops appreciated. ABDOMINAL: *** No apparent ascites.  EXTREMITIES: No peripheral edema, warm to touch, ***DP and PT pulses HEMATOLOGIC: No significant bruising NEUROLOGIC: Oriented to person, place, and time. Nonfocal. Normal muscle tone.  PSYCHIATRIC: Normal mood and affect. Normal behavior. Cooperative  CARDIAC DATABASE: EKG: ***  Echocardiogram: No results found for this or any previous visit from the past 1095 days.    Stress Testing: No results found for this or any previous visit from the past  1095 days.   Heart Catheterization: None  LABORATORY DATA:    Latest Ref Rng & Units 07/10/2015   11:00 AM 05/05/2014    4:55 AM 04/27/2014    9:35 AM  CBC  WBC 4.0 - 10.5 K/uL 8.5   14.2   6.8    Hemoglobin 13.0 - 17.0 g/dL 14.3   11.5   14.0    Hematocrit 39.0 - 52.0 % 42.1   34.8   41.6    Platelets 150 - 400 K/uL 181   193   206         Latest Ref Rng & Units 11/27/2020   11:49 AM 07/10/2015   11:00 AM 05/05/2014    4:55 AM  CMP  Glucose 65 - 99 mg/dL 101   100   146    BUN 8 - 27 mg/dL '19   17   15    ' Creatinine 0.76 - 1.27 mg/dL 1.28   1.21   1.09    Sodium 134 - 144 mmol/L 144   141   138    Potassium 3.5 - 5.2 mmol/L 3.7   4.1   4.2    Chloride 96 - 106 mmol/L 103   105   101    CO2 20 - 29 mmol/L '24   28   25    ' Calcium 8.6 - 10.2 mg/dL 9.5   9.8   8.8    Total  Protein 6.0 - 8.5 g/dL 6.9      Total Bilirubin 0.0 - 1.2 mg/dL 0.6      Alkaline Phos 44 - 121 IU/L 82      AST 0 - 40 IU/L 24      ALT 0 - 44 IU/L 24        Lipid Panel     Component Value Date/Time   CHOL 127 11/27/2020 1149   TRIG 121 11/27/2020 1149   HDL 31 (L) 11/27/2020 1149   CHOLHDL 4.1 11/27/2020 1149   CHOLHDL 6.4 08/31/2012 1149   VLDL 52 (H) 08/31/2012 1149   LDLCALC 74 11/27/2020 1149   LABVLDL 22 11/27/2020 1149    No components found for: NTPROBNP No results for input(s): PROBNP in the last 8760 hours. No results for input(s): TSH in the last 8760 hours.  BMP No results for input(s): NA, K, CL, CO2, GLUCOSE, BUN, CREATININE, CALCIUM, GFRNONAA, GFRAA in the last 8760 hours.  HEMOGLOBIN A1C No results found for: HGBA1C, MPG  External Labs:  Date Collected: 11/11/2021 , information obtained by *** Potassium: 4.0 Creatinine 1.15 mg/dL. eGFR: 64 mL/min per 1.73 m Hemoglobin: 13.8 g/dL and hematocrit: 40.3 % Lipid profile: Total cholesterol 168 , triglycerides 190 , HDL 36 , LDL 101 AST: 18 , ALT: 19 , alkaline phosphatase: 72  Hemoglobin A1c: *** TSH: 3.93    IMPRESSION:  No diagnosis found.   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 *** male whose past medical history and cardiac risk factors include: ***   FINAL MEDICATION LIST END OF ENCOUNTER: No orders of the defined types were placed in this encounter.   There are no discontinued medications.   Current Outpatient Medications:    allopurinol (ZYLOPRIM) 100 MG tablet, Take 100 mg by mouth daily., Disp: , Rfl:    amLODipine (NORVASC) 10 MG tablet, Take 1 tablet (10 mg total) by mouth every morning., Disp: 90 tablet, Rfl: 3   atorvastatin (LIPITOR) 40 MG tablet, Take 40 mg by mouth at bedtime., Disp: ,  Rfl:    Cholecalciferol (VITAMIN D3) 125 MCG (5000 UT) CAPS, Take by mouth., Disp: , Rfl:    levothyroxine (SYNTHROID) 150 MCG tablet, Take 1 tablet (150 mcg total) by mouth daily before  breakfast., Disp: 90 tablet, Rfl: 3   omeprazole (PRILOSEC) 40 MG capsule, TAKE 1 CAPSULE BY MOUTH EVERY DAY, Disp: 90 capsule, Rfl: 3   tamsulosin (FLOMAX) 0.4 MG CAPS capsule, Take 0.4 mg by mouth at bedtime. , Disp: , Rfl:    valsartan (DIOVAN) 160 MG tablet, Take 160 mg by mouth daily., Disp: , Rfl:    vitamin C (ASCORBIC ACID) 500 MG tablet, Take 500 mg by mouth daily., Disp: , Rfl:   No orders of the defined types were placed in this encounter.   There are no Patient Instructions on file for this visit.   --Continue cardiac medications as reconciled in final medication list. --No follow-ups on file. or sooner if needed. --Continue follow-up with your primary care physician regarding the management of your other chronic comorbid conditions.  Patient's questions and concerns were addressed to his satisfaction. He voices understanding of the instructions provided during this encounter.   This note was created using a voice recognition software as a result there may be grammatical errors inadvertently enclosed that do not reflect the nature of this encounter. Every attempt is made to correct such errors.  Rex Kras, [place], [facility]  Pager: [phone] Office: [phone]

## 2021-12-05 ENCOUNTER — Encounter: Payer: Self-pay | Admitting: Cardiology

## 2021-12-05 ENCOUNTER — Other Ambulatory Visit: Payer: Self-pay | Admitting: Cardiology

## 2021-12-05 ENCOUNTER — Ambulatory Visit: Payer: Medicare Other | Admitting: Cardiology

## 2021-12-05 VITALS — BP 179/100 | HR 70 | Temp 98.0°F | Resp 16 | Ht 70.0 in | Wt 278.0 lb

## 2021-12-05 DIAGNOSIS — M7989 Other specified soft tissue disorders: Secondary | ICD-10-CM

## 2021-12-05 DIAGNOSIS — R0609 Other forms of dyspnea: Secondary | ICD-10-CM

## 2021-12-05 DIAGNOSIS — E782 Mixed hyperlipidemia: Secondary | ICD-10-CM

## 2021-12-05 DIAGNOSIS — I10 Essential (primary) hypertension: Secondary | ICD-10-CM

## 2021-12-05 DIAGNOSIS — E039 Hypothyroidism, unspecified: Secondary | ICD-10-CM

## 2021-12-05 MED ORDER — CHLORTHALIDONE 25 MG PO TABS: 25.0000 mg | ORAL_TABLET | Freq: Every morning | ORAL | 0 refills | Status: DC

## 2021-12-06 LAB — BASIC METABOLIC PANEL
BUN/Creatinine Ratio: 16 (ref 10–24)
BUN: 21 mg/dL (ref 8–27)
CO2: 23 mmol/L (ref 20–29)
Calcium: 9.5 mg/dL (ref 8.6–10.2)
Chloride: 106 mmol/L (ref 96–106)
Creatinine, Ser: 1.34 mg/dL — ABNORMAL HIGH (ref 0.76–1.27)
Glucose: 94 mg/dL (ref 70–99)
Potassium: 4 mmol/L (ref 3.5–5.2)
Sodium: 144 mmol/L (ref 134–144)
eGFR: 54 mL/min/{1.73_m2} — ABNORMAL LOW (ref >59)

## 2021-12-06 LAB — PRO B NATRIURETIC PEPTIDE: NT-Pro BNP: 148 pg/mL (ref 0–486)

## 2021-12-06 LAB — MAGNESIUM: Magnesium: 1.7 mg/dL (ref 1.6–2.3)

## 2021-12-09 NOTE — Progress Notes (Signed)
Called pt no answer, left a vm to return the call back

## 2021-12-09 NOTE — Progress Notes (Signed)
Tried calling pt again no answer left a vm

## 2021-12-11 NOTE — Progress Notes (Signed)
Called pt to inform him about his lab results.

## 2021-12-18 ENCOUNTER — Ambulatory Visit: Payer: Medicare Other

## 2021-12-18 DIAGNOSIS — R0609 Other forms of dyspnea: Secondary | ICD-10-CM

## 2022-01-16 ENCOUNTER — Ambulatory Visit: Payer: Medicare Other | Admitting: Cardiology

## 2022-01-16 ENCOUNTER — Encounter: Payer: Self-pay | Admitting: Cardiology

## 2022-01-16 VITALS — BP 163/77 | HR 57 | Temp 98.4°F | Resp 16 | Ht 70.0 in | Wt 275.8 lb

## 2022-01-16 DIAGNOSIS — I493 Ventricular premature depolarization: Secondary | ICD-10-CM

## 2022-01-16 DIAGNOSIS — E782 Mixed hyperlipidemia: Secondary | ICD-10-CM

## 2022-01-16 DIAGNOSIS — I5032 Chronic diastolic (congestive) heart failure: Secondary | ICD-10-CM

## 2022-01-16 DIAGNOSIS — E039 Hypothyroidism, unspecified: Secondary | ICD-10-CM

## 2022-01-16 DIAGNOSIS — I10 Essential (primary) hypertension: Secondary | ICD-10-CM

## 2022-01-16 DIAGNOSIS — R0683 Snoring: Secondary | ICD-10-CM

## 2022-01-16 MED ORDER — ENTRESTO 49-51 MG PO TABS: 1.0000 | ORAL_TABLET | Freq: Two times a day (BID) | ORAL | 0 refills | Status: DC

## 2022-01-16 MED ORDER — METOPROLOL SUCCINATE ER 25 MG PO TB24: 25.0000 mg | ORAL_TABLET | Freq: Every day | ORAL | 0 refills | Status: DC

## 2022-01-16 NOTE — Progress Notes (Signed)
ID:  [name], DOB 1940-10-03, MRN [id number]  PCP:  Michell Heinrich, DO  Cardiologist:  Rex Kras, DO, [facility] (established care 12/05/2021)  Date: 01/16/22 Last Office Visit: 12/05/2021  Chief Complaint  Patient presents with   Shortness of Breath   Results    Test results    HPI  [name] is a 81 y.o. Caucasian male whose past medical history and cardiovascular risk factors include: Chronic HFpEF, hypertension, hyperlipidemia, history of COVID-19 infection, BPH, obesity due to excess calories, hypothyroidism, advanced age  Patient was experiencing lower extremity swelling, shortness of breath with effort related activities, and given his comorbid conditions was referred to [place] for evaluation of heart failure.  Patient is shortness of breath likely multifactorial and at the last office visit started him on chlorthalidone which he is tolerated well.  He forgot to get labs after initiating medical therapy.  But his lower extremity swelling has improved significantly.  His home blood pressure log reviewed SBP now range between 120-140 mmHg in comparison to prior levels between 150-170 mmHg.  His blood pressures are elevated in the morning as compared to the evening hours.  Denies angina pectoris.  Review of systems are positive for lower extremity swelling.  Denies orthopnea and PND.  FUNCTIONAL STATUS: Still does his activities of daily living without any limitations.  But no structured exercise program or daily routine.  ALLERGIES: Allergies  Allergen Reactions   Meloxicam Swelling    Face and tongue swelling      MEDICATION LIST PRIOR TO VISIT: Current Meds  Medication Sig   allopurinol (ZYLOPRIM) 100 MG tablet Take 100 mg by mouth daily.   chlorthalidone (HYGROTON) 25 MG tablet Take 1 tablet (25 mg total) by mouth every morning.   Cholecalciferol (VITAMIN D3) 125 MCG (5000 UT) CAPS Take by mouth.   furosemide (LASIX) 20 MG tablet Take 20 mg by mouth daily as needed  for edema.   levothyroxine (SYNTHROID) 150 MCG tablet Take 1 tablet (150 mcg total) by mouth daily before breakfast.   lovastatin (MEVACOR) 20 MG tablet Take 1 tablet by mouth at bedtime.   metoprolol succinate (TOPROL XL) 25 MG 24 hr tablet Take 1 tablet (25 mg total) by mouth daily at 10 pm.   omeprazole (PRILOSEC) 40 MG capsule TAKE 1 CAPSULE BY MOUTH EVERY DAY   sacubitril-valsartan (ENTRESTO) 49-51 MG Take 1 tablet by mouth 2 (two) times daily.   tamsulosin (FLOMAX) 0.4 MG CAPS capsule Take 0.4 mg by mouth at bedtime.    vitamin C (ASCORBIC ACID) 500 MG tablet Take 500 mg by mouth daily.   [DISCONTINUED] valsartan (DIOVAN) 160 MG tablet Take 160 mg by mouth daily.     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 years; discontinued in 1981  PAST SURGICAL HISTORY: Past Surgical History:  Procedure Laterality Date   ABDOMINAL HERNIA REPAIR  1990's   BIOPSY  04/13/2018   Procedure: BIOPSY;  Surgeon: Daneil Dolin, MD;  [facility];  Service: Endoscopy;;   BIOPSY  06/27/2019   Procedure: BIOPSY;  Surgeon: Daneil Dolin, MD;  [facility];  Service: Endoscopy;;  Distal Esophagus biopsy   CATARACT EXTRACTION W/ INTRAOCULAR LENS  IMPLANT, BILATERAL Bilateral 2012-2014   COLONOSCOPY  12/2005   normal   COLONOSCOPY  2004   tubulovillous adenoma of rectum    COLONOSCOPY  01/12/2011   Dr.  Gala Romney- normal rectum   COLONOSCOPY N/A 07/09/2015   Procedure: COLONOSCOPY;  Surgeon: Daneil Dolin, MD;  [facility];  Service: Endoscopy;  Laterality: N/A;  730    COLONOSCOPY N/A 01/04/2019   Procedure: COLONOSCOPY;  Surgeon: Daneil Dolin, MD;  [facility];  Service: Endoscopy;  Laterality: N/A;  10:45am   ELBOW ARTHROSCOPY Left 2014   ESOPHAGEAL DILATION N/A 02/13/2015   Procedure: ESOPHAGEAL DILATION;  Surgeon: Daneil Dolin, MD;  [facility];  Service: Endoscopy;  Laterality: N/A;   ESOPHAGOGASTRODUODENOSCOPY  01/2006   geographic erosive RE, noncritical peptic stricture s/p dilation, antral erosion   ESOPHAGOGASTRODUODENOSCOPY  01/12/2011   Dr. Gala Romney- hiatal hernia, distal esophageal erosions, barretts esophagus   ESOPHAGOGASTRODUODENOSCOPY  01/27/2012   Dr.Rourk- short segment barretts esophagus, erosive reflux esophagitis. bx= barretts esophagus, negative for dysplasia   ESOPHAGOGASTRODUODENOSCOPY N/A 02/13/2015   Dr.Rourk- ulcerative/erosive reflux esophagitis, abnormal esophagitis c/w prior diagnosis of barretts esophagus, hiatal hernia. bx=barretts esophagus   ESOPHAGOGASTRODUODENOSCOPY N/A 04/13/2018   Procedure: ESOPHAGOGASTRODUODENOSCOPY (EGD);  Surgeon: Daneil Dolin, MD;  [facility];  Service: Endoscopy;  Laterality: N/A;  2:15pm   ESOPHAGOGASTRODUODENOSCOPY N/A 06/27/2019   Procedure: ESOPHAGOGASTRODUODENOSCOPY (EGD);  Surgeon: Daneil Dolin, MD;  [facility];  Service: Endoscopy;  Laterality: N/A;  11:15am   HEMORRHOID SURGERY N/A 07/12/2015   Procedure: SIMPLE HEMORRHOIDECTOMY;  Surgeon: Aviva Signs, MD;  Location: [facility];  Service: General;  Laterality: N/A;   HERNIA REPAIR     MALONEY DILATION  01/12/2011   Procedure: [name] DILATION;  Surgeon: Daneil Dolin, MD;  [facility];  Service: Endoscopy;  Laterality: N/A;  56 french   [place] N/A 04/13/2018   Procedure: MALONEY  DILATION;  Surgeon: Daneil Dolin, MD;  [facility];  Service: Endoscopy;  Laterality: N/A;   MALONEY DILATION N/A 06/27/2019   Procedure: [name] DILATION;  Surgeon: Daneil Dolin, MD;  [facility];  Service: Endoscopy;  Laterality: N/A;   SQUAMOUS CELL CARCINOMA EXCISION     "cut a piece of skin, off my hands; left arm; under right lip; right ear" (01/05/2014)   THYROIDECTOMY, PARTIAL Right 1969   lobe-nodule present   TOTAL KNEE ARTHROPLASTY Left 01/05/2014   Procedure: LEFT TOTAL KNEE ARTHROPLASTY;  Surgeon: Mcarthur Rossetti, MD;  Location: [place];  Service: Orthopedics;  Laterality: Left;   TOTAL KNEE ARTHROPLASTY Right 05/04/2014   Procedure: RIGHT TOTAL KNEE ARTHROPLASTY;  Surgeon: Mcarthur Rossetti, MD;  Location: [facility];  Service: Orthopedics;  Laterality: Right;   UMBILICAL HERNIA REPAIR  2000   WISDOM TOOTH EXTRACTION  06/19/2020    FAMILY HISTORY: The patient family history includes Colon polyps in his brother; Lung cancer (age of onset: 78) in his father.  SOCIAL HISTORY:  The patient  reports that  he quit smoking about 41 years ago. His smoking use included cigarettes. He started smoking about 65 years ago. He has a 20.00 pack-year smoking history. He has never used smokeless tobacco. He reports that he does not currently use alcohol. He reports that he does not use drugs.  REVIEW OF SYSTEMS: Review of Systems  Cardiovascular:  Positive for dyspnea on exertion and leg swelling. Negative for chest pain, near-syncope, orthopnea, palpitations, paroxysmal nocturnal dyspnea and syncope.  Respiratory:  Positive for shortness of breath and snoring.   Hematologic/Lymphatic: Negative for bleeding problem.    PHYSICAL EXAM:    01/16/2022    1:45 PM 12/05/2021    8:55 AM 12/05/2021    8:47 AM  Vitals with BMI  Height '5\' 10"'   '5\' 10"'   Weight 275 lbs 13 oz  278 lbs  BMI 37.90  24.09  Systolic 735 329 924  Diastolic 77 268 341  Pulse 57 70 65     CONSTITUTIONAL: Well-developed and well-nourished. No acute distress.  SKIN: Skin is warm and dry. No rash noted. No cyanosis. No pallor. No jaundice HEAD: Normocephalic and atraumatic.  EYES: No scleral icterus MOUTH/THROAT: Moist oral membranes.  NECK: No JVD present. No thyromegaly noted. No carotid bruits  CHEST Normal respiratory effort. No intercostal retractions  LUNGS: Clear to auscultation bilaterally.  No stridor. No wheezes. No rales.  CARDIOVASCULAR: Regular rate and rhythm, positive S1-S2, no murmurs rubs or gallops appreciated. ABDOMINAL: Obese, soft, nontender, nondistended, positive bowel sounds in all 4 quadrants, no apparent ascites.  EXTREMITIES: Trace bilateral peripheral edema, warm to touch, 2+ bilateral DP and PT pulses HEMATOLOGIC: No significant bruising NEUROLOGIC: Oriented to person, place, and time. Nonfocal. Normal muscle tone.  PSYCHIATRIC: Normal mood and affect. Normal behavior. Cooperative  CARDIAC DATABASE: EKG: December 05, 2021: Normal sinus rhythm, 74 bpm, frequent PVCs.  Echocardiogram: 09/25/2015: Technically difficult study, LVEF 60 to 96%, grade 1 diastolic impairment, mild MR, moderate LAE, liver cyst 4 x 5.5 cm.  12/18/2021: Left ventricle cavity is normal in size. Mild concentric hypertrophy of the left ventricle. Normal global wall motion. Normal LV systolic function with EF 62%. Doppler evidence of grade I (impaired) diastolic dysfunction, normal LAP.  The aortic root is borderline dilated at 3.9 cm. Left atrial cavity is mildly dilated. Aneurysmal interatrial septum without 2D or color Doppler evidence of shunting. Mild to moderate mitral regurgitation. Mild tricuspid regurgitation. Mild pulmonic regurgitation. IVC is not seen.  Incidental finding of large echolucent structure in lives, possible cyst. Recommend clinical correlation.   Stress Testing: No results found for this or any previous visit from the past 1095 days.   Heart  Catheterization: None  LABORATORY DATA:    Latest Ref Rng & Units 07/10/2015   11:00 AM 05/05/2014    4:55 AM 04/27/2014    9:35 AM  CBC  WBC 4.0 - 10.5 K/uL 8.5  14.2  6.8   Hemoglobin 13.0 - 17.0 g/dL 14.3  11.5  14.0   Hematocrit 39.0 - 52.0 % 42.1  34.8  41.6   Platelets 150 - 400 K/uL 181  193  206        Latest Ref Rng & Units 12/05/2021   10:41 AM 11/27/2020   11:49 AM 07/10/2015   11:00 AM  CMP  Glucose 70 - 99 mg/dL 94  101  100   BUN 8 - 27 mg/dL '21  19  17   ' Creatinine 0.76 - 1.27 mg/dL 1.34  1.28  1.21  Sodium 134 - 144 mmol/L 144  144  141   Potassium 3.5 - 5.2 mmol/L 4.0  3.7  4.1   Chloride 96 - 106 mmol/L 106  103  105   CO2 20 - 29 mmol/L '23  24  28   ' Calcium 8.6 - 10.2 mg/dL 9.5  9.5  9.8   Total Protein 6.0 - 8.5 g/dL  6.9    Total Bilirubin 0.0 - 1.2 mg/dL  0.6    Alkaline Phos 44 - 121 IU/L  82    AST 0 - 40 IU/L  24    ALT 0 - 44 IU/L  24      Lipid Panel  Lab Results  Component Value Date   CHOL 127 11/27/2020   HDL 31 (L) 11/27/2020   LDLCALC 74 11/27/2020   TRIG 121 11/27/2020   CHOLHDL 4.1 11/27/2020   No components found for: "NTPROBNP" Recent Labs    12/05/21 1041  PROBNP 148   No results for input(s): "TSH" in the last 8760 hours.  BMP Recent Labs    12/05/21 1041  NA 144  K 4.0  CL 106  CO2 23  GLUCOSE 94  BUN 21  CREATININE 1.34*  CALCIUM 9.5    HEMOGLOBIN A1C No results found for: "HGBA1C", "MPG"  External Labs:  Date Collected: 11/11/2021 , information obtained by referring physician BNP 126 Potassium: 4.0 Creatinine 1.15 mg/dL. eGFR: 64 mL/min per 1.73 m Hemoglobin: 13.8 g/dL and hematocrit: 40.3 % Lipid profile: Total cholesterol 168 , triglycerides 190 , HDL 36 , LDL 101 non-HDL 132 AST: 18 , ALT: 19 , alkaline phosphatase: 72  TSH: 3.93    IMPRESSION:    ICD-10-CM   1. Chronic heart failure with preserved ejection fraction (HFpEF) (HCC)  I50.32 sacubitril-valsartan (ENTRESTO) 49-51 MG    metoprolol  succinate (TOPROL XL) 25 MG 24 hr tablet    Pro b natriuretic peptide (BNP)    Basic metabolic panel    Magnesium    Ambulatory referral to Sleep Studies    2. PVC's (premature ventricular contractions)  I49.3     3. Benign hypertension  I10     4. Mixed hyperlipidemia  E78.2     5. Hypothyroidism, unspecified type  E03.9     6. Class 2 severe obesity due to excess calories with serious comorbidity and body mass index (BMI) of 39.0 to 39.9 in adult (HCC)  E66.01    Z68.39     7. Snoring  R06.83 Ambulatory referral to Sleep Studies       RECOMMENDATIONS: BRITIAN JENTZ is a 81 y.o. Caucasian male whose past medical history and cardiac risk factors include: Hypertension, hyperlipidemia, history of COVID-19 infection, BPH, obesity due to excess calories, hypothyroidism, advanced age.   Chronic heart failure with preserved ejection fraction (HFpEF) (HCC) Stage B, NYHA class II/III. Echocardiogram notes preserved LVEF, grade 1 diastolic impairment, mild to moderate valvular heart disease. Discontinue valsartan. Start Entresto 49/51 mg p.o. twice daily. Labs in 1 week to evaluate kidney function and electrolytes. Continue chlorthalidone daily and Lasix on as needed basis. Educated on the importance of improving his modifiable cardiovascular risk factors such as weight loss, glycemic control, lipid/triglyceride management. Once the blood pressures are better controlled would like to proceed with stress test to evaluate for underlying ischemia. We will refer to sleep medicine for evaluation of sleep apnea.  PVC's (premature ventricular contractions) Noted on surface ECG as well as diagnostic studies noted above. Start Toprol-XL 25  mg p.o. every afternoon. Monitor for now  Benign hypertension Office blood pressure not well controlled. Home blood pressures are improving but not at goal. Medication changes as discussed above. Reemphasized importance of a low-salt diet.  Mixed  hyperlipidemia Currently on lovastatin.   He denies myalgia or other side effects. Most recent lipids dated May 2023, independently reviewed as noted above. Most recent LDL 101 mg/dL and triglycerides 190 mg/dL.  Patient used to be on atorvastatin if no contraindications recommend transitioning back to atorvastatin. Currently managed by primary care provider.  Class 2 severe obesity due to excess calories with serious comorbidity and body mass index (BMI) of 39.0 to 39.9 in adult [facility]) Body mass index is 39.57 kg/m. I reviewed with the patient the importance of diet, regular physical activity/exercise, weight loss.   Patient is educated on increasing physical activity gradually as tolerated.  With the goal of moderate intensity exercise for 30 minutes a day 5 days a week.  Snoring Given his excessive snoring, concerns for apneic episodes at night, higher blood pressures in the morning hours as opposed to afternoon/evening hours, body habitus, oral anatomy, HFpEF and other comorbid conditions recommend that he be evaluated for sleep apnea. We will refer to Dr. Nehemiah Settle. Both patient and wife are agreeable with the plan of care.  FINAL MEDICATION LIST END OF ENCOUNTER: Meds ordered this encounter  Medications   sacubitril-valsartan (ENTRESTO) 49-51 MG    Sig: Take 1 tablet by mouth 2 (two) times daily.    Dispense:  60 tablet    Refill:  0   metoprolol succinate (TOPROL XL) 25 MG 24 hr tablet    Sig: Take 1 tablet (25 mg total) by mouth daily at 10 pm.    Dispense:  90 tablet    Refill:  0    Medications Discontinued During This Encounter  Medication Reason   atorvastatin (LIPITOR) 40 MG tablet    valsartan (DIOVAN) 160 MG tablet Change in therapy     Current Outpatient Medications:    allopurinol (ZYLOPRIM) 100 MG tablet, Take 100 mg by mouth daily., Disp: , Rfl:    chlorthalidone (HYGROTON) 25 MG tablet, Take 1 tablet (25 mg total) by mouth every morning., Disp: 90 tablet,  Rfl: 0   Cholecalciferol (VITAMIN D3) 125 MCG (5000 UT) CAPS, Take by mouth., Disp: , Rfl:    furosemide (LASIX) 20 MG tablet, Take 20 mg by mouth daily as needed for edema., Disp: , Rfl:    levothyroxine (SYNTHROID) 150 MCG tablet, Take 1 tablet (150 mcg total) by mouth daily before breakfast., Disp: 90 tablet, Rfl: 3   lovastatin (MEVACOR) 20 MG tablet, Take 1 tablet by mouth at bedtime., Disp: , Rfl:    metoprolol succinate (TOPROL XL) 25 MG 24 hr tablet, Take 1 tablet (25 mg total) by mouth daily at 10 pm., Disp: 90 tablet, Rfl: 0   omeprazole (PRILOSEC) 40 MG capsule, TAKE 1 CAPSULE BY MOUTH EVERY DAY, Disp: 90 capsule, Rfl: 3   sacubitril-valsartan (ENTRESTO) 49-51 MG, Take 1 tablet by mouth 2 (two) times daily., Disp: 60 tablet, Rfl: 0   tamsulosin (FLOMAX) 0.4 MG CAPS capsule, Take 0.4 mg by mouth at bedtime. , Disp: , Rfl:    vitamin C (ASCORBIC ACID) 500 MG tablet, Take 500 mg by mouth daily., Disp: , Rfl:   Orders Placed This Encounter  Procedures   Pro b natriuretic peptide (BNP)   Basic metabolic panel   Magnesium   Ambulatory referral to Sleep  Studies    There are no Patient Instructions on file for this visit.   --Continue cardiac medications as reconciled in final medication list. --Return in about 4 weeks (around 02/13/2022) for Follow up, heart failure management.. or sooner if needed. --Continue follow-up with your primary care physician regarding the management of your other chronic comorbid conditions.  Patient's questions and concerns were addressed to his satisfaction. He voices understanding of the instructions provided during this encounter.   This note was created using a voice recognition software as a result there may be grammatical errors inadvertently enclosed that do not reflect the nature of this encounter. Every attempt is made to correct such errors.  Rex Kras, [place], [facility]  Pager: [phone] Office: [phone]

## 2022-01-17 ENCOUNTER — Other Ambulatory Visit: Payer: Self-pay | Admitting: Nurse Practitioner

## 2022-01-26 ENCOUNTER — Other Ambulatory Visit: Payer: Self-pay | Admitting: Cardiology

## 2022-01-30 LAB — SPECIMEN STATUS REPORT

## 2022-01-30 LAB — BASIC METABOLIC PANEL
BUN/Creatinine Ratio: 16 (ref 10–24)
BUN: 23 mg/dL (ref 8–27)
CO2: 25 mmol/L (ref 20–29)
Calcium: 9.9 mg/dL (ref 8.6–10.2)
Chloride: 102 mmol/L (ref 96–106)
Creatinine, Ser: 1.42 mg/dL — ABNORMAL HIGH (ref 0.76–1.27)
Glucose: 94 mg/dL (ref 70–99)
Potassium: 4.4 mmol/L (ref 3.5–5.2)
Sodium: 144 mmol/L (ref 134–144)
eGFR: 50 mL/min/{1.73_m2} — ABNORMAL LOW (ref >59)

## 2022-01-30 LAB — MAGNESIUM: Magnesium: 1.8 mg/dL (ref 1.6–2.3)

## 2022-01-30 LAB — BRAIN NATRIURETIC PEPTIDE: BNP: 338.8 pg/mL — ABNORMAL HIGH (ref 0.0–100.0)

## 2022-02-01 ENCOUNTER — Other Ambulatory Visit: Payer: Self-pay | Admitting: Cardiology

## 2022-02-01 DIAGNOSIS — R0609 Other forms of dyspnea: Secondary | ICD-10-CM

## 2022-02-01 DIAGNOSIS — I5032 Chronic diastolic (congestive) heart failure: Secondary | ICD-10-CM

## 2022-02-01 DIAGNOSIS — I493 Ventricular premature depolarization: Secondary | ICD-10-CM

## 2022-02-01 NOTE — Progress Notes (Signed)
Spoke to patient He is doing well.  Asked to increase fluid by 2 glass per day.  Home BP around 140mHG.  Shortness of breath and LE swelling better.  Uses lasix as prn.   Recommended:  Stress test since his BP is better now.  He will call the office Monday [facility]arding [facility] assistance  Still awaiting sleep medicine evaluation.  Follow up after stress test - unable to exercise will need pharmacologic MPI.   SRex Kras DNevada FIndiana University Health Bloomington HospitalPager: [phone]: [phone]

## 2022-02-03 ENCOUNTER — Other Ambulatory Visit: Payer: Self-pay

## 2022-02-03 DIAGNOSIS — I5032 Chronic diastolic (congestive) heart failure: Secondary | ICD-10-CM

## 2022-02-03 MED ORDER — ENTRESTO 49-51 MG PO TABS: 1.0000 | ORAL_TABLET | Freq: Two times a day (BID) | ORAL | 3 refills | Status: DC

## 2022-02-13 ENCOUNTER — Encounter: Payer: Self-pay | Admitting: Cardiology

## 2022-02-13 ENCOUNTER — Ambulatory Visit: Payer: Medicare Other | Admitting: Cardiology

## 2022-02-13 VITALS — BP 151/77 | HR 56 | Temp 98.7°F | Resp 16 | Ht 70.0 in | Wt 269.0 lb

## 2022-02-13 DIAGNOSIS — R0683 Snoring: Secondary | ICD-10-CM

## 2022-02-13 DIAGNOSIS — I493 Ventricular premature depolarization: Secondary | ICD-10-CM

## 2022-02-13 DIAGNOSIS — I10 Essential (primary) hypertension: Secondary | ICD-10-CM

## 2022-02-13 DIAGNOSIS — I5032 Chronic diastolic (congestive) heart failure: Secondary | ICD-10-CM

## 2022-02-13 DIAGNOSIS — E782 Mixed hyperlipidemia: Secondary | ICD-10-CM

## 2022-02-13 DIAGNOSIS — R0609 Other forms of dyspnea: Secondary | ICD-10-CM

## 2022-02-13 MED ORDER — ATORVASTATIN CALCIUM 20 MG PO TABS: 20.0000 mg | ORAL_TABLET | Freq: Every day | ORAL | 0 refills | Status: DC

## 2022-02-13 NOTE — Progress Notes (Signed)
ID:  [name], DOB [birth year], MRN [id number]  PCP:  Michell Heinrich, DO  Cardiologist:  Rex Kras, DO, [facility] (established care 12/05/2021)  Date: 02/13/22 Last Office Visit: 01/16/2022  Chief Complaint  Patient presents with  . heart failure management  . Follow-up    HPI  [name] is a 81 y.o. Caucasian male whose past medical history and cardiovascular risk factors include: Chronic HFpEF, hypertension, hyperlipidemia, history of COVID-19 infection, BPH, obesity due to excess calories, hypothyroidism, advanced age  Patient was experiencing lower extremity swelling, shortness of breath with effort related activities, and given his comorbid conditions was referred to [place] for evaluation of heart failure.  Patient is shortness of breath likely multifactorial and at the last office visit started him on chlorthalidone which he is tolerated well.  He forgot to get labs after initiating medical therapy.  But his lower extremity swelling has improved significantly.  His home blood pressure log reviewed SBP now range between 120-140 mmHg in comparison to prior levels between 150-170 mmHg.  His blood pressures are elevated in the morning as compared to the evening hours.  Denies angina pectoris.  Review of systems are positive for lower extremity swelling.  Denies orthopnea and PND.  90% better No CP  150-170 120-140 <135  6# less  FUNCTIONAL STATUS: Still does his activities of daily living without any limitations.  But no structured exercise program or daily routine.  ALLERGIES: Allergies  Allergen Reactions  . Meloxicam Swelling    Face and tongue swelling      MEDICATION LIST PRIOR TO VISIT: Current Meds  Medication Sig  . allopurinol (ZYLOPRIM) 100 MG tablet Take 100 mg by mouth daily.  . chlorthalidone (HYGROTON) 25 MG tablet Take 1 tablet (25 mg total) by mouth every morning.  . Cholecalciferol (VITAMIN D3) 125 MCG (5000 UT) CAPS Take by mouth.  . furosemide  (LASIX) 20 MG tablet Take 20 mg by mouth daily as needed for edema.  Marland Kitchen levothyroxine (SYNTHROID) 150 MCG tablet Take 1 tablet (150 mcg total) by mouth daily before breakfast.  . lovastatin (MEVACOR) 20 MG tablet Take 1 tablet by mouth at bedtime.  . metoprolol succinate (TOPROL XL) 25 MG 24 hr tablet Take 1 tablet (25 mg total) by mouth daily at 10 pm.  . omeprazole (PRILOSEC) 40 MG capsule TAKE 1 CAPSULE BY MOUTH EVERY DAY  . sacubitril-valsartan (ENTRESTO) 49-51 MG Take 1 tablet by mouth 2 (two) times daily.  . tamsulosin (FLOMAX) 0.4 MG CAPS capsule Take 0.4 mg by mouth at bedtime.   . vitamin C (ASCORBIC ACID) 500 MG tablet Take 500 mg by mouth daily.     PAST MEDICAL HISTORY: Past Medical History:  Diagnosis Date  . Arthritis    "fingers; knees; toes" (01/05/2014)  . Barrett's esophagus   . BPH (benign prostatic hyperplasia)   . Bradycardia 2009   Sinus rhythm in the 40s without symptoms;  Marland Kitchen GERD (gastroesophageal reflux disease)    ulcerative/erosive  . Gout   . Hemorrhoids   . Hiatal hernia   . History of skin cancer   . Hx of adenomatous colonic polyps   . Hyperlipidemia   . Hypertension   . Hypothyroidism    Following partial thyroidectomy  . Squamous carcinoma    skin cancer benign arms and right ear and rightside lip  . Tobacco abuse, in remission    10-20 pack years; discontinued in 1981    PAST SURGICAL HISTORY: Past Surgical History:  Procedure Laterality Date  . ABDOMINAL HERNIA REPAIR  1990's  . BIOPSY  04/13/2018   Procedure: BIOPSY;  Surgeon: Daneil Dolin, MD;  [facility];  Service: Endoscopy;;  . BIOPSY  06/27/2019   Procedure: BIOPSY;  Surgeon: Daneil Dolin, MD;  [facility];  Service: Endoscopy;;  Distal Esophagus biopsy  . CATARACT EXTRACTION W/ INTRAOCULAR LENS  IMPLANT, BILATERAL Bilateral 2012-2014  . COLONOSCOPY  12/2005   normal  . COLONOSCOPY  2004   tubulovillous adenoma of rectum   . COLONOSCOPY  01/12/2011    Dr. Gala Romney- normal rectum  . COLONOSCOPY N/A 07/09/2015   Procedure: COLONOSCOPY;  Surgeon: Daneil Dolin, MD;  [facility];  Service: Endoscopy;  Laterality: N/A;  730   . COLONOSCOPY N/A 01/04/2019   Procedure: COLONOSCOPY;  Surgeon: Daneil Dolin, MD;  [facility];  Service: Endoscopy;  Laterality: N/A;  10:45am  . ELBOW ARTHROSCOPY Left 2014  . ESOPHAGEAL DILATION N/A 02/13/2015   Procedure: ESOPHAGEAL DILATION;  Surgeon: Daneil Dolin, MD;  [facility];  Service: Endoscopy;  Laterality: N/A;  . ESOPHAGOGASTRODUODENOSCOPY  01/2006   geographic erosive RE, noncritical peptic stricture s/p dilation, antral erosion  . ESOPHAGOGASTRODUODENOSCOPY  01/12/2011   Dr. Gala Romney- hiatal hernia, distal esophageal erosions, barretts esophagus  . ESOPHAGOGASTRODUODENOSCOPY  01/27/2012   Dr.Rourk- short segment barretts esophagus, erosive reflux esophagitis. bx= barretts esophagus, negative for dysplasia  . ESOPHAGOGASTRODUODENOSCOPY N/A 02/13/2015   Dr.Rourk- ulcerative/erosive reflux esophagitis, abnormal esophagitis c/w prior diagnosis of barretts esophagus, hiatal hernia. bx=barretts esophagus  . ESOPHAGOGASTRODUODENOSCOPY N/A 04/13/2018   Procedure: ESOPHAGOGASTRODUODENOSCOPY (EGD);  Surgeon: Daneil Dolin, MD;  [facility];  Service: Endoscopy;  Laterality: N/A;  2:15pm  . ESOPHAGOGASTRODUODENOSCOPY N/A 06/27/2019   Procedure: ESOPHAGOGASTRODUODENOSCOPY (EGD);  Surgeon: Daneil Dolin, MD;  [facility];  Service: Endoscopy;  Laterality: N/A;  11:15am  . HEMORRHOID SURGERY N/A 07/12/2015   Procedure: SIMPLE HEMORRHOIDECTOMY;  Surgeon: Aviva Signs, MD;  Location: [facility];  Service: General;  Laterality: N/A;  . Garza  01/12/2011   Procedure: [name] DILATION;  Surgeon: Daneil Dolin, MD;  [facility];  Service: Endoscopy;  Laterality: N/A;  56 french  . MALONEY DILATION N/A 04/13/2018    Procedure: [name] DILATION;  Surgeon: Daneil Dolin, MD;  [facility];  Service: Endoscopy;  Laterality: N/A;  [name] DILATION N/A 06/27/2019   Procedure: [name] DILATION;  Surgeon: Daneil Dolin, MD;  [facility];  Service: Endoscopy;  Laterality: N/A;  . SQUAMOUS CELL CARCINOMA EXCISION     "cut a piece of skin, off my hands; left arm; under right lip; right ear" (01/05/2014)  . THYROIDECTOMY, PARTIAL Right 1969   lobe-nodule present  . TOTAL KNEE ARTHROPLASTY Left 01/05/2014   Procedure: LEFT TOTAL KNEE ARTHROPLASTY;  Surgeon: Mcarthur Rossetti, MD;  Location: [facility];  Service: Orthopedics;  Laterality: Left;  . TOTAL KNEE ARTHROPLASTY Right 05/04/2014   Procedure: RIGHT TOTAL KNEE ARTHROPLASTY;  Surgeon: Mcarthur Rossetti, MD;  Location: [facility];  Service: Orthopedics;  Laterality: Right;  . UMBILICAL HERNIA REPAIR  2000  . WISDOM TOOTH EXTRACTION  06/19/2020    FAMILY HISTORY: The patient family history includes Colon polyps in his brother; Lung cancer (age of onset: 37) in his father.  SOCIAL HISTORY:  The patient  reports that he quit smoking about 41 years ago.  His smoking use included cigarettes. He started smoking about 65 years ago. He has a 20.00 pack-year smoking history. He has never used smokeless tobacco. He reports that he does not currently use alcohol. He reports that he does not use drugs.  REVIEW OF SYSTEMS: Review of Systems  Cardiovascular:  Positive for dyspnea on exertion (improving). Negative for chest pain, leg swelling, near-syncope, orthopnea, palpitations, paroxysmal nocturnal dyspnea and syncope.  Respiratory:  Positive for shortness of breath (improving) and snoring.   Hematologic/Lymphatic: Negative for bleeding problem.    PHYSICAL EXAM:    02/13/2022    2:49 PM 01/16/2022    1:45 PM 12/05/2021    8:55 AM  Vitals with BMI  Height '5\' 10"'  '5\' 10"'    Weight 269 lbs 275 lbs 13 oz   BMI 37.6 28.31   Systolic 517 616  073  Diastolic 77 77 710  Pulse 56 57 70    CONSTITUTIONAL: Well-developed and well-nourished. No acute distress.  SKIN: Skin is warm and dry. No rash noted. No cyanosis. No pallor. No jaundice HEAD: Normocephalic and atraumatic.  EYES: No scleral icterus MOUTH/THROAT: Moist oral membranes.  NECK: No JVD present. No thyromegaly noted. No carotid bruits  CHEST Normal respiratory effort. No intercostal retractions  LUNGS: Clear to auscultation bilaterally.  No stridor. No wheezes. No rales.  CARDIOVASCULAR: Regular rate and rhythm, positive S1-S2, no murmurs rubs or gallops appreciated. ABDOMINAL: Obese, soft, nontender, nondistended, positive bowel sounds in all 4 quadrants, no apparent ascites.  EXTREMITIES: Trace bilateral peripheral edema, warm to touch, 2+ bilateral DP and PT pulses HEMATOLOGIC: No significant bruising NEUROLOGIC: Oriented to person, place, and time. Nonfocal. Normal muscle tone.  PSYCHIATRIC: Normal mood and affect. Normal behavior. Cooperative  CARDIAC DATABASE: EKG: December 05, 2021: Normal sinus rhythm, 74 bpm, frequent PVCs.  Echocardiogram: 09/25/2015: Technically difficult study, LVEF 60 to 62%, grade 1 diastolic impairment, mild MR, moderate LAE, liver cyst 4 x 5.5 cm.  12/18/2021: Left ventricle cavity is normal in size. Mild concentric hypertrophy of the left ventricle. Normal global wall motion. Normal LV systolic function with EF 62%. Doppler evidence of grade I (impaired) diastolic dysfunction, normal LAP.  The aortic root is borderline dilated at 3.9 cm. Left atrial cavity is mildly dilated. Aneurysmal interatrial septum without 2D or color Doppler evidence of shunting. Mild to moderate mitral regurgitation. Mild tricuspid regurgitation. Mild pulmonic regurgitation. IVC is not seen.  Incidental finding of large echolucent structure in lives, possible cyst. Recommend clinical correlation.   Stress Testing: No results found for this or any previous  visit from the past 1095 days.   Heart Catheterization: None  LABORATORY DATA:    Latest Ref Rng & Units 07/10/2015   11:00 AM 05/05/2014    4:55 AM 04/27/2014    9:35 AM  CBC  WBC 4.0 - 10.5 K/uL 8.5  14.2  6.8   Hemoglobin 13.0 - 17.0 g/dL 14.3  11.5  14.0   Hematocrit 39.0 - 52.0 % 42.1  34.8  41.6   Platelets 150 - 400 K/uL 181  193  206        Latest Ref Rng & Units 01/26/2022   10:03 AM 12/05/2021   10:41 AM 11/27/2020   11:49 AM  CMP  Glucose 70 - 99 mg/dL 94  94  101   BUN 8 - 27 mg/dL '23  21  19   ' Creatinine 0.76 - 1.27 mg/dL 1.42  1.34  1.28   Sodium 134 - 144 mmol/L 144  144  144   Potassium 3.5 - 5.2 mmol/L 4.4  4.0  3.7   Chloride 96 - 106 mmol/L 102  106  103   CO2 20 - 29 mmol/L '25  23  24   ' Calcium 8.6 - 10.2 mg/dL 9.9  9.5  9.5   Total Protein 6.0 - 8.5 g/dL   6.9   Total Bilirubin 0.0 - 1.2 mg/dL   0.6   Alkaline Phos 44 - 121 IU/L   82   AST 0 - 40 IU/L   24   ALT 0 - 44 IU/L   24     Lipid Panel  Lab Results  Component Value Date   CHOL 127 11/27/2020   HDL 31 (L) 11/27/2020   LDLCALC 74 11/27/2020   TRIG 121 11/27/2020   CHOLHDL 4.1 11/27/2020   No components found for: "NTPROBNP" Recent Labs    12/05/21 1041  PROBNP 148   No results for input(s): "TSH" in the last 8760 hours.  BMP Recent Labs    12/05/21 1041 01/26/22 1003  NA 144 144  K 4.0 4.4  CL 106 102  CO2 23 25  GLUCOSE 94 94  BUN 21 23  CREATININE 1.34* 1.42*  CALCIUM 9.5 9.9    HEMOGLOBIN A1C No results found for: "HGBA1C", "MPG"  External Labs:  Date Collected: 11/11/2021 , information obtained by referring physician BNP 126 Potassium: 4.0 Creatinine 1.15 mg/dL. eGFR: 64 mL/min per 1.73 m Hemoglobin: 13.8 g/dL and hematocrit: 40.3 % Lipid profile: Total cholesterol 168 , triglycerides 190 , HDL 36 , LDL 101 non-HDL 132 AST: 18 , ALT: 19 , alkaline phosphatase: 72  TSH: 3.93    IMPRESSION:  No diagnosis found.    RECOMMENDATIONS: [name] is a  81 y.o. Caucasian male whose past medical history and cardiac risk factors include: Hypertension, hyperlipidemia, history of COVID-19 infection, BPH, obesity due to excess calories, hypothyroidism, advanced age.   Chronic heart failure with preserved ejection fraction (HFpEF) (HCC) Stage B, NYHA class II/III. Echocardiogram notes preserved LVEF, grade 1 diastolic impairment, mild to moderate valvular heart disease. Discontinue valsartan. Start Entresto 49/51 mg p.o. twice daily. Labs in 1 week to evaluate kidney function and electrolytes. Continue chlorthalidone daily and Lasix on as needed basis. Educated on the importance of improving his modifiable cardiovascular risk factors such as weight loss, glycemic control, lipid/triglyceride management. Once the blood pressures are better controlled would like to proceed with stress test to evaluate for underlying ischemia. We will refer to sleep medicine for evaluation of sleep apnea.  PVC's (premature ventricular contractions) Noted on surface ECG as well as diagnostic studies noted above. Start Toprol-XL 25 mg p.o. every afternoon. Monitor for now  Benign hypertension Office blood pressure not well controlled. Home blood pressures are improving but not at goal. Medication changes as discussed above. Reemphasized importance of a low-salt diet.  Mixed hyperlipidemia Currently on lovastatin.   He denies myalgia or other side effects. Most recent lipids dated May 2023, independently reviewed as noted above. Most recent LDL 101 mg/dL and triglycerides 190 mg/dL.  Patient used to be on atorvastatin if no contraindications recommend transitioning back to atorvastatin. Currently managed by primary care provider.  Class 2 severe obesity due to excess calories with serious comorbidity and body mass index (BMI) of 39.0 to 39.9 in adult [facility]) Body mass index is 38.6 kg/m. I reviewed with the patient the importance of diet, regular physical  activity/exercise, weight loss.   Patient is  educated on increasing physical activity gradually as tolerated.  With the goal of moderate intensity exercise for 30 minutes a day 5 days a week.  Snoring Given his excessive snoring, concerns for apneic episodes at night, higher blood pressures in the morning hours as opposed to afternoon/evening hours, body habitus, oral anatomy, HFpEF and other comorbid conditions recommend that he be evaluated for sleep apnea. We will refer to Dr. Nehemiah Settle. Both patient and wife are agreeable with the plan of care.  FINAL MEDICATION LIST END OF ENCOUNTER: No orders of the defined types were placed in this encounter.   There are no discontinued medications.    Current Outpatient Medications:  .  allopurinol (ZYLOPRIM) 100 MG tablet, Take 100 mg by mouth daily., Disp: , Rfl:  .  chlorthalidone (HYGROTON) 25 MG tablet, Take 1 tablet (25 mg total) by mouth every morning., Disp: 90 tablet, Rfl: 0 .  Cholecalciferol (VITAMIN D3) 125 MCG (5000 UT) CAPS, Take by mouth., Disp: , Rfl:  .  furosemide (LASIX) 20 MG tablet, Take 20 mg by mouth daily as needed for edema., Disp: , Rfl:  .  levothyroxine (SYNTHROID) 150 MCG tablet, Take 1 tablet (150 mcg total) by mouth daily before breakfast., Disp: 90 tablet, Rfl: 3 .  lovastatin (MEVACOR) 20 MG tablet, Take 1 tablet by mouth at bedtime., Disp: , Rfl:  .  metoprolol succinate (TOPROL XL) 25 MG 24 hr tablet, Take 1 tablet (25 mg total) by mouth daily at 10 pm., Disp: 90 tablet, Rfl: 0 .  omeprazole (PRILOSEC) 40 MG capsule, TAKE 1 CAPSULE BY MOUTH EVERY DAY, Disp: 90 capsule, Rfl: 3 .  sacubitril-valsartan (ENTRESTO) 49-51 MG, Take 1 tablet by mouth 2 (two) times daily., Disp: 180 tablet, Rfl: 3 .  tamsulosin (FLOMAX) 0.4 MG CAPS capsule, Take 0.4 mg by mouth at bedtime. , Disp: , Rfl:  .  vitamin C (ASCORBIC ACID) 500 MG tablet, Take 500 mg by mouth daily., Disp: , Rfl:   No orders of the defined types were placed  in this encounter.   There are no Patient Instructions on file for this visit.   --Continue cardiac medications as reconciled in final medication list. --No follow-ups on file. or sooner if needed. --Continue follow-up with your primary care physician regarding the management of your other chronic comorbid conditions.  Patient's questions and concerns were addressed to his satisfaction. He voices understanding of the instructions provided during this encounter.   This note was created using a voice recognition software as a result there may be grammatical errors inadvertently enclosed that do not reflect the nature of this encounter. Every attempt is made to correct such errors.  Rex Kras, [place], [facility]  Pager: [phone] Office: [phone]

## 2022-03-02 ENCOUNTER — Other Ambulatory Visit: Payer: Self-pay | Admitting: Cardiology

## 2022-03-02 ENCOUNTER — Other Ambulatory Visit: Payer: Self-pay | Admitting: Nurse Practitioner

## 2022-03-02 DIAGNOSIS — E890 Postprocedural hypothyroidism: Secondary | ICD-10-CM

## 2022-03-02 DIAGNOSIS — R0609 Other forms of dyspnea: Secondary | ICD-10-CM

## 2022-03-02 DIAGNOSIS — I10 Essential (primary) hypertension: Secondary | ICD-10-CM

## 2022-03-04 ENCOUNTER — Ambulatory Visit: Payer: Medicare Other

## 2022-03-04 DIAGNOSIS — I493 Ventricular premature depolarization: Secondary | ICD-10-CM

## 2022-03-04 DIAGNOSIS — I5032 Chronic diastolic (congestive) heart failure: Secondary | ICD-10-CM

## 2022-03-04 DIAGNOSIS — R0609 Other forms of dyspnea: Secondary | ICD-10-CM

## 2022-03-05 ENCOUNTER — Other Ambulatory Visit: Payer: Self-pay | Admitting: Cardiology

## 2022-03-06 LAB — CMP14+EGFR
ALT: 21 IU/L (ref 0–44)
AST: 20 IU/L (ref 0–40)
Albumin/Globulin Ratio: 2.1 (ref 1.2–2.2)
Albumin: 4.6 g/dL (ref 3.8–4.8)
Alkaline Phosphatase: 69 IU/L (ref 44–121)
BUN/Creatinine Ratio: 19 (ref 10–24)
BUN: 25 mg/dL (ref 8–27)
Bilirubin Total: 0.5 mg/dL (ref 0.0–1.2)
CO2: 27 mmol/L (ref 20–29)
Calcium: 9.6 mg/dL (ref 8.6–10.2)
Chloride: 102 mmol/L (ref 96–106)
Creatinine, Ser: 1.32 mg/dL — ABNORMAL HIGH (ref 0.76–1.27)
Globulin, Total: 2.2 g/dL (ref 1.5–4.5)
Glucose: 105 mg/dL — ABNORMAL HIGH (ref 70–99)
Potassium: 4.2 mmol/L (ref 3.5–5.2)
Sodium: 145 mmol/L — ABNORMAL HIGH (ref 134–144)
Total Protein: 6.8 g/dL (ref 6.0–8.5)
eGFR: 55 mL/min/{1.73_m2} — ABNORMAL LOW (ref >59)

## 2022-03-06 LAB — LDL CHOLESTEROL, DIRECT: LDL Direct: 86 mg/dL (ref 0–99)

## 2022-03-06 LAB — LIPID PANEL WITH LDL/HDL RATIO
Cholesterol, Total: 144 mg/dL (ref 100–199)
HDL: 30 mg/dL — ABNORMAL LOW (ref >39)
LDL Chol Calc (NIH): 81 mg/dL (ref 0–99)
LDL/HDL Ratio: 2.7 ratio (ref 0.0–3.6)
Triglycerides: 191 mg/dL — ABNORMAL HIGH (ref 0–149)
VLDL Cholesterol Cal: 33 mg/dL (ref 5–40)

## 2022-03-10 ENCOUNTER — Other Ambulatory Visit: Payer: Medicare Other

## 2022-03-11 NOTE — Progress Notes (Signed)
Tried calling patient no answer left a vm to call back

## 2022-03-11 NOTE — Progress Notes (Signed)
Patient called back and was told results he voiced understanding

## 2022-03-18 NOTE — Progress Notes (Signed)
Called and spoke to patient he voiced understanding

## 2022-03-18 NOTE — Progress Notes (Signed)
Tried calling patient no answer left a vm to call back

## 2022-04-03 ENCOUNTER — Encounter: Payer: Self-pay | Admitting: Cardiology

## 2022-04-03 ENCOUNTER — Ambulatory Visit: Payer: Medicare Other | Admitting: Cardiology

## 2022-04-03 VITALS — BP 141/73 | HR 40 | Resp 16 | Ht 70.0 in | Wt 268.0 lb

## 2022-04-03 DIAGNOSIS — E782 Mixed hyperlipidemia: Secondary | ICD-10-CM

## 2022-04-03 DIAGNOSIS — I493 Ventricular premature depolarization: Secondary | ICD-10-CM

## 2022-04-03 DIAGNOSIS — I5032 Chronic diastolic (congestive) heart failure: Secondary | ICD-10-CM

## 2022-04-03 DIAGNOSIS — R001 Bradycardia, unspecified: Secondary | ICD-10-CM

## 2022-04-03 DIAGNOSIS — I10 Essential (primary) hypertension: Secondary | ICD-10-CM

## 2022-04-03 DIAGNOSIS — R0609 Other forms of dyspnea: Secondary | ICD-10-CM

## 2022-04-03 MED ORDER — VALSARTAN 160 MG PO TABS: 160.0000 mg | ORAL_TABLET | Freq: Every morning | ORAL | 0 refills | Status: DC

## 2022-04-03 NOTE — Progress Notes (Unsigned)
ID:  [name], DOB [birth year], MRN [id number]  PCP:  Michell Heinrich, DO  Cardiologist:  Rex Kras, DO, [facility] (established care 12/05/2021)  Date: 04/03/22 Last Office Visit: 02/13/2022  Chief Complaint  Patient presents with   Shortness of Breath   Hypertension   Results   Follow-up    HPI  [name] is a 81 y.o. Caucasian male whose past medical history and cardiovascular risk factors include: Chronic HFpEF, hypertension, hyperlipidemia, history of COVID-19 infection, BPH, obesity due to excess calories, hypothyroidism, advanced age  Patient was referred to the practice for evaluation of heart failure given his symptoms of shortness of breath at rest and with effort related activities and lower extremity swelling.  Patient has been started on treatment for HFpEF.at last office visit he was started on Entresto and metoprolol succinate given his PVCs.  His shortness of breath is improved by approximately 90% since last office visit.  He denies anginal pectoralis or overt heart failure symptoms.  His blood pressures on current medical therapy has improved significantly.  His blood pressures originally would range between 150-170 mmHg and based on his recent blood pressure log they are now less than 135 mmHg.  He is also lost 6 pounds since the last office visit.  Patient states that the Delene Loll is cost prohibitive.  He is requesting further assistance.   FUNCTIONAL STATUS: Still does his activities of daily living without any limitations.  But no structured exercise program or daily routine.  ALLERGIES: Allergies  Allergen Reactions   Meloxicam Swelling    Face and tongue swelling      MEDICATION LIST PRIOR TO VISIT: Current Meds  Medication Sig   allopurinol (ZYLOPRIM) 100 MG tablet Take 100 mg by mouth daily.   atorvastatin (LIPITOR) 20 MG tablet Take 1 tablet (20 mg total) by mouth at bedtime.   chlorthalidone (HYGROTON) 25 MG tablet TAKE 1 TABLET BY MOUTH EVERY  DAY IN THE MORNING   Cholecalciferol (VITAMIN D3) 125 MCG (5000 UT) CAPS Take by mouth.   furosemide (LASIX) 20 MG tablet Take 20 mg by mouth daily as needed for edema.   levothyroxine (SYNTHROID) 150 MCG tablet Take 1 tablet (150 mcg total) by mouth daily before breakfast.   metoprolol succinate (TOPROL XL) 25 MG 24 hr tablet Take 1 tablet (25 mg total) by mouth daily at 10 pm.   omeprazole (PRILOSEC) 40 MG capsule TAKE 1 CAPSULE BY MOUTH EVERY DAY   sacubitril-valsartan (ENTRESTO) 49-51 MG Take 1 tablet by mouth 2 (two) times daily.   tamsulosin (FLOMAX) 0.4 MG CAPS capsule Take 0.4 mg by mouth at bedtime.    vitamin C (ASCORBIC ACID) 500 MG tablet Take 500 mg by mouth daily.     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 years;  discontinued in 1981    PAST SURGICAL HISTORY: Past Surgical History:  Procedure Laterality Date   ABDOMINAL HERNIA REPAIR  1990's   BIOPSY  04/13/2018   Procedure: BIOPSY;  Surgeon: Daneil Dolin, MD;  [facility];  Service: Endoscopy;;   BIOPSY  06/27/2019   Procedure: BIOPSY;  Surgeon: Daneil Dolin, MD;  [facility];  Service: Endoscopy;;  Distal Esophagus biopsy   CATARACT EXTRACTION W/ INTRAOCULAR LENS  IMPLANT, BILATERAL Bilateral 2012-2014   COLONOSCOPY  12/2005   normal   COLONOSCOPY  2004   tubulovillous adenoma of rectum    COLONOSCOPY  01/12/2011    Dr. Gala Romney- normal rectum   COLONOSCOPY N/A 07/09/2015   Procedure: COLONOSCOPY;  Surgeon: Daneil Dolin, MD;  [facility];  Service: Endoscopy;  Laterality: N/A;  730    COLONOSCOPY N/A 01/04/2019   Procedure: COLONOSCOPY;  Surgeon: Daneil Dolin, MD;  [facility];  Service: Endoscopy;  Laterality: N/A;  10:45am   ELBOW ARTHROSCOPY Left 2014   ESOPHAGEAL DILATION N/A 02/13/2015   Procedure: ESOPHAGEAL DILATION;  Surgeon: Daneil Dolin, MD;  [facility];  Service: Endoscopy;  Laterality: N/A;   ESOPHAGOGASTRODUODENOSCOPY  01/2006   geographic erosive RE, noncritical peptic stricture s/p dilation, antral erosion   ESOPHAGOGASTRODUODENOSCOPY  01/12/2011   Dr. Gala Romney- hiatal hernia, distal esophageal erosions, barretts esophagus   ESOPHAGOGASTRODUODENOSCOPY  01/27/2012   Dr.Rourk- short segment barretts esophagus, erosive reflux esophagitis. bx= barretts esophagus, negative for dysplasia   ESOPHAGOGASTRODUODENOSCOPY N/A 02/13/2015   Dr.Rourk- ulcerative/erosive reflux esophagitis, abnormal esophagitis c/w prior diagnosis of barretts esophagus, hiatal hernia. bx=barretts esophagus   ESOPHAGOGASTRODUODENOSCOPY N/A 04/13/2018   Procedure: ESOPHAGOGASTRODUODENOSCOPY (EGD);  Surgeon: Daneil Dolin, MD;  [facility];  Service: Endoscopy;  Laterality: N/A;  2:15pm   ESOPHAGOGASTRODUODENOSCOPY N/A 06/27/2019   Procedure: ESOPHAGOGASTRODUODENOSCOPY (EGD);  Surgeon: Daneil Dolin, MD;  [facility];  Service: Endoscopy;  Laterality: N/A;  11:15am   HEMORRHOID SURGERY N/A 07/12/2015   Procedure: SIMPLE HEMORRHOIDECTOMY;  Surgeon: Aviva Signs, MD;  Location: [facility];  Service: General;  Laterality: N/A;   HERNIA REPAIR     MALONEY DILATION  01/12/2011   Procedure: [name] DILATION;  Surgeon: Daneil Dolin, MD;  [facility];  Service: Endoscopy;  Laterality: N/A;  56 french   [place] N/A 04/13/2018   Procedure:  MALONEY DILATION;  Surgeon: Daneil Dolin, MD;  [facility];  Service: Endoscopy;  Laterality: N/A;   MALONEY DILATION N/A 06/27/2019   Procedure: [name] DILATION;  Surgeon: Daneil Dolin, MD;  [facility];  Service: Endoscopy;  Laterality: N/A;   SQUAMOUS CELL CARCINOMA EXCISION     "cut a piece of skin, off my hands; left arm; under right lip; right ear" (01/05/2014)   THYROIDECTOMY, PARTIAL Right 1969   lobe-nodule present   TOTAL KNEE ARTHROPLASTY Left 01/05/2014   Procedure: LEFT TOTAL KNEE ARTHROPLASTY;  Surgeon: Mcarthur Rossetti, MD;  Location: [place];  Service: Orthopedics;  Laterality: Left;   TOTAL KNEE ARTHROPLASTY Right 05/04/2014   Procedure: RIGHT TOTAL KNEE ARTHROPLASTY;  Surgeon: Mcarthur Rossetti, MD;  Location: [facility];  Service: Orthopedics;  Laterality: Right;   UMBILICAL HERNIA REPAIR  2000   WISDOM TOOTH EXTRACTION  06/19/2020    FAMILY HISTORY: The patient family history includes Colon polyps in his brother; Lung cancer (age of onset: 79) in his father.  SOCIAL HISTORY:  The patient  reports that he quit smoking about 41 years ago. His smoking use included cigarettes. He started smoking about 65 years ago. He has a 20.00 pack-year smoking history. He has never used smokeless tobacco. He reports that he does not currently use alcohol. He reports that he does not use drugs.  REVIEW OF SYSTEMS: Review of Systems  Cardiovascular:  Positive for dyspnea on exertion (improving). Negative for chest pain, leg swelling, near-syncope, orthopnea, palpitations, paroxysmal nocturnal dyspnea and syncope.  Respiratory:  Positive for shortness of breath (improving) and snoring.   Hematologic/Lymphatic: Negative for bleeding problem.    PHYSICAL EXAM:    04/03/2022   11:25 AM 02/13/2022    2:49 PM 01/16/2022    1:45 PM  Vitals with BMI  Height 5' 10" 5' 10" 5' 10"  Weight 268 lbs 269 lbs 275 lbs 13 oz  BMI 38.45 09.3 23.55  Systolic 732 202  542  Diastolic 73 77 77  Pulse 40 56 57    Physical Exam  Constitutional: No distress.  Age appropriate, hemodynamically stable.   Neck: No JVD present.  Cardiovascular: Normal rate, regular rhythm, S1 normal, S2 normal, intact distal pulses and normal pulses. Exam reveals no gallop, no S3 and no S4.  No murmur heard. Pulses:      Dorsalis pedis pulses are 2+ on the right side and 2+ on the left side.       Posterior tibial pulses are 2+ on the right side and 2+ on the left side.  Pulmonary/Chest: Effort normal and breath sounds normal. No stridor. He has no wheezes. He has no rales.  Abdominal: Soft. Bowel sounds are normal. He exhibits no distension. There is no abdominal tenderness.  Musculoskeletal:        General: No edema.     Cervical back: Neck supple.  Neurological: He is alert and oriented to person, place, and time. He has intact cranial nerves (2-12).  Skin: Skin is warm and moist.    CARDIAC DATABASE: EKG: December 05, 2021: Normal sinus rhythm, 74 bpm, frequent PVCs. 04/03/22 Sinus bradycardia, 44 bpm, first-degree AV block, PAC.  Echocardiogram: 09/25/2015: Technically difficult study, LVEF 60 to 70%, grade 1 diastolic impairment, mild MR, moderate LAE, liver cyst 4 x 5.5 cm.  12/18/2021: Left ventricle cavity is normal in size. Mild concentric hypertrophy of the left ventricle. Normal global wall motion. Normal LV systolic function with EF 62%. Doppler evidence of grade I (impaired) diastolic dysfunction, normal LAP.  The aortic root is borderline dilated at 3.9 cm. Left atrial cavity is mildly dilated. Aneurysmal interatrial septum without 2D or color Doppler evidence of shunting. Mild to moderate mitral regurgitation. Mild tricuspid regurgitation. Mild pulmonic regurgitation. IVC is not seen.  Incidental finding of large echolucent structure in lives, possible cyst. Recommend clinical correlation.   Stress Testing: Lexiscan (with Mod Bruce protocol) Nuclear stress  test 03/10/2022 Myocardial perfusion is normal. Low risk study. Overall LV systolic function is normal without regional wall motion abnormalities. Stress LV EF: 58%.  Nondiagnostic ECG stress. The heart rate response was consistent with Regadenoson. The blood pressure response was physiologic. No previous exam available for comparison.  Coronary calcium score 03/05/2022 Left main: 11. LAD 68. LCx 14. RCA 0. PDA: 0. Total coronary calcium score 93, 23rd percentile. Emphysema with small nodules bilaterally measuring 0.4 cm within the right upper lobe.  Heart Catheterization: None  LABORATORY DATA:    Latest Ref Rng & Units 07/10/2015   11:00 AM 05/05/2014  4:55 AM 04/27/2014    9:35 AM  CBC  WBC 4.0 - 10.5 K/uL 8.5  14.2  6.8   Hemoglobin 13.0 - 17.0 g/dL 14.3  11.5  14.0   Hematocrit 39.0 - 52.0 % 42.1  34.8  41.6   Platelets 150 - 400 K/uL 181  193  206        Latest Ref Rng & Units 03/05/2022    1:54 PM 01/26/2022   10:03 AM 12/05/2021   10:41 AM  CMP  Glucose 70 - 99 mg/dL 105  94  94   BUN 8 - 27 mg/dL _0 Creatinine 0.76 - 1.27 mg/dL 1.32  1.42  1.34   Sodium 134 - 144 mmol/L 145  144  144   Potassium 3.5 - 5.2 mmol/L 4.2  4.4  4.0   Chloride 96 - 106 mmol/L 102  102  106   CO2 20 - 29 mmol/L _1 Calcium 8.6 - 10.2 mg/dL 9.6  9.9  9.5   Total Protein 6.0 - 8.5 g/dL 6.8     Total Bilirubin 0.0 - 1.2 mg/dL 0.5     Alkaline Phos 44 - 121 IU/L 69     AST 0 - 40 IU/L 20     ALT 0 - 44 IU/L 21       Lipid Panel  Lab Results  Component Value Date   CHOL 144 03/05/2022   HDL 30 (L) 03/05/2022   LDLCALC 81 03/05/2022   LDLDIRECT 86 03/05/2022   TRIG 191 (H) 03/05/2022   CHOLHDL 4.1 11/27/2020   No components found for: "NTPROBNP" Recent Labs    12/05/21 1041  PROBNP 148    No results for input(s): "TSH" in the last 8760 hours.  BMP Recent Labs    12/05/21 1041 01/26/22 1003 03/05/22 1354  NA 144 144 145*  K 4.0 4.4 4.2  CL 106 102  102  CO2 _2 GLUCOSE 94 94 105*  BUN _3 CREATININE 1.34* 1.42* 1.32*  CALCIUM 9.5 9.9 9.6     HEMOGLOBIN A1C No results found for: "HGBA1C", "MPG"  External Labs:  Date Collected: 11/11/2021 , information obtained by referring physician BNP 126 Potassium: 4.0 Creatinine 1.15 mg/dL. eGFR: 64 mL/min per 1.73 m Hemoglobin: 13.8 g/dL and hematocrit: 40.3 % Lipid profile: Total cholesterol 168 , triglycerides 190 , HDL 36 , LDL 101 non-HDL 132 AST: 18 , ALT: 19 , alkaline phosphatase: 72  TSH: 3.93    External Labs: Collected: 01/30/2022 provided by the patient. BNP 338 BUN 23, creatinine 1.42. eGFR 50. Sodium 144, potassium 4.4, chloride 102, bicarb 25  Collected 03/06/2022 provided by the patient Total cholesterol 144, triglycerides 191, HDL 38, LDL 81 Direct LDL 86 BUN 25, creatinine 1.32. Sodium 145, potassium 4.2, chloride 102, bicarb 27. AST 20, ALT 21, alkaline phosphatase 69 Magnesium 1.8  IMPRESSION:  No diagnosis found.    RECOMMENDATIONS: [name] is a 82 y.o. Caucasian male whose past medical history and cardiac risk factors include: HFpEF, hypertension, hyperlipidemia, history of COVID-19 infection, BPH, obesity due to excess calories, hypothyroidism, advanced age.   Dyspnea on exertion Improving. Blood pressures are now better controlled at home-blood pressure log reviewed. Has tolerated the uptitration of HFpEF treatment. We will proceed with ischemic work-up with a coronary calcium score and exercise nuclear stress test to evaluate for reversible ischemia  Chronic heart failure with preserved ejection  fraction (HFpEF) (HCC) Stage B NYHA class II. Echo: Preserved LVEF, grade 1 diastolic impairment, mild to moderate valvular heart disease. Tolerated the transition from valsartan to Entresto 49/51 mg p.o. daily.  However the medication may be cost prohibitive long-term.  Samples for another month provided and patient assistance forms  filled out and awaiting response. Continue current medical therapy. Educated on importance of improving his modifiable cardiovascular risk factors which include but not limited to weight loss, glycemic control, lipid and triglyceride management. Ischemic work-up as outlined above.  PVC's (premature ventricular contractions) Asymptomatic. Continue Toprol-XL. Monitor for now  Benign hypertension Office blood pressures are not well controlled. Home blood pressures are very well controlled.  Home blood pressure log reviewed. Reemphasized importance of low-salt diet.  Patient states that he is made a significant improvement with salt reduction. Has lost 6 pounds since last office visit. No further medication titration warranted at this time.  Mixed hyperlipidemia We will transition him from lovastatin to atorvastatin. We will repeat labs in 6 weeks to reevaluate lipids and LFTs. Patient is also asked to be cognizant of reducing his carbohydrate and triglyceride content on a daily basis. Further recommendations to follow  Snoring Refer to sleep medicine for sleep apnea evaluation  Class 2 severe obesity due to excess calories with serious comorbidity and body mass index (BMI) of 38.0 to 38.9 in adult [facility]) Body mass index is 38.45 kg/m. I reviewed with the patient the importance of diet, regular physical activity/exercise, weight loss.   Patient is educated on increasing physical activity gradually as tolerated.  With the goal of moderate intensity exercise for 30 minutes a day 5 days a week.  FINAL MEDICATION LIST END OF ENCOUNTER: No orders of the defined types were placed in this encounter.   There are no discontinued medications.   Current Outpatient Medications:    allopurinol (ZYLOPRIM) 100 MG tablet, Take 100 mg by mouth daily., Disp: , Rfl:    atorvastatin (LIPITOR) 20 MG tablet, Take 1 tablet (20 mg total) by mouth at bedtime., Disp: 90 tablet, Rfl: 0   chlorthalidone  (HYGROTON) 25 MG tablet, TAKE 1 TABLET BY MOUTH EVERY DAY IN THE MORNING, Disp: 90 tablet, Rfl: 0   Cholecalciferol (VITAMIN D3) 125 MCG (5000 UT) CAPS, Take by mouth., Disp: , Rfl:    furosemide (LASIX) 20 MG tablet, Take 20 mg by mouth daily as needed for edema., Disp: , Rfl:    levothyroxine (SYNTHROID) 150 MCG tablet, Take 1 tablet (150 mcg total) by mouth daily before breakfast., Disp: 90 tablet, Rfl: 3   metoprolol succinate (TOPROL XL) 25 MG 24 hr tablet, Take 1 tablet (25 mg total) by mouth daily at 10 pm., Disp: 90 tablet, Rfl: 0   omeprazole (PRILOSEC) 40 MG capsule, TAKE 1 CAPSULE BY MOUTH EVERY DAY, Disp: 90 capsule, Rfl: 3   sacubitril-valsartan (ENTRESTO) 49-51 MG, Take 1 tablet by mouth 2 (two) times daily., Disp: 180 tablet, Rfl: 3   tamsulosin (FLOMAX) 0.4 MG CAPS capsule, Take 0.4 mg by mouth at bedtime. , Disp: , Rfl:    vitamin C (ASCORBIC ACID) 500 MG tablet, Take 500 mg by mouth daily., Disp: , Rfl:   No orders of the defined types were placed in this encounter.   There are no Patient Instructions on file for this visit.   --Continue cardiac medications as reconciled in final medication list. --No follow-ups on file. or sooner if needed. --Continue follow-up with your primary care physician regarding the management of  your other chronic comorbid conditions.  Patient's questions and concerns were addressed to his satisfaction. He voices understanding of the instructions provided during this encounter.   This note was created using a voice recognition software as a result there may be grammatical errors inadvertently enclosed that do not reflect the nature of this encounter. Every attempt is made to correct such errors.  Rex Kras, [place], [facility]  Pager: [phone] Office: [phone]

## 2022-04-09 ENCOUNTER — Ambulatory Visit: Payer: PRIVATE HEALTH INSURANCE | Admitting: Cardiology

## 2022-04-13 ENCOUNTER — Other Ambulatory Visit: Payer: Self-pay | Admitting: Cardiology

## 2022-04-13 DIAGNOSIS — I5032 Chronic diastolic (congestive) heart failure: Secondary | ICD-10-CM

## 2022-04-15 ENCOUNTER — Ambulatory Visit (INDEPENDENT_AMBULATORY_CARE_PROVIDER_SITE_OTHER): Payer: Medicare Other | Admitting: Gastroenterology

## 2022-04-15 ENCOUNTER — Encounter: Payer: Self-pay | Admitting: Gastroenterology

## 2022-04-15 ENCOUNTER — Encounter: Payer: Self-pay | Admitting: *Deleted

## 2022-04-15 ENCOUNTER — Other Ambulatory Visit: Payer: Self-pay | Admitting: Cardiology

## 2022-04-15 VITALS — BP 170/108 | HR 65 | Temp 97.8°F | Ht 70.0 in | Wt 266.2 lb

## 2022-04-15 DIAGNOSIS — K2270 Barrett's esophagus without dysplasia: Secondary | ICD-10-CM | POA: Diagnosis not present

## 2022-04-15 DIAGNOSIS — R1310 Dysphagia, unspecified: Secondary | ICD-10-CM

## 2022-04-15 NOTE — Progress Notes (Signed)
Gastroenterology Office Note     Primary Care Physician:  Michell Heinrich, DO  Primary Gastroenterologist: Dr. Gala Romney    Chief Complaint   Chief Complaint  Patient presents with   Gastroesophageal Reflux    Routine follow up on Barrett's esophagus and GERD. Takes omeprazole at night and patient reports no concerns today.      History of Present Illness   [name] is an 81 y.o. male presenting today in follow-up with a history of Barrett's esophagus, symptomatic hemorrhoids s/p banding earlier this year.   He returns for follow-up of GERD. Taking omeprazole once daily. No abdominal pain, N/V. He does note dysphagia to chicken and biscuits. He is unable to quantify how long this has been going on. Dilation completed in the past with good results. Last EGD in 2020 without Barrett's on path but Barrett's noted in 2019. No further issues with hemorrhoids.    EGD December 2020: - Salmon-colored mucosa. Dilated /subsequent biopsy - Small hiatal hernia. - Normal duodenal bulb and second portion of the duodenum. -Esophageal biopsy showed gastroesophageal mucosal with mild inflammation consistent with reflux.  No intestinal metaplasia, dysplasia or malignancy. -Biopsy in 2019 consistent with Barrett's and at that time PPI was recommended but no future endoscopy unless new symptoms develop.      Past Medical History:  Diagnosis Date   Arthritis    "fingers; knees; toes" (01/05/2014)   Barrett's esophagus    BPH (benign prostatic hyperplasia)    Bradycardia 2009   Sinus rhythm in the 40s without symptoms;   GERD (gastroesophageal reflux disease)    ulcerative/erosive   Gout    Hemorrhoids    Hiatal hernia    History of skin cancer    Hx of adenomatous colonic polyps    Hyperlipidemia    Hypertension    Hypothyroidism    Following partial thyroidectomy   Squamous carcinoma    skin cancer benign arms and right ear and rightside lip   Tobacco abuse, in remission     10-20 pack years; discontinued in 1981    Past Surgical History:  Procedure Laterality Date   ABDOMINAL HERNIA REPAIR  1990's   BIOPSY  04/13/2018   Procedure: BIOPSY;  Surgeon: Daneil Dolin, MD;  [facility];  Service: Endoscopy;;   BIOPSY  06/27/2019   Procedure: BIOPSY;  Surgeon: Daneil Dolin, MD;  [facility];  Service: Endoscopy;;  Distal Esophagus biopsy   CATARACT EXTRACTION W/ INTRAOCULAR LENS  IMPLANT, BILATERAL Bilateral 2012-2014   COLONOSCOPY  12/2005   normal   COLONOSCOPY  2004   tubulovillous adenoma of rectum    COLONOSCOPY  01/12/2011   Dr. Gala Romney- normal rectum   COLONOSCOPY N/A 07/09/2015   Procedure: COLONOSCOPY;  Surgeon: Daneil Dolin, MD;  [facility];  Service: Endoscopy;  Laterality: N/A;  730    COLONOSCOPY N/A 01/04/2019   Procedure: COLONOSCOPY;  Surgeon: Daneil Dolin, MD;  [facility];  Service: Endoscopy;  Laterality: N/A;  10:45am   ELBOW ARTHROSCOPY Left 2014   ESOPHAGEAL DILATION N/A 02/13/2015   Procedure: ESOPHAGEAL DILATION;  Surgeon: Daneil Dolin, MD;  [facility];  Service: Endoscopy;  Laterality: N/A;   ESOPHAGOGASTRODUODENOSCOPY  01/2006   geographic erosive RE, noncritical peptic stricture s/p dilation, antral erosion   ESOPHAGOGASTRODUODENOSCOPY  01/12/2011   Dr. Gala Romney- hiatal hernia, distal esophageal erosions, barretts esophagus   ESOPHAGOGASTRODUODENOSCOPY  01/27/2012   Dr.Rourk- short segment  barretts esophagus, erosive reflux esophagitis. bx= barretts esophagus, negative for dysplasia   ESOPHAGOGASTRODUODENOSCOPY N/A 02/13/2015   Dr.Rourk- ulcerative/erosive reflux esophagitis, abnormal esophagitis c/w prior diagnosis of barretts esophagus, hiatal hernia. bx=barretts esophagus   ESOPHAGOGASTRODUODENOSCOPY N/A 04/13/2018   Procedure: ESOPHAGOGASTRODUODENOSCOPY (EGD);  Surgeon: Daneil Dolin, MD;  [facility];  Service: Endoscopy;  Laterality: N/A;   2:15pm   ESOPHAGOGASTRODUODENOSCOPY N/A 06/27/2019   salmon-colored mucosa of esophagus with biopsy negative for Barrett's. Biopsy in 2019 consitent with Barrett's. normal duodenal bulb, small hiatal hernia.   HEMORRHOID SURGERY N/A 07/12/2015   Procedure: SIMPLE HEMORRHOIDECTOMY;  Surgeon: Aviva Signs, MD;  Location: [facility];  Service: General;  Laterality: N/A;   HERNIA REPAIR     MALONEY DILATION  01/12/2011   Procedure: [name] DILATION;  Surgeon: Daneil Dolin, MD;  [facility];  Service: Endoscopy;  Laterality: N/A;  56 french   [place] N/A 04/13/2018   Procedure: MALONEY DILATION;  Surgeon: Daneil Dolin, MD;  [facility];  Service: Endoscopy;  Laterality: N/A;   MALONEY DILATION N/A 06/27/2019   Procedure: [name] DILATION;  Surgeon: Daneil Dolin, MD;  [facility];  Service: Endoscopy;  Laterality: N/A;   SQUAMOUS CELL CARCINOMA EXCISION     "cut a piece of skin, off my hands; left arm; under right lip; right ear" (01/05/2014)   THYROIDECTOMY, PARTIAL Right 1969   lobe-nodule present   TOTAL KNEE ARTHROPLASTY Left 01/05/2014   Procedure: LEFT TOTAL KNEE ARTHROPLASTY;  Surgeon: Mcarthur Rossetti, MD;  Location: [facility];  Service: Orthopedics;  Laterality: Left;   TOTAL KNEE ARTHROPLASTY Right 05/04/2014   Procedure: RIGHT TOTAL KNEE ARTHROPLASTY;  Surgeon: Mcarthur Rossetti, MD;  Location: [facility];  Service: Orthopedics;  Laterality: Right;   UMBILICAL HERNIA REPAIR  2000   WISDOM TOOTH EXTRACTION  06/19/2020    Current Outpatient Medications  Medication Sig Dispense Refill   allopurinol (ZYLOPRIM) 100 MG tablet Take 100 mg by mouth daily.     atorvastatin (LIPITOR) 20 MG tablet Take 1 tablet (20 mg total) by mouth at bedtime. 90 tablet 0   chlorthalidone (HYGROTON) 25 MG tablet TAKE 1 TABLET BY MOUTH EVERY DAY IN THE MORNING 90 tablet 0   Cholecalciferol (VITAMIN D3) 125 MCG (5000 UT) CAPS Take by mouth daily.      furosemide (LASIX) 20 MG tablet Take 20 mg by mouth daily as needed for edema.     levothyroxine (SYNTHROID) 150 MCG tablet Take 1 tablet (150 mcg total) by mouth daily before breakfast. 90 tablet 3   omeprazole (PRILOSEC) 40 MG capsule TAKE 1 CAPSULE BY MOUTH EVERY DAY 90 capsule 3   tamsulosin (FLOMAX) 0.4 MG CAPS capsule Take 0.4 mg by mouth at bedtime.      valsartan (DIOVAN) 160 MG tablet Take 1 tablet (160 mg total) by mouth every morning. 90 tablet 0   vitamin C (ASCORBIC ACID) 500 MG tablet Take 500 mg by mouth daily.     No current facility-administered medications for this visit.    Allergies as of 04/15/2022 - Review Complete 04/15/2022  Allergen Reaction Noted   Meloxicam Swelling 06/20/2020    Family History  Problem Relation Age of Onset   Lung cancer Father 50   Colon polyps Brother    Colon cancer Neg Hx    Liver disease Neg Hx     Social History   Socioeconomic History   Marital status: Married    Spouse  name: Not on file   Number of children: 2   Years of education: Not on file   Highest education level: Not on file  Occupational History   Occupation: [employer]: Current employment   Occupation: Engineer, civil (consulting)    Comment: Prior employment  Tobacco Use   Smoking status: Former    Packs/day: 1.00    Years: 20.00    Total pack years: 20.00    Types: Cigarettes    Start date: 07/06/1956    Quit date: 07/06/1980    Years since quitting: 41.8    Passive exposure: Past   Smokeless tobacco: Never   Tobacco comments:    "quit smoking in ~ 1982"  Vaping Use   Vaping Use: Never used  Substance and Sexual Activity   Alcohol use: Not Currently    Comment: occ beer   Drug use: No   Sexual activity: Yes    Partners: Female    Comment: spouse  Other Topics Concern   Not on file  Social History Narrative   Not on file   Social Determinants of Health   Financial Resource Strain: Not on file  Food Insecurity: Not on file   Transportation Needs: Not on file  Physical Activity: Not on file  Stress: Not on file  Social Connections: Not on file  Intimate Partner Violence: Not on file     Review of Systems   Gen: Denies any fever, chills, fatigue, weight loss, lack of appetite.  CV: Denies chest pain, heart palpitations, peripheral edema, syncope.  Resp: Denies shortness of breath at rest or with exertion. Denies wheezing or cough.  GI: see HPI  GU : Denies urinary burning, urinary frequency, urinary hesitancy MS: Denies joint pain, muscle weakness, cramps, or limitation of movement.  Derm: Denies rash, itching, dry skin Psych: Denies depression, anxiety, memory loss, and confusion Heme: Denies bruising, bleeding, and enlarged lymph nodes.   Physical Exam   BP (!) 170/108 (BP Location: Left Arm, Patient Position: Sitting, Cuff Size: Large) Comment: recheck 162/88. has not taken bp med today  Pulse 65   Temp 97.8 F (36.6 C) (Oral)   Ht '5\' 10"'$  (1.778 m)   Wt 266 lb 3.2 oz (120.7 kg)   BMI 38.20 kg/m  General:   Alert and oriented. Pleasant and cooperative. Well-nourished and well-developed.  Head:  Normocephalic and atraumatic. Eyes:  Without icterus Cardiac: S1 S2 present without murmurs Lungs: clear bilaterally Abdomen:  +BS, soft, non-tender and non-distended. No HSM noted. No guarding or rebound. No masses appreciated.  Rectal:  Deferred  Msk:  Symmetrical without gross deformities. Normal posture. Extremities:  Without edema. Neurologic:  Alert and  oriented x4;  grossly normal neurologically. Skin:  Intact without significant lesions or rashes. Psych:  Alert and cooperative. Normal mood and affect.   Assessment   [name] is an 81 y.o. male presenting today with a history of Barrett's esophagus, GERD, prior symptomatic hemorrhoids responding well to banding, for routine follow-up. Now with solid food dysphagia.  Dysphagia: to breads/chicken. Prior dilation in 2020 with  improvement. Will arrange dilation in near future.  Barrett's/GERD: doing well on omeprazole daily. Last EGD for surveillance in 2020 with suspicious changes for Barrett's but path negative; prior path in 2019 consistent with Barrett's. No further EGD had been planned due to age but upcoming EGD/dilation to be arranged for dysphagia.   PLAN    Proceed with upper endoscopy/dilation by Dr. Gala Romney in near future:  the risks, benefits, and alternatives have been discussed with the patient in detail. The patient states understanding and desires to proceed.  Continue omeprazole daily Follow-up in 6 months   Annitta Needs, PhD, [facility] Gastroenterology

## 2022-04-15 NOTE — Patient Instructions (Signed)
We are arranging an endoscopy with dilation by Dr. Gala Romney in the near future!  Continue omeprazole daily.  We will see you back in 6 months!  I enjoyed seeing you again today! As you know, I value our relationship and want to provide genuine, compassionate, and quality care. I welcome your feedback. If you receive a survey regarding your visit,  I greatly appreciate you taking time to fill this out. See you next time!  Annitta Needs, PhD, ANP-BC [facility] Gastroenterology

## 2022-04-22 LAB — BASIC METABOLIC PANEL
BUN/Creatinine Ratio: 14 (ref 10–24)
BUN: 19 mg/dL (ref 8–27)
CO2: 27 mmol/L (ref 20–29)
Calcium: 9.8 mg/dL (ref 8.6–10.2)
Chloride: 104 mmol/L (ref 96–106)
Creatinine, Ser: 1.33 mg/dL — ABNORMAL HIGH (ref 0.76–1.27)
Glucose: 100 mg/dL — ABNORMAL HIGH (ref 70–99)
Potassium: 4.2 mmol/L (ref 3.5–5.2)
Sodium: 144 mmol/L (ref 134–144)
eGFR: 54 mL/min/{1.73_m2} — ABNORMAL LOW (ref >59)

## 2022-04-22 LAB — SPECIMEN STATUS REPORT

## 2022-04-22 LAB — BRAIN NATRIURETIC PEPTIDE: BNP: 48 pg/mL (ref 0.0–100.0)

## 2022-04-23 NOTE — Progress Notes (Signed)
Called patient  to give him his lab results. Pt understood.

## 2022-04-27 ENCOUNTER — Other Ambulatory Visit: Payer: Medicare Other

## 2022-05-04 ENCOUNTER — Ambulatory Visit: Payer: PRIVATE HEALTH INSURANCE | Admitting: Cardiology

## 2022-05-12 ENCOUNTER — Other Ambulatory Visit: Payer: Self-pay | Admitting: Cardiology

## 2022-05-12 DIAGNOSIS — E782 Mixed hyperlipidemia: Secondary | ICD-10-CM

## 2022-05-18 ENCOUNTER — Encounter (HOSPITAL_COMMUNITY): Payer: Self-pay

## 2022-05-18 ENCOUNTER — Other Ambulatory Visit: Payer: Self-pay

## 2022-05-18 ENCOUNTER — Encounter (HOSPITAL_COMMUNITY)
Admission: RE | Admit: 2022-05-18 | Discharge: 2022-05-18 | Disposition: A | Discharge status: Home / Self Care | Payer: Medicare Other | Source: Ambulatory Visit | Attending: Internal Medicine | Admitting: Internal Medicine

## 2022-05-20 ENCOUNTER — Ambulatory Visit (HOSPITAL_BASED_OUTPATIENT_CLINIC_OR_DEPARTMENT_OTHER): Payer: Medicare Other | Admitting: Anesthesiology

## 2022-05-20 ENCOUNTER — Ambulatory Visit (HOSPITAL_COMMUNITY)
Admission: RE | Admit: 2022-05-20 | Discharge: 2022-05-20 | Disposition: A | Discharge status: Home / Self Care | Payer: Medicare Other | Attending: Internal Medicine | Admitting: Internal Medicine

## 2022-05-20 ENCOUNTER — Encounter (HOSPITAL_COMMUNITY)
Admission: RE | Disposition: A | Discharge status: Home / Self Care | Payer: Self-pay | Source: Home / Self Care | Attending: Internal Medicine

## 2022-05-20 ENCOUNTER — Ambulatory Visit (HOSPITAL_COMMUNITY): Payer: Medicare Other | Admitting: Anesthesiology

## 2022-05-20 ENCOUNTER — Encounter (HOSPITAL_COMMUNITY): Payer: Self-pay | Admitting: Internal Medicine

## 2022-05-20 DIAGNOSIS — R1310 Dysphagia, unspecified: Secondary | ICD-10-CM | POA: Diagnosis present

## 2022-05-20 DIAGNOSIS — K219 Gastro-esophageal reflux disease without esophagitis: Secondary | ICD-10-CM | POA: Diagnosis not present

## 2022-05-20 DIAGNOSIS — K319 Disease of stomach and duodenum, unspecified: Secondary | ICD-10-CM | POA: Insufficient documentation

## 2022-05-20 DIAGNOSIS — K449 Diaphragmatic hernia without obstruction or gangrene: Secondary | ICD-10-CM

## 2022-05-20 DIAGNOSIS — E039 Hypothyroidism, unspecified: Secondary | ICD-10-CM | POA: Diagnosis not present

## 2022-05-20 DIAGNOSIS — I110 Hypertensive heart disease with heart failure: Secondary | ICD-10-CM | POA: Insufficient documentation

## 2022-05-20 DIAGNOSIS — I509 Heart failure, unspecified: Secondary | ICD-10-CM | POA: Insufficient documentation

## 2022-05-20 DIAGNOSIS — Z87891 Personal history of nicotine dependence: Secondary | ICD-10-CM | POA: Diagnosis not present

## 2022-05-20 DIAGNOSIS — K2950 Unspecified chronic gastritis without bleeding: Secondary | ICD-10-CM | POA: Diagnosis not present

## 2022-05-20 DIAGNOSIS — Z79899 Other long term (current) drug therapy: Secondary | ICD-10-CM | POA: Insufficient documentation

## 2022-05-20 HISTORY — PX: ESOPHAGOGASTRODUODENOSCOPY (EGD) WITH PROPOFOL: SHX5813

## 2022-05-20 HISTORY — PX: BIOPSY: SHX5522

## 2022-05-20 HISTORY — PX: MALONEY DILATION: SHX5535

## 2022-05-20 SURGERY — ESOPHAGOGASTRODUODENOSCOPY (EGD) WITH PROPOFOL
Anesthesia: General

## 2022-05-20 MED ORDER — LACTATED RINGERS IV SOLN: INTRAVENOUS | Status: DC

## 2022-05-20 MED ORDER — PROPOFOL 500 MG/50ML IV EMUL
INTRAVENOUS | Status: DC | PRN
  Administered 2022-05-20: 150 ug/kg/min via INTRAVENOUS

## 2022-05-20 MED ORDER — LACTATED RINGERS IV SOLN: INTRAVENOUS | Status: DC | PRN

## 2022-05-20 MED ORDER — PROPOFOL 10 MG/ML IV BOLUS
INTRAVENOUS | Status: DC | PRN
  Administered 2022-05-20: 50 mg via INTRAVENOUS

## 2022-05-20 NOTE — Transfer of Care (Signed)
Immediate Anesthesia Transfer of Care Note  Patient: [name]  Procedure(s) Performed: ESOPHAGOGASTRODUODENOSCOPY (EGD) WITH PROPOFOL MALONEY DILATION BIOPSY  Patient Location: PACU  Anesthesia Type:General  Level of Consciousness: awake  Airway & Oxygen Therapy: Patient Spontanous Breathing  Post-op Assessment: Report given to RN and Post -op Vital signs reviewed and stable  Post vital signs: Reviewed and stable  Last Vitals:  Vitals Value Taken Time  BP    Temp    Pulse    Resp    SpO2      Last Pain:  Vitals:   05/20/22 0923  TempSrc:   PainSc: 0-No pain         Complications: No notable events documented.

## 2022-05-20 NOTE — Discharge Instructions (Addendum)
EGD Discharge instructions Please read the instructions outlined below and refer to this sheet in the next few weeks. These discharge instructions provide you with general information on caring for yourself after you leave the hospital. Your doctor may also give you specific instructions. While your treatment has been planned according to the most current medical practices available, unavoidable complications occasionally occur. If you have any problems or questions after discharge, please call your doctor. ACTIVITY You may resume your regular activity but move at a slower pace for the next 24 hours.  Take frequent rest periods for the next 24 hours.  Walking will help expel (get rid of) the air and reduce the bloated feeling in your abdomen.  No driving for 24 hours (because of the anesthesia (medicine) used during the test).  You may shower.  Do not sign any important legal documents or operate any machinery for 24 hours (because of the anesthesia used during the test).  NUTRITION Drink plenty of fluids.  You may resume your normal diet.  Begin with a light meal and progress to your normal diet.  Avoid alcoholic beverages for 24 hours or as instructed by your caregiver.  MEDICATIONS You may resume your normal medications unless your caregiver tells you otherwise.  WHAT YOU CAN EXPECT TODAY You may experience abdominal discomfort such as a feeling of fullness or "gas" pains.  FOLLOW-UP Your doctor will discuss the results of your test with you.  SEEK IMMEDIATE MEDICAL ATTENTION IF ANY OF THE FOLLOWING OCCUR: Excessive nausea (feeling sick to your stomach) and/or vomiting.  Severe abdominal pain and distention (swelling).  Trouble swallowing.  Temperature over 101 F (37.8 C).  Rectal bleeding or vomiting of blood.    Your esophagus looked good.  It was dilated.  Stomach inflamed.-Biopsies taken.  Continue omeprazole 40 mg daily.  Further recommendations to follow pending review of  pathology report   office visit with [place] in 3 months   at patient request, I called Michel Santee at [phone] findings and recommendations

## 2022-05-20 NOTE — Anesthesia Preprocedure Evaluation (Signed)
Anesthesia Evaluation  Patient identified by MRN, date of birth, ID band Patient awake    Reviewed: Allergy & Precautions, H&P , NPO status , Patient's Chart, lab work & pertinent test results, reviewed documented beta blocker date and time   History of Anesthesia Complications Negative for: history of anesthetic complications  Airway Mallampati: II  TM Distance: >3 FB Neck ROM: full    Dental no notable dental hx.    Pulmonary neg pulmonary ROS, former smoker   Pulmonary exam normal breath sounds clear to auscultation       Cardiovascular Exercise Tolerance: Good hypertension, +CHF  II Rhythm:regular Rate:Normal     Neuro/Psych negative neurological ROS  negative psych ROS   GI/Hepatic Neg liver ROS, hiatal hernia,GERD  ,,Barrett's esophagus   Endo/Other  Hypothyroidism    Renal/GU negative Renal ROS  negative genitourinary   Musculoskeletal   Abdominal   Peds  Hematology negative hematology ROS (+)   Anesthesia Other Findings EKG 04/03/22: Sinus brady, 1st degree block; PVCs  Nuclear stress test 03/10/2022: Myocardial perfusion is normal. Low risk study. Overall LV systolic function is normal without regional wall motion abnormalities. Stress LV EF: 58%.    Reproductive/Obstetrics negative OB ROS                             Anesthesia Physical Anesthesia Plan  ASA: 3  Anesthesia Plan: General   Post-op Pain Management:    Induction:   PONV Risk Score and Plan:   Airway Management Planned:   Additional Equipment:   Intra-op Plan:   Post-operative Plan:   Informed Consent: I have reviewed the patients History and Physical, chart, labs and discussed the procedure including the risks, benefits and alternatives for the proposed anesthesia with the patient or authorized representative who has indicated his/her understanding and acceptance.     Dental Advisory  Given  Plan Discussed with: CRNA  Anesthesia Plan Comments:         Anesthesia Quick Evaluation

## 2022-05-20 NOTE — Op Note (Signed)
[facility] Patient Name: [name] Procedure Date: 05/20/2022 9:11 AM MRN: [id number] Date of Birth: Jan 07, 1941 Attending MD: Norvel Richards , MD, [id number] CSN: [id number] Age: 81 Admit Type: Outpatient Procedure:                Upper GI endoscopy Indications:              Dysphagia Providers:                Norvel Richards, MD, Caprice Kluver, Ladoris Gene                            Technician, Technician Referring MD:              Medicines:                Propofol per Anesthesia Complications:            No immediate complications. Estimated Blood Loss:     Estimated blood loss was minimal. Procedure:                Pre-Anesthesia Assessment:                           - Prior to the procedure, a History and Physical                            was performed, and patient medications and                            allergies were reviewed. The patient's tolerance of                            previous anesthesia was also reviewed. The risks                            and benefits of the procedure and the sedation                            options and risks were discussed with the patient.                            All questions were answered, and informed consent                            was obtained. Prior Anticoagulants: The patient has                            taken no anticoagulant or antiplatelet agents. ASA                            Grade Assessment: III - A patient with severe                            systemic disease. After reviewing the risks and  benefits, the patient was deemed in satisfactory                            condition to undergo the procedure.                           After obtaining informed consent, the endoscope was                            passed under direct vision. Throughout the                            procedure, the patient's blood pressure, pulse, and                            oxygen  saturations were monitored continuously. The                            GIF-H190 ([id number]) scope was introduced through the                            mouth, and advanced to the second part of duodenum.                            The upper GI endoscopy was accomplished without                            difficulty. The patient tolerated the procedure                            well. Scope In: 9:32:03 AM Scope Out: 9:40:22 AM Total Procedure Duration: 0 hours 8 minutes 19 seconds  Findings:      Tubular esophagus patent throughout its course. "Ratty" undulating       Z-line. No Barrett's epithelium seen. No nodularity. No esophagitis.      A small hiatal hernia was present. Diffuse snake skinning or fish scale       appearance of the gastric mucosa. Streaky linear erythema erosions in       the antrum. No ulcer or infiltrating process. Pylorus patent.      The duodenal bulb and second portion of the duodenum were normal. Scope       was withdrawn, a 56 [place] Maloney dilators passed to full insertion       easily without resistance. Subsequently, a 87 French Maloney dilators       passed to full insertion with mild resistance. A look back revealed no       mucosal disruption. There was no blood or complication noted associated       with this maneuver. Finally, the gastric antrum and body were biopsied       for histologic study. Estimated blood loss was minimal. Impression:               - Normal-appearing esophagus (undulating Z-line).                            Status post [facility] dilation. Small hiatal  hernia.                            Abnormal gastric mucosa?"status post biopsy.                           - Normal duodenal bulb and second portion of the                            duodenum. Moderate Sedation:      Moderate (conscious) sedation was personally administered by an       anesthesia professional. The following parameters were monitored: oxygen       saturation, heart  rate, blood pressure, respiratory rate, EKG, adequacy       of pulmonary ventilation, and response to care. Recommendation:           - Patient has a contact number available for                            emergencies. The signs and symptoms of potential                            delayed complications were discussed with the                            patient. Return to normal activities tomorrow.                            Written discharge instructions were provided to the                            patient.                           - Advance diet as tolerated.                           - Continue present medications.                           - Return to my office in 3 months. Follow-up on                            pathology. Procedure Code(s):        --- Professional ---                           [phone], Esophagogastroduodenoscopy, flexible,                            transoral; with biopsy, single or multiple Diagnosis Code(s):        --- Professional ---                           K44.9, Diaphragmatic hernia without obstruction or  gangrene                           R13.10, Dysphagia, unspecified CPT copyright 2022 American Medical Association. All rights reserved. The codes documented in this report are preliminary and upon coder review may  be revised to meet current compliance requirements. [name]. [name], MD Norvel Richards, MD 05/20/2022 9:54:40 AM This report has been signed electronically. Number of Addenda: 0

## 2022-05-20 NOTE — Anesthesia Postprocedure Evaluation (Signed)
Anesthesia Post Note  Patient: [name]  Procedure(s) Performed: ESOPHAGOGASTRODUODENOSCOPY (EGD) WITH PROPOFOL [place]  Patient location during evaluation: Phase II Anesthesia Type: General Level of consciousness: awake and alert Pain management: pain level controlled Vital Signs Assessment: post-procedure vital signs reviewed and stable Respiratory status: spontaneous breathing, nonlabored ventilation, respiratory function stable and patient connected to nasal cannula oxygen Cardiovascular status: blood pressure returned to baseline and stable Postop Assessment: no apparent nausea or vomiting Anesthetic complications: no   No notable events documented.   Last Vitals:  Vitals:   05/20/22 0823 05/20/22 0950  BP: (!) 165/89 (!) 139/50  Pulse:  62  Resp:  16  Temp:    SpO2:  95%    Last Pain:  Vitals:   05/20/22 0950  TempSrc:   PainSc: 0-No pain                 [name] Clyde Canterbury

## 2022-05-20 NOTE — H&P (Signed)
$'@LOGO'H$ @   Primary Care Physician:  Michell Heinrich, DO Primary Gastroenterologist:  Dr. Gala Romney  Pre-Procedure History & Physical: HPI:  [name] is a 81 y.o. male here for further evaluation of esophageal dysphagia in the setting background setting of GERD.  Prior concern for short segment Barrett's esophagus not borne out on histology from 2020 EGD.  GERD well-controlled on omeprazole 40 mg daily.  Past Medical History:  Diagnosis Date   Arthritis    "fingers; knees; toes" (01/05/2014)   Barrett's esophagus    BPH (benign prostatic hyperplasia)    Bradycardia 2009   Sinus rhythm in the 40s without symptoms;   GERD (gastroesophageal reflux disease)    ulcerative/erosive   Gout    Hemorrhoids    Hiatal hernia    History of skin cancer    Hx of adenomatous colonic polyps    Hyperlipidemia    Hypertension    Hypothyroidism    Following partial thyroidectomy   Squamous carcinoma    skin cancer benign arms and right ear and rightside lip   Tobacco abuse, in remission    10-20 pack years; discontinued in 1981    Past Surgical History:  Procedure Laterality Date   ABDOMINAL HERNIA REPAIR  1990's   BIOPSY  04/13/2018   Procedure: BIOPSY;  Surgeon: Daneil Dolin, MD;  [facility];  Service: Endoscopy;;   BIOPSY  06/27/2019   Procedure: BIOPSY;  Surgeon: Daneil Dolin, MD;  [facility]: [facility];  Service: Endoscopy;;  Distal Esophagus biopsy   CATARACT EXTRACTION W/ INTRAOCULAR LENS  IMPLANT, BILATERAL Bilateral 2012-2014   COLONOSCOPY  12/2005   normal   COLONOSCOPY  2004   tubulovillous adenoma of rectum    COLONOSCOPY  01/12/2011   Dr. Gala Romney- normal rectum   COLONOSCOPY N/A 07/09/2015   Procedure: COLONOSCOPY;  Surgeon: Daneil Dolin, MD;  [facility];  Service: Endoscopy;  Laterality: N/A;  730    COLONOSCOPY N/A 01/04/2019   Procedure: COLONOSCOPY;  Surgeon: Daneil Dolin, MD;  [facility];  Service: Endoscopy;  Laterality:  N/A;  10:45am   ELBOW ARTHROSCOPY Left 2014   ESOPHAGEAL DILATION N/A 02/13/2015   Procedure: ESOPHAGEAL DILATION;  Surgeon: Daneil Dolin, MD;  [facility];  Service: Endoscopy;  Laterality: N/A;   ESOPHAGOGASTRODUODENOSCOPY  01/2006   geographic erosive RE, noncritical peptic stricture s/p dilation, antral erosion   ESOPHAGOGASTRODUODENOSCOPY  01/12/2011   Dr. Gala Romney- hiatal hernia, distal esophageal erosions, barretts esophagus   ESOPHAGOGASTRODUODENOSCOPY  01/27/2012   Dr.Talisha Erby- short segment barretts esophagus, erosive reflux esophagitis. bx= barretts esophagus, negative for dysplasia   ESOPHAGOGASTRODUODENOSCOPY N/A 02/13/2015   Dr.Elvan Ebron- ulcerative/erosive reflux esophagitis, abnormal esophagitis c/w prior diagnosis of barretts esophagus, hiatal hernia. bx=barretts esophagus   ESOPHAGOGASTRODUODENOSCOPY N/A 04/13/2018   Procedure: ESOPHAGOGASTRODUODENOSCOPY (EGD);  Surgeon: Daneil Dolin, MD;  [facility];  Service: Endoscopy;  Laterality: N/A;  2:15pm   ESOPHAGOGASTRODUODENOSCOPY N/A 06/27/2019   salmon-colored mucosa of esophagus with biopsy negative for Barrett's. Biopsy in 2019 consitent with Barrett's. normal duodenal bulb, small hiatal hernia.   HEMORRHOID SURGERY N/A 07/12/2015   Procedure: SIMPLE HEMORRHOIDECTOMY;  Surgeon: Aviva Signs, MD;  Location: [facility];  Service: General;  Laterality: N/A;   HERNIA REPAIR     MALONEY DILATION  01/12/2011   Procedure: [name] DILATION;  Surgeon: Daneil Dolin, MD;  [facility];  Service: Endoscopy;  Laterality: N/A;  56 french   MALONEY DILATION  N/A 04/13/2018   Procedure: [name] DILATION;  Surgeon: Daneil Dolin, MD;  [facility];  Service: Endoscopy;  Laterality: N/A;   MALONEY DILATION N/A 06/27/2019   Procedure: [name] DILATION;  Surgeon: Daneil Dolin, MD;  [facility];  Service: Endoscopy;  Laterality: N/A;   SQUAMOUS CELL CARCINOMA EXCISION     "cut a piece of  skin, off my hands; left arm; under right lip; right ear" (01/05/2014)   THYROIDECTOMY, PARTIAL Right 1969   lobe-nodule present   TOTAL KNEE ARTHROPLASTY Left 01/05/2014   Procedure: LEFT TOTAL KNEE ARTHROPLASTY;  Surgeon: Mcarthur Rossetti, MD;  Location: [place];  Service: Orthopedics;  Laterality: Left;   TOTAL KNEE ARTHROPLASTY Right 05/04/2014   Procedure: RIGHT TOTAL KNEE ARTHROPLASTY;  Surgeon: Mcarthur Rossetti, MD;  Location: [facility];  Service: Orthopedics;  Laterality: Right;   UMBILICAL HERNIA REPAIR  2000   WISDOM TOOTH EXTRACTION  06/19/2020    Prior to Admission medications   Medication Sig Start Date End Date Taking? Authorizing Provider  allopurinol (ZYLOPRIM) 100 MG tablet Take 100 mg by mouth daily as needed (gout). 05/04/20  Yes [provider]  atorvastatin (LIPITOR) 20 MG tablet TAKE 1 TABLET BY MOUTH EVERYDAY AT BEDTIME 05/12/22  Yes Tolia, Sunit, DO  chlorthalidone (HYGROTON) 25 MG tablet TAKE 1 TABLET BY MOUTH EVERY DAY IN THE MORNING 03/02/22  Yes Tolia, Sunit, DO  Cholecalciferol (VITAMIN D3) 125 MCG (5000 UT) CAPS Take 5,000 Units by mouth every evening.   Yes [provider]  levothyroxine (SYNTHROID) 150 MCG tablet Take 1 tablet (150 mcg total) by mouth daily before breakfast. 12/09/20  Yes Reardon, Juanetta Beets, NP  omeprazole (PRILOSEC) 40 MG capsule TAKE 1 CAPSULE BY MOUTH EVERY DAY Patient taking differently: Take 40 mg by mouth every evening. 08/08/21  Yes Mahala Menghini, PA-C  tamsulosin (FLOMAX) 0.4 MG CAPS capsule Take 0.4 mg by mouth at bedtime.  01/15/15  Yes [provider]  valsartan (DIOVAN) 160 MG tablet Take 1 tablet (160 mg total) by mouth every morning. 04/03/22 07/02/22 Yes Tolia, Sunit, DO  vitamin C (ASCORBIC ACID) 500 MG tablet Take 500 mg by mouth at bedtime.   Yes [provider]    Allergies as of 04/15/2022 - Review Complete 04/15/2022  Allergen Reaction Noted   Meloxicam Swelling 06/20/2020    Family  History  Problem Relation Age of Onset   Lung cancer Father 39   Colon polyps Brother    Colon cancer Neg Hx    Liver disease Neg Hx     Social History   Socioeconomic History   Marital status: Married    Spouse name: Not on file   Number of children: 2   Years of education: Not on file   Highest education level: Not on file  Occupational History   Occupation: [employer]: Current employment   Occupation: Engineer, civil (consulting)    Comment: Prior employment  Tobacco Use   Smoking status: Former    Packs/day: 1.00    Years: 20.00    Total pack years: 20.00    Types: Cigarettes    Start date: 07/06/1956    Quit date: 07/06/1980    Years since quitting: 41.8    Passive exposure: Past   Smokeless tobacco: Never   Tobacco comments:    "quit smoking in ~ 1982"  Vaping Use   Vaping Use: Never used  Substance and Sexual Activity   Alcohol  use: Not Currently    Comment: occ beer   Drug use: No   Sexual activity: Yes    Partners: Female    Comment: spouse  Other Topics Concern   Not on file  Social History Narrative   Not on file   Social Determinants of Health   Financial Resource Strain: Not on file  Food Insecurity: Not on file  Transportation Needs: Not on file  Physical Activity: Not on file  Stress: Not on file  Social Connections: Not on file  Intimate Partner Violence: Not on file    Review of Systems: See HPI, otherwise negative ROS  Physical Exam: BP (!) 165/89   Pulse (!) 50   Temp 97.8 F (36.6 C) (Oral)   Resp 16   Ht '5\' 10"'$  (1.778 m)   Wt 122.5 kg   SpO2 100%   BMI 38.75 kg/m  General:   Alert,  Well-developed, well-nourished, pleasant and cooperative in NAD Neck:  Supple; no masses or thyromegaly. No significant cervical adenopathy. Lungs:  Clear throughout to auscultation.   No wheezes, crackles, or rhonchi. No acute distress. Heart:  Regular rate and rhythm; no murmurs, clicks, rubs,  or gallops. Abdomen: Non-distended, normal  bowel sounds.  Soft and nontender without appreciable mass or hepatosplenomegaly.  Pulses:  Normal pulses noted. Extremities:  Without clubbing or edema.  Impression/Plan: 81 year old gentleman with longstanding GERD.  Question of short segment Barrett's esophagus previously noted but biopsies -2020.  Here for further evaluation of esophageal dysphagia.  I have offered patient a diagnostic EGD.  The risks, benefits, limitations, alternatives and imponderables have been reviewed with the patient. Potential for esophageal dilation, biopsy, etc. have also been reviewed.  Questions have been answered. All parties agreeable.      Notice: This dictation was prepared with Dragon dictation along with smaller phrase technology. Any transcriptional errors that result from this process are unintentional and may not be corrected upon review.

## 2022-05-21 LAB — SURGICAL PATHOLOGY

## 2022-05-22 ENCOUNTER — Encounter: Payer: Self-pay | Admitting: Internal Medicine

## 2022-05-27 ENCOUNTER — Encounter (HOSPITAL_COMMUNITY): Payer: Self-pay | Admitting: Internal Medicine

## 2022-06-01 ENCOUNTER — Other Ambulatory Visit: Payer: Self-pay | Admitting: Cardiology

## 2022-06-01 DIAGNOSIS — I10 Essential (primary) hypertension: Secondary | ICD-10-CM

## 2022-06-01 DIAGNOSIS — R0609 Other forms of dyspnea: Secondary | ICD-10-CM

## 2022-07-30 ENCOUNTER — Other Ambulatory Visit: Payer: Self-pay | Admitting: Cardiology

## 2022-07-30 DIAGNOSIS — E782 Mixed hyperlipidemia: Secondary | ICD-10-CM

## 2022-08-03 ENCOUNTER — Other Ambulatory Visit: Payer: Self-pay | Admitting: Cardiology

## 2022-08-03 DIAGNOSIS — I5032 Chronic diastolic (congestive) heart failure: Secondary | ICD-10-CM

## 2022-08-11 ENCOUNTER — Ambulatory Visit: Payer: Medicare Other | Admitting: Internal Medicine

## 2022-09-15 ENCOUNTER — Ambulatory Visit (INDEPENDENT_AMBULATORY_CARE_PROVIDER_SITE_OTHER): Payer: Medicare Other | Admitting: Internal Medicine

## 2022-09-15 ENCOUNTER — Encounter: Payer: Self-pay | Admitting: Internal Medicine

## 2022-09-15 VITALS — BP 157/71 | HR 74 | Temp 97.6°F | Ht 70.0 in | Wt 284.8 lb

## 2022-09-15 DIAGNOSIS — R151 Fecal smearing: Secondary | ICD-10-CM | POA: Diagnosis not present

## 2022-09-15 DIAGNOSIS — D126 Benign neoplasm of colon, unspecified: Secondary | ICD-10-CM

## 2022-09-15 DIAGNOSIS — K2270 Barrett's esophagus without dysplasia: Secondary | ICD-10-CM

## 2022-09-15 DIAGNOSIS — K2100 Gastro-esophageal reflux disease with esophagitis, without bleeding: Secondary | ICD-10-CM | POA: Diagnosis not present

## 2022-09-15 MED ORDER — OMEPRAZOLE 40 MG PO CPDR: 40.0000 mg | DELAYED_RELEASE_CAPSULE | Freq: Every day | ORAL | 3 refills | Status: DC

## 2022-09-15 NOTE — Progress Notes (Signed)
Primary Care Physician:  Michell Heinrich, DO Primary Gastroenterologist:  Dr. Gala Romney  Pre-Procedure History & Physical: HPI:  [name] is a 82 y.o. male here for fecal seepage.  Had 2 hemorrhoid columns banded.  No improvement in seepage.  He tells me as a matter routine, he  has 1 bowel movement every day in the morning.  About 2 to 3 hours later he feels he needs to clean himself  - he has some seepage.  He does not really have any other bowel movement for the rest of the day.  No bleeding.  He tells me he needs a fiber fortified diet but is not taking any supplementation beyond a fiber gummy daily. No dysphagia -  status post Maloney dilation.  Undulating Z-line at prior EGD.  No Barrett's on biopsies.  No future colonoscopy or EGD recommended  Past Medical History:  Diagnosis Date   Arthritis    "fingers; knees; toes" (01/05/2014)   Barrett's esophagus    BPH (benign prostatic hyperplasia)    Bradycardia 2009   Sinus rhythm in the 40s without symptoms;   GERD (gastroesophageal reflux disease)    ulcerative/erosive   Gout    Hemorrhoids    Hiatal hernia    History of skin cancer    Hx of adenomatous colonic polyps    Hyperlipidemia    Hypertension    Hypothyroidism    Following partial thyroidectomy   Squamous carcinoma    skin cancer benign arms and right ear and rightside lip   Tobacco abuse, in remission    10-20 pack years; discontinued in 1981    Past Surgical History:  Procedure Laterality Date   ABDOMINAL HERNIA REPAIR  1990's   BIOPSY  04/13/2018   Procedure: BIOPSY;  Surgeon: Daneil Dolin, MD;  [facility];  Service: Endoscopy;;   BIOPSY  06/27/2019   Procedure: BIOPSY;  Surgeon: Daneil Dolin, MD;  [facility];  Service: Endoscopy;;  Distal Esophagus biopsy   BIOPSY  05/20/2022   Procedure: BIOPSY;  Surgeon: Daneil Dolin, MD;  [facility];  Service: Endoscopy;;   CATARACT EXTRACTION W/ INTRAOCULAR LENS  IMPLANT,  BILATERAL Bilateral 2012-2014   COLONOSCOPY  12/2005   normal   COLONOSCOPY  2004   tubulovillous adenoma of rectum    COLONOSCOPY  01/12/2011   Dr. Gala Romney- normal rectum   COLONOSCOPY N/A 07/09/2015   Procedure: COLONOSCOPY;  Surgeon: Daneil Dolin, MD;  [facility];  Service: Endoscopy;  Laterality: N/A;  730    COLONOSCOPY N/A 01/04/2019   Procedure: COLONOSCOPY;  Surgeon: Daneil Dolin, MD;  [facility];  Service: Endoscopy;  Laterality: N/A;  10:45am   ELBOW ARTHROSCOPY Left 2014   ESOPHAGEAL DILATION N/A 02/13/2015   Procedure: ESOPHAGEAL DILATION;  Surgeon: Daneil Dolin, MD;  [facility];  Service: Endoscopy;  Laterality: N/A;   ESOPHAGOGASTRODUODENOSCOPY  01/2006   geographic erosive RE, noncritical peptic stricture s/p dilation, antral erosion   ESOPHAGOGASTRODUODENOSCOPY  01/12/2011   Dr. Gala Romney- hiatal hernia, distal esophageal erosions, barretts esophagus   ESOPHAGOGASTRODUODENOSCOPY  01/27/2012   Dr.Arthea Nobel- short segment barretts esophagus, erosive reflux esophagitis. bx= barretts esophagus, negative for dysplasia   ESOPHAGOGASTRODUODENOSCOPY N/A 02/13/2015   Dr.Alizee Maple- ulcerative/erosive reflux esophagitis, abnormal esophagitis c/w prior diagnosis of barretts esophagus, hiatal hernia. bx=barretts esophagus   ESOPHAGOGASTRODUODENOSCOPY N/A 04/13/2018   Procedure: ESOPHAGOGASTRODUODENOSCOPY (EGD);  Surgeon: Daneil Dolin, MD;  [facility];  Service: Endoscopy;  Laterality: N/A;  2:15pm   ESOPHAGOGASTRODUODENOSCOPY N/A 06/27/2019   salmon-colored mucosa of esophagus with biopsy negative for Barrett's. Biopsy in 2019 consitent with Barrett's. normal duodenal bulb, small hiatal hernia.   ESOPHAGOGASTRODUODENOSCOPY (EGD) WITH PROPOFOL N/A 05/20/2022   Procedure: ESOPHAGOGASTRODUODENOSCOPY (EGD) WITH PROPOFOL;  Surgeon: Daneil Dolin, MD;  [facility];  Service: Endoscopy;  Laterality: N/A;  9:15am, asa 3    HEMORRHOID SURGERY N/A 07/12/2015   Procedure: SIMPLE HEMORRHOIDECTOMY;  Surgeon: Aviva Signs, MD;  Location: [facility];  Service: General;  Laterality: N/A;   HERNIA REPAIR     MALONEY DILATION  01/12/2011   Procedure: [name] DILATION;  Surgeon: Daneil Dolin, MD;  [facility];  Service: Endoscopy;  Laterality: N/A;  56 french   [place] N/A 04/13/2018   Procedure: MALONEY DILATION;  Surgeon: Daneil Dolin, MD;  [facility];  Service: Endoscopy;  Laterality: N/A;   MALONEY DILATION N/A 06/27/2019   Procedure: [name] DILATION;  Surgeon: Daneil Dolin, MD;  [facility];  Service: Endoscopy;  Laterality: N/A;   MALONEY DILATION N/A 05/20/2022   Procedure: [name] DILATION;  Surgeon: Daneil Dolin, MD;  [facility];  Service: Endoscopy;  Laterality: N/A;   SQUAMOUS CELL CARCINOMA EXCISION     "cut a piece of skin, off my hands; left arm; under right lip; right ear" (01/05/2014)   THYROIDECTOMY, PARTIAL Right 1969   lobe-nodule present   TOTAL KNEE ARTHROPLASTY Left 01/05/2014   Procedure: LEFT TOTAL KNEE ARTHROPLASTY;  Surgeon: Mcarthur Rossetti, MD;  Location: [place];  Service: Orthopedics;  Laterality: Left;   TOTAL KNEE ARTHROPLASTY Right 05/04/2014   Procedure: RIGHT TOTAL KNEE ARTHROPLASTY;  Surgeon: Mcarthur Rossetti, MD;  [facility];  Service: Orthopedics;  Laterality: Right;   UMBILICAL HERNIA REPAIR  2000   WISDOM TOOTH EXTRACTION  06/19/2020    Prior to Admission medications   Medication Sig Start Date End Date Taking? Authorizing Provider  allopurinol (ZYLOPRIM) 100 MG tablet Take 100 mg by mouth daily as needed (gout). 05/04/20  Yes [provider]  atorvastatin (LIPITOR) 20 MG tablet TAKE 1 TABLET BY MOUTH EVERYDAY AT BEDTIME 07/30/22  Yes Tolia, Sunit, DO  Cholecalciferol (VITAMIN D3) 125 MCG (5000 UT) CAPS Take 5,000 Units by mouth every evening.   Yes [provider]  hydrALAZINE  (APRESOLINE) 100 MG tablet TAKE 1 TABLET BY MOUTH THREE TIMES A DAY FOR 90 DAYS   Yes [provider]  levothyroxine (SYNTHROID) 150 MCG tablet Take 1 tablet (150 mcg total) by mouth daily before breakfast. 12/09/20  Yes Reardon, Juanetta Beets, NP  omeprazole (PRILOSEC) 40 MG capsule TAKE 1 CAPSULE BY MOUTH EVERY DAY Patient taking differently: Take 40 mg by mouth every evening. 08/08/21  Yes Mahala Menghini, PA-C  tamsulosin (FLOMAX) 0.4 MG CAPS capsule Take 0.4 mg by mouth at bedtime.  01/15/15  Yes [provider]  valsartan (DIOVAN) 160 MG tablet Take 1 tablet (160 mg total) by mouth every morning. 04/03/22 09/15/22 Yes Tolia, Sunit, DO  vitamin C (ASCORBIC ACID) 500 MG tablet Take 500 mg by mouth at bedtime.   Yes [provider]    Allergies as of 09/15/2022 - Review Complete 09/15/2022  Allergen Reaction Noted   Mobic [meloxicam] Swelling 06/20/2020    Family History  Problem Relation Age of Onset   Lung cancer Father 69   Colon polyps Brother  Colon cancer Neg Hx    Liver disease Neg Hx     Social History   Socioeconomic History   Marital status: Married    Spouse name: Not on file   Number of children: 2   Years of education: Not on file   Highest education level: Not on file  Occupational History   Occupation: [employer]: Current employment   Occupation: Engineer, civil (consulting)    Comment: Prior employment  Tobacco Use   Smoking status: Former    Packs/day: 1.00    Years: 20.00    Total pack years: 20.00    Types: Cigarettes    Start date: 07/06/1956    Quit date: 07/06/1980    Years since quitting: 42.2    Passive exposure: Past   Smokeless tobacco: Never   Tobacco comments:    "quit smoking in ~ 1982"  Vaping Use   Vaping Use: Never used  Substance and Sexual Activity   Alcohol use: Not Currently    Comment: occ beer   Drug use: No   Sexual activity: Yes    Partners: Female    Comment: spouse  Other Topics Concern   Not  on file  Social History Narrative   Not on file   Social Determinants of Health   Financial Resource Strain: Not on file  Food Insecurity: Not on file  Transportation Needs: Not on file  Physical Activity: Not on file  Stress: Not on file  Social Connections: Not on file  Intimate Partner Violence: Not on file    Review of Systems: See HPI, otherwise negative ROS  Physical Exam: BP (!) 158/79 (BP Location: Right Arm, Patient Position: Sitting, Cuff Size: Large)   Pulse 66   Temp 97.6 F (36.4 C) (Oral)   Ht '5\' 10"'$  (1.778 m)   Wt 284 lb 12.8 oz (129.2 kg)   SpO2 95%   BMI 40.86 kg/m  General:   Alert,  pleasant and cooperative in NAD  Impression/Plan: 82 year old gentleman with a GERD fairly well-controlled with omeprazole at bedtime.  Dysphagia resolved  - status post empiric passage of a Maloney dilator.  He needs to optimize timing of omeprazole dosing.  Patient has regular fecal seepage about 2 hours to 3 hours after having his morning bowel movement.  This is likely due to sphincteric issues and possibly lack of adequate fiber.  No diarrhea or gross fecal incontinence.  Would like to bolster his fiber intake and see if that helps prior to pursuing a resin binder therapy.  Recommendations:  As discussed, change Prilosec/ omeprazole time of dosing to 30 minutes before supper instead of at bedtime.  Taking it this way will work better .  For your fecal seepage, begin Benefiber 1 tablespoon daily for 3 weeks; thereafter, take 2 tablespoons every day.  Call in 1 month for progress report.  Plan to see back in the office in 3 months.   Notice: This dictation was prepared with Dragon dictation along with smaller phrase technology. Any transcriptional errors that result from this process are unintentional and may not be corrected upon review.

## 2022-09-15 NOTE — Patient Instructions (Signed)
It was good to see you again today!  As discussed, change your Prilosec omeprazole time of dosing to 30 minutes before supper instead of at bedtime.  Taking it this way will work better for you over the next 24 hours every day  For your fecal seepage, begin Benefiber 1 tablespoon daily for 3 weeks; thereafter, take 2 tablespoons every day.  Call me in 1 month and let me know how things are going.  We may adjust just the dosing at that time.  Plan to see back in the office in 3 months.

## 2022-09-29 ENCOUNTER — Other Ambulatory Visit: Payer: Self-pay | Admitting: Cardiology

## 2022-09-29 ENCOUNTER — Ambulatory Visit: Payer: Medicare Other | Admitting: Cardiology

## 2022-09-29 ENCOUNTER — Encounter: Payer: Self-pay | Admitting: Cardiology

## 2022-09-29 VITALS — BP 165/86 | HR 70 | Resp 18 | Ht 70.0 in | Wt 283.4 lb

## 2022-09-29 DIAGNOSIS — I493 Ventricular premature depolarization: Secondary | ICD-10-CM

## 2022-09-29 DIAGNOSIS — I5032 Chronic diastolic (congestive) heart failure: Secondary | ICD-10-CM

## 2022-09-29 DIAGNOSIS — R0609 Other forms of dyspnea: Secondary | ICD-10-CM

## 2022-09-29 DIAGNOSIS — E782 Mixed hyperlipidemia: Secondary | ICD-10-CM

## 2022-09-29 DIAGNOSIS — I10 Essential (primary) hypertension: Secondary | ICD-10-CM

## 2022-09-29 DIAGNOSIS — R001 Bradycardia, unspecified: Secondary | ICD-10-CM

## 2022-09-29 MED ORDER — ISOSORB DINITRATE-HYDRALAZINE 20-37.5 MG PO TABS: 1.0000 | ORAL_TABLET | Freq: Three times a day (TID) | ORAL | 0 refills | Status: DC

## 2022-09-29 NOTE — Progress Notes (Signed)
ID:  [name], DOB 09-Apr-1941, MRN [id number]  PCP:  Michell Heinrich, DO  Cardiologist:  Rex Kras, DO, [facility] (established care 12/05/2021)  Date: 09/29/22 Last Office Visit: 04/03/2022  Chief Complaint  Patient presents with   Congestive Heart Failure   Shortness of Breath   Follow-up    6 months    HPI  [name] is a 82 y.o. Caucasian male whose past medical history and cardiovascular risk factors include: Chronic HFpEF, hypertension, hyperlipidemia, history of COVID-19 infection, BPH, obesity due to excess calories, hypothyroidism, advanced age  Patient is being followed by the practice for HFpEF and is here for 26-month follow-up visit.  He denies anginal chest pain but has been experiencing more dyspnea on exertion.  He has undergone appropriate cardiovascular testing in the recent past but is still very concerned with regards to having a blockage.  He is also gained 15 pounds due to dietary indiscretion.  Home blood pressure log reviewed.  Home blood pressures illustrate the morning numbers to be elevated when compared to evening.  His valsartan 160 mg p.o. daily was transitioned to valsartan/HCTZ 320/12.5 mg p.o. every morning by his PCP.  In the past Delene Loll was cost prohibitive.   He was also started on amlodipine by his PCP and subsequently has noted lower extremity swelling.  Sleep referral was provided during multiple prior office visits but this is still pending.   FUNCTIONAL STATUS: Still does his activities of daily living without any limitations.  But no structured exercise program or daily routine.  ALLERGIES: Allergies  Allergen Reactions   Mobic [Meloxicam] Swelling    Face and tongue swelling      MEDICATION LIST PRIOR TO VISIT: Current Meds  Medication Sig   allopurinol (ZYLOPRIM) 100 MG tablet Take 100 mg by mouth daily as needed (gout).   amLODipine (NORVASC) 10 MG tablet Take 1 tablet by mouth daily.   atorvastatin (LIPITOR) 20 MG  tablet TAKE 1 TABLET BY MOUTH EVERYDAY AT BEDTIME   Cholecalciferol (VITAMIN D3) 125 MCG (5000 UT) CAPS Take 5,000 Units by mouth every evening.   isosorbide-hydrALAZINE (BIDIL) 20-37.5 MG tablet Take 1 tablet by mouth 3 (three) times daily.   levothyroxine (SYNTHROID) 150 MCG tablet Take 1 tablet (150 mcg total) by mouth daily before breakfast.   omeprazole (PRILOSEC) 40 MG capsule Take 1 capsule (40 mg total) by mouth daily. TAKE 1 CAPSULE BY MOUTH EVERY DAY   tamsulosin (FLOMAX) 0.4 MG CAPS capsule Take 0.4 mg by mouth at bedtime.    valsartan-hydrochlorothiazide (DIOVAN-HCT) 320-12.5 MG tablet Take 1 tablet by mouth daily.   vitamin C (ASCORBIC ACID) 500 MG tablet Take 500 mg by mouth at bedtime.   [DISCONTINUED] hydrALAZINE (APRESOLINE) 100 MG tablet TAKE 1 TABLET BY MOUTH THREE TIMES A DAY FOR 90 DAYS   [DISCONTINUED] valsartan (DIOVAN) 160 MG tablet Take 1 tablet (160 mg total) by mouth every morning.     PAST MEDICAL HISTORY: Past Medical History:  Diagnosis Date   Arthritis    "fingers; knees; toes" (01/05/2014)   Barrett's esophagus    BPH (benign prostatic hyperplasia)    Bradycardia 2009   Sinus rhythm in the 40s without symptoms;   GERD (gastroesophageal reflux disease)    ulcerative/erosive   Gout    Hemorrhoids    Hiatal hernia    History of skin cancer    Hx of adenomatous colonic polyps    Hyperlipidemia    Hypertension    Hypothyroidism  Following partial thyroidectomy   Squamous carcinoma    skin cancer benign arms and right ear and rightside lip   Tobacco abuse, in remission    10-20 pack years; discontinued in 1981    PAST SURGICAL HISTORY: Past Surgical History:  Procedure Laterality Date   ABDOMINAL HERNIA REPAIR  1990's   BIOPSY  04/13/2018   Procedure: BIOPSY;  Surgeon: Daneil Dolin, MD;  [facility];  Service: Endoscopy;;   BIOPSY  06/27/2019   Procedure: BIOPSY;  Surgeon: Daneil Dolin, MD;  [facility];  Service:  Endoscopy;;  Distal Esophagus biopsy   BIOPSY  05/20/2022   Procedure: BIOPSY;  Surgeon: Daneil Dolin, MD;  [facility];  Service: Endoscopy;;   CATARACT EXTRACTION W/ INTRAOCULAR LENS  IMPLANT, BILATERAL Bilateral 2012-2014   COLONOSCOPY  12/2005   normal   COLONOSCOPY  2004   tubulovillous adenoma of rectum    COLONOSCOPY  01/12/2011   Dr. Gala Romney- normal rectum   COLONOSCOPY N/A 07/09/2015   Procedure: COLONOSCOPY;  Surgeon: Daneil Dolin, MD;  [facility];  Service: Endoscopy;  Laterality: N/A;  730    COLONOSCOPY N/A 01/04/2019   Procedure: COLONOSCOPY;  Surgeon: Daneil Dolin, MD;  [facility];  Service: Endoscopy;  Laterality: N/A;  10:45am   ELBOW ARTHROSCOPY Left 2014   ESOPHAGEAL DILATION N/A 02/13/2015   Procedure: ESOPHAGEAL DILATION;  Surgeon: Daneil Dolin, MD;  [facility];  Service: Endoscopy;  Laterality: N/A;   ESOPHAGOGASTRODUODENOSCOPY  01/2006   geographic erosive RE, noncritical peptic stricture s/p dilation, antral erosion   ESOPHAGOGASTRODUODENOSCOPY  01/12/2011   Dr. Gala Romney- hiatal hernia, distal esophageal erosions, barretts esophagus   ESOPHAGOGASTRODUODENOSCOPY  01/27/2012   Dr.Rourk- short segment barretts esophagus, erosive reflux esophagitis. bx= barretts esophagus, negative for dysplasia   ESOPHAGOGASTRODUODENOSCOPY N/A 02/13/2015   Dr.Rourk- ulcerative/erosive reflux esophagitis, abnormal esophagitis c/w prior diagnosis of barretts esophagus, hiatal hernia. bx=barretts esophagus   ESOPHAGOGASTRODUODENOSCOPY N/A 04/13/2018   Procedure: ESOPHAGOGASTRODUODENOSCOPY (EGD);  Surgeon: Daneil Dolin, MD;  [facility];  Service: Endoscopy;  Laterality: N/A;  2:15pm   ESOPHAGOGASTRODUODENOSCOPY N/A 06/27/2019   salmon-colored mucosa of esophagus with biopsy negative for Barrett's. Biopsy in 2019 consitent with Barrett's. normal duodenal bulb, small hiatal hernia.   ESOPHAGOGASTRODUODENOSCOPY (EGD)  WITH PROPOFOL N/A 05/20/2022   Procedure: ESOPHAGOGASTRODUODENOSCOPY (EGD) WITH PROPOFOL;  Surgeon: Daneil Dolin, MD;  [facility];  Service: Endoscopy;  Laterality: N/A;  9:15am, asa 3   HEMORRHOID SURGERY N/A 07/12/2015   Procedure: SIMPLE HEMORRHOIDECTOMY;  Surgeon: Aviva Signs, MD;  Location: [facility];  Service: General;  Laterality: N/A;   HERNIA REPAIR     MALONEY DILATION  01/12/2011   Procedure: [name] DILATION;  Surgeon: Daneil Dolin, MD;  [facility];  Service: Endoscopy;  Laterality: N/A;  56 french   [place] N/A 04/13/2018   Procedure: MALONEY DILATION;  Surgeon: Daneil Dolin, MD;  [facility];  Service: Endoscopy;  Laterality: N/A;   MALONEY DILATION N/A 06/27/2019   Procedure: [name] DILATION;  Surgeon: Daneil Dolin, MD;  [facility];  Service: Endoscopy;  Laterality: N/A;   MALONEY DILATION N/A 05/20/2022   Procedure: [name] DILATION;  Surgeon: Daneil Dolin, MD;  [facility];  Service: Endoscopy;  Laterality: N/A;   SQUAMOUS CELL CARCINOMA EXCISION     "cut a piece of skin, off my hands; left  arm; under right lip; right ear" (01/05/2014)   THYROIDECTOMY, PARTIAL Right 1969   lobe-nodule present   TOTAL KNEE ARTHROPLASTY Left 01/05/2014   Procedure: LEFT TOTAL KNEE ARTHROPLASTY;  Surgeon: Mcarthur Rossetti, MD;  Location: [facility];  Service: Orthopedics;  Laterality: Left;   TOTAL KNEE ARTHROPLASTY Right 05/04/2014   Procedure: RIGHT TOTAL KNEE ARTHROPLASTY;  Surgeon: Mcarthur Rossetti, MD;  Location: [facility];  Service: Orthopedics;  Laterality: Right;   UMBILICAL HERNIA REPAIR  2000   WISDOM TOOTH EXTRACTION  06/19/2020    FAMILY HISTORY: The patient family history includes Colon polyps in his brother; Lung cancer (age of onset: 36) in his father.  SOCIAL HISTORY:  The patient  reports that he quit smoking about 42 years ago. His smoking use included cigarettes. He started smoking  about 66 years ago. He has a 20.00 pack-year smoking history. He has been exposed to tobacco smoke. He has never used smokeless tobacco. He reports that he does not currently use alcohol. He reports that he does not use drugs.  REVIEW OF SYSTEMS: Review of Systems  Cardiovascular:  Positive for dyspnea on exertion. Negative for chest pain, claudication, irregular heartbeat, leg swelling, near-syncope, orthopnea, palpitations, paroxysmal nocturnal dyspnea and syncope.  Respiratory:  Positive for shortness of breath and snoring.   Hematologic/Lymphatic: Negative for bleeding problem.  Musculoskeletal:  Negative for muscle cramps and myalgias.  Neurological:  Negative for dizziness and light-headedness.    PHYSICAL EXAM:    09/29/2022    9:33 AM 09/15/2022   10:10 AM 09/15/2022    9:36 AM  Vitals with BMI  Height 5\' 10"   5\' 10"   Weight 283 lbs 6 oz  284 lbs 13 oz  BMI 123456  123456  Systolic [id number]  Diastolic 86 71 79  Pulse 70 74 66    Physical Exam  Constitutional: No distress.  Age appropriate, hemodynamically stable.   Neck: No JVD present.  Cardiovascular: Normal rate, regular rhythm, S1 normal, S2 normal, intact distal pulses and normal pulses. Exam reveals no gallop, no S3 and no S4.  No murmur heard. Pulses:      Dorsalis pedis pulses are 2+ on the right side and 2+ on the left side.       Posterior tibial pulses are 2+ on the right side and 2+ on the left side.  Pulmonary/Chest: Effort normal and breath sounds normal. No stridor. He has no wheezes. He has no rales.  Abdominal: Soft. Bowel sounds are normal. He exhibits no distension. There is no abdominal tenderness.  Musculoskeletal:        General: No edema.     Cervical back: Neck supple.  Neurological: He is alert and oriented to person, place, and time. He has intact cranial nerves (2-12).  Skin: Skin is warm and moist.    CARDIAC DATABASE: EKG: 09/29/2022: Sinus bradycardia, 50 bpm, normal axis, without  underlying ischemia injury pattern.  Echocardiogram: 09/25/2015: Technically difficult study, LVEF 60 to 123456, grade 1 diastolic impairment, mild MR, moderate LAE, liver cyst 4 x 5.5 cm.  12/18/2021: Left ventricle cavity is normal in size. Mild concentric hypertrophy of the left ventricle. Normal global wall motion. Normal LV systolic function with EF 62%. Doppler evidence of grade I (impaired) diastolic dysfunction, normal LAP.  The aortic root is borderline dilated at 3.9 cm. Left atrial cavity is mildly dilated. Aneurysmal interatrial septum without 2D or color Doppler evidence of shunting. Mild to moderate mitral regurgitation. Mild tricuspid regurgitation. Mild  pulmonic regurgitation. IVC is not seen.  Incidental finding of large echolucent structure in lives, possible cyst. Recommend clinical correlation.   Stress Testing: Lexiscan (with Mod Bruce protocol) Nuclear stress test 03/10/2022 Myocardial perfusion is normal. Low risk study. Overall LV systolic function is normal without regional wall motion abnormalities. Stress LV EF: 58%.  Nondiagnostic ECG stress. The heart rate response was consistent with Regadenoson. The blood pressure response was physiologic. No previous exam available for comparison.  Coronary calcium score 03/05/2022 Left main: 11. LAD 68. LCx 14. RCA 0. PDA: 0. Total coronary calcium score 93, 23rd percentile. Emphysema with small nodules bilaterally measuring 0.4 cm within the right upper lobe.  Heart Catheterization: None  LABORATORY DATA:    Latest Ref Rng & Units 07/10/2015   11:00 AM 05/05/2014    4:55 AM 04/27/2014    9:35 AM  CBC  WBC 4.0 - 10.5 K/uL 8.5  14.2  6.8   Hemoglobin 13.0 - 17.0 g/dL 14.3  11.5  14.0   Hematocrit 39.0 - 52.0 % 42.1  34.8  41.6   Platelets 150 - 400 K/uL 181  193  206        Latest Ref Rng & Units 04/15/2022    9:31 AM 03/05/2022    1:54 PM 01/26/2022   10:03 AM  CMP  Glucose 70 - 99 mg/dL 100  105  94   BUN 8  - 27 mg/dL 19  25  23    Creatinine 0.76 - 1.27 mg/dL 1.33  1.32  1.42   Sodium 134 - 144 mmol/L 144  145  144   Potassium 3.5 - 5.2 mmol/L 4.2  4.2  4.4   Chloride 96 - 106 mmol/L 104  102  102   CO2 20 - 29 mmol/L 27  27  25    Calcium 8.6 - 10.2 mg/dL 9.8  9.6  9.9   Total Protein 6.0 - 8.5 g/dL  6.8    Total Bilirubin 0.0 - 1.2 mg/dL  0.5    Alkaline Phos 44 - 121 IU/L  69    AST 0 - 40 IU/L  20    ALT 0 - 44 IU/L  21      Lipid Panel  Lab Results  Component Value Date   CHOL 144 03/05/2022   HDL 30 (L) 03/05/2022   LDLCALC 81 03/05/2022   LDLDIRECT 86 03/05/2022   TRIG 191 (H) 03/05/2022   CHOLHDL 4.1 11/27/2020   No components found for: "NTPROBNP" Recent Labs    12/05/21 1041  PROBNP 148   No results for input(s): "TSH" in the last 8760 hours.  BMP Recent Labs    01/26/22 1003 03/05/22 1354 04/15/22 0931  NA 144 145* 144  K 4.4 4.2 4.2  CL 102 102 104  CO2 25 27 27   GLUCOSE 94 105* 100*  BUN 23 25 19   CREATININE 1.42* 1.32* 1.33*  CALCIUM 9.9 9.6 9.8    HEMOGLOBIN A1C No results found for: "HGBA1C", "MPG"  External Labs:  Date Collected: 11/11/2021 , information obtained by referring physician BNP 126 Potassium: 4.0 Creatinine 1.15 mg/dL. eGFR: 64 mL/min per 1.73 m Hemoglobin: 13.8 g/dL and hematocrit: 40.3 % Lipid profile: Total cholesterol 168 , triglycerides 190 , HDL 36 , LDL 101 non-HDL 132 AST: 18 , ALT: 19 , alkaline phosphatase: 72  TSH: 3.93    External Labs: Collected: 01/30/2022 provided by the patient. BNP 338 BUN 23, creatinine 1.42. eGFR 50. Sodium 144, potassium 4.4, chloride 102,  bicarb 25  Collected 03/06/2022 provided by the patient Total cholesterol 144, triglycerides 191, HDL 38, LDL 81 Direct LDL 86 BUN 25, creatinine 1.32. Sodium 145, potassium 4.2, chloride 102, bicarb 27. AST 20, ALT 21, alkaline phosphatase 69 Magnesium 1.8  IMPRESSION:    ICD-10-CM   1. Chronic heart failure with preserved ejection fraction  (HFpEF) (HCC)  I50.32 EKG 12-Lead    isosorbide-hydrALAZINE (BIDIL) 20-37.5 MG tablet    CT CORONARY MORPH W/CTA COR W/SCORE W/CA W/CM &/OR WO/CM    Pro b natriuretic peptide (BNP)    Basic metabolic panel    Magnesium    Magnesium    Basic metabolic panel    Pro b natriuretic peptide (BNP)    2. Dyspnea on exertion  R06.09 EKG 12-Lead    CT CORONARY MORPH W/CTA COR W/SCORE W/CA W/CM &/OR WO/CM    3. Bradycardia  R00.1     4. PVC's (premature ventricular contractions)  I49.3 EKG 12-Lead    5. Benign hypertension  I10     6. Mixed hyperlipidemia  E78.2 LDL cholesterol, direct    Lipid Panel With LDL/HDL Ratio    7. Class 3 severe obesity due to excess calories with serious comorbidity and body mass index (BMI) of 40.0 to 44.9 in adult [facility])  E66.01    Z68.41        RECOMMENDATIONS: SRICHARAN TREADAWAY is a 82 y.o. Caucasian male whose past medical history and cardiac risk factors include: HFpEF, hypertension, hyperlipidemia, history of COVID-19 infection, BPH, obesity due to excess calories, hypothyroidism, advanced age.   Chronic heart failure with preserved ejection fraction (HFpEF) (HCC) Stage B, NYHA class II. Echo: Preserved LVEF, grade 1 diastolic impairment, mild to moderate valvular heart disease. Total coronary calcium score: 93, 23rd percentile Stress MPI: Low risk Sleep referral was provided during multiple office visits-this is still pending.  I will have the office reach out to them to arrange this in the coming weeks. In the past Delene Loll was cost prohibitive.  Currently on valsartan/HCTZ. Home blood pressure not well-controlled. Discontinue hydralazine.  Transition to BiDil. Blood work in 1 week after initiation of Bidil and prior to his coronary CTA.  Dyspnea on exertion Worsening. Likely due to 15 pounds of weight gain over the last 6 months.  Home blood pressures are improving but not at goal.  Medication changes as discussed above. Despite undergoing ischemic  workup as outlined above he still concerned for obstructive CAD and wishes to proceed with coronary CTA.  Recommended that he follows up with pulmonary medicine to evaluate for other etiologies of his dyspnea. May possibly have restrictive physiology given the degree of obesity as well.  Bradycardia Discontinue metoprolol 25 mg p.o. daily in the past.  Since than his ventricular rate has improved.  Benign hypertension Not well-controlled. Medication changes as discussed above  Mixed hyperlipidemia Currently on atorvastatin.   He denies myalgia or other side effects. Will repeat fasting lipid profile for further risk stratification.  Class 2 severe obesity due to excess calories with serious comorbidity and body mass index (BMI) of > 40 in adult (HCC) Body mass index is 40.66 kg/m. I reviewed with the patient the importance of diet, regular physical activity/exercise, weight loss.   Patient is educated on increasing physical activity gradually as tolerated.  With the goal of moderate intensity exercise for 30 minutes a day 5 days a week.   FINAL MEDICATION LIST END OF ENCOUNTER: Meds ordered this encounter  Medications   isosorbide-hydrALAZINE (  BIDIL) 20-37.5 MG tablet    Sig: Take 1 tablet by mouth 3 (three) times daily.    Dispense:  90 tablet    Refill:  0    Medications Discontinued During This Encounter  Medication Reason   hydrALAZINE (APRESOLINE) 100 MG tablet    valsartan (DIOVAN) 160 MG tablet Change in therapy     Current Outpatient Medications:    allopurinol (ZYLOPRIM) 100 MG tablet, Take 100 mg by mouth daily as needed (gout)., Disp: , Rfl:    amLODipine (NORVASC) 10 MG tablet, Take 1 tablet by mouth daily., Disp: , Rfl:    atorvastatin (LIPITOR) 20 MG tablet, TAKE 1 TABLET BY MOUTH EVERYDAY AT BEDTIME, Disp: 90 tablet, Rfl: 0   Cholecalciferol (VITAMIN D3) 125 MCG (5000 UT) CAPS, Take 5,000 Units by mouth every evening., Disp: , Rfl:    isosorbide-hydrALAZINE  (BIDIL) 20-37.5 MG tablet, Take 1 tablet by mouth 3 (three) times daily., Disp: 90 tablet, Rfl: 0   levothyroxine (SYNTHROID) 150 MCG tablet, Take 1 tablet (150 mcg total) by mouth daily before breakfast., Disp: 90 tablet, Rfl: 3   omeprazole (PRILOSEC) 40 MG capsule, Take 1 capsule (40 mg total) by mouth daily. TAKE 1 CAPSULE BY MOUTH EVERY DAY, Disp: 90 capsule, Rfl: 3   tamsulosin (FLOMAX) 0.4 MG CAPS capsule, Take 0.4 mg by mouth at bedtime. , Disp: , Rfl:    valsartan-hydrochlorothiazide (DIOVAN-HCT) 320-12.5 MG tablet, Take 1 tablet by mouth daily., Disp: , Rfl:    vitamin C (ASCORBIC ACID) 500 MG tablet, Take 500 mg by mouth at bedtime., Disp: , Rfl:   Orders Placed This Encounter  Procedures   CT CORONARY MORPH W/CTA COR W/SCORE W/CA W/CM &/OR WO/CM   Pro b natriuretic peptide (BNP)   Basic metabolic panel   Magnesium   EKG 12-Lead    There are no Patient Instructions on file for this visit.   --Continue cardiac medications as reconciled in final medication list. --Return in about 7 weeks (around 11/17/2022) for Follow up, Dyspnea. or sooner if needed. --Continue follow-up with your primary care physician regarding the management of your other chronic comorbid conditions.  Patient's questions and concerns were addressed to his satisfaction. He voices understanding of the instructions provided during this encounter.   This note was created using a voice recognition software as a result there may be grammatical errors inadvertently enclosed that do not reflect the nature of this encounter. Every attempt is made to correct such errors.  Rex Kras, [place], [facility]  Pager:  [phone] Office: [phone]

## 2022-09-30 ENCOUNTER — Other Ambulatory Visit: Payer: Self-pay | Admitting: Cardiology

## 2022-09-30 DIAGNOSIS — I5032 Chronic diastolic (congestive) heart failure: Secondary | ICD-10-CM

## 2022-09-30 DIAGNOSIS — R0609 Other forms of dyspnea: Secondary | ICD-10-CM

## 2022-09-30 MED ORDER — METOPROLOL TARTRATE 25 MG PO TABS: 25.0000 mg | ORAL_TABLET | Freq: Two times a day (BID) | ORAL | 0 refills | Status: DC

## 2022-10-02 ENCOUNTER — Ambulatory Visit: Payer: PRIVATE HEALTH INSURANCE | Admitting: Cardiology

## 2022-10-02 LAB — BASIC METABOLIC PANEL
BUN/Creatinine Ratio: 17 (ref 10–24)
BUN: 23 mg/dL (ref 8–27)
CO2: 24 mmol/L (ref 20–29)
Calcium: 9.8 mg/dL (ref 8.6–10.2)
Chloride: 103 mmol/L (ref 96–106)
Creatinine, Ser: 1.39 mg/dL — ABNORMAL HIGH (ref 0.76–1.27)
Glucose: 95 mg/dL (ref 70–99)
Potassium: 4.2 mmol/L (ref 3.5–5.2)
Sodium: 143 mmol/L (ref 134–144)
eGFR: 51 mL/min/{1.73_m2} — ABNORMAL LOW (ref >59)

## 2022-10-02 LAB — LIPID PANEL WITH LDL/HDL RATIO
Cholesterol, Total: 139 mg/dL (ref 100–199)
HDL: 31 mg/dL — ABNORMAL LOW (ref >39)
LDL Chol Calc (NIH): 87 mg/dL (ref 0–99)
LDL/HDL Ratio: 2.8 ratio (ref 0.0–3.6)
Triglycerides: 113 mg/dL (ref 0–149)
VLDL Cholesterol Cal: 21 mg/dL (ref 5–40)

## 2022-10-02 LAB — MAGNESIUM: Magnesium: 2 mg/dL (ref 1.6–2.3)

## 2022-10-02 LAB — LDL CHOLESTEROL, DIRECT: LDL Direct: 87 mg/dL (ref 0–99)

## 2022-10-02 LAB — PRO B NATRIURETIC PEPTIDE: NT-Pro BNP: 107 pg/mL (ref 0–486)

## 2022-10-05 NOTE — Progress Notes (Signed)
Called patient to inform about this lab results. And patient new appt is 10/19/2022.

## 2022-10-06 ENCOUNTER — Telehealth (HOSPITAL_COMMUNITY): Payer: Self-pay | Admitting: *Deleted

## 2022-10-06 NOTE — Telephone Encounter (Signed)
Reaching out to patient to offer assistance regarding upcoming cardiac imaging study; pt verbalizes understanding of appt date/time, parking situation and where to check in, pre-test NPO status and medications ordered, and verified current allergies; name and call back number provided for further questions should they arise  Aniel Hubble RN Navigator Cardiac Imaging [facility] [phone] office [phone] cell  Patient to take 25mg metoprolol tartrate two hours prior to his cardiac CT scan. He is aware to arrive at 12pm. 

## 2022-10-07 ENCOUNTER — Ambulatory Visit (HOSPITAL_COMMUNITY)
Admission: RE | Admit: 2022-10-07 | Discharge: 2022-10-07 | Disposition: A | Discharge status: Home / Self Care | Payer: Medicare Other | Source: Ambulatory Visit | Attending: Cardiology | Admitting: Cardiology

## 2022-10-07 DIAGNOSIS — I5032 Chronic diastolic (congestive) heart failure: Secondary | ICD-10-CM | POA: Diagnosis present

## 2022-10-07 DIAGNOSIS — I700 Atherosclerosis of aorta: Secondary | ICD-10-CM | POA: Diagnosis not present

## 2022-10-07 DIAGNOSIS — R0609 Other forms of dyspnea: Secondary | ICD-10-CM | POA: Diagnosis present

## 2022-10-07 MED ORDER — NITROGLYCERIN 0.4 MG SL SUBL
SUBLINGUAL_TABLET | SUBLINGUAL | Status: AC
  Filled 2022-10-07: qty 2

## 2022-10-07 MED ORDER — NITROGLYCERIN 0.4 MG SL SUBL
0.8000 mg | SUBLINGUAL_TABLET | Freq: Once | SUBLINGUAL | Status: AC
  Administered 2022-10-07: 0.8 mg via SUBLINGUAL

## 2022-10-07 MED ORDER — IOHEXOL 350 MG/ML SOLN
100.0000 mL | Freq: Once | INTRAVENOUS | Status: AC | PRN
  Administered 2022-10-07: 100 mL via INTRAVENOUS

## 2022-10-11 DIAGNOSIS — R0609 Other forms of dyspnea: Secondary | ICD-10-CM | POA: Insufficient documentation

## 2022-10-11 DIAGNOSIS — I5032 Chronic diastolic (congestive) heart failure: Secondary | ICD-10-CM | POA: Insufficient documentation

## 2022-10-15 ENCOUNTER — Ambulatory Visit: Payer: Medicare Other | Admitting: Gastroenterology

## 2022-10-19 ENCOUNTER — Encounter: Payer: Self-pay | Admitting: Cardiology

## 2022-10-19 ENCOUNTER — Ambulatory Visit: Payer: Medicare Other | Admitting: Cardiology

## 2022-10-19 VITALS — BP 143/77 | HR 68 | Resp 18 | Ht 70.0 in | Wt 280.6 lb

## 2022-10-19 DIAGNOSIS — I700 Atherosclerosis of aorta: Secondary | ICD-10-CM

## 2022-10-19 DIAGNOSIS — I10 Essential (primary) hypertension: Secondary | ICD-10-CM

## 2022-10-19 DIAGNOSIS — R0609 Other forms of dyspnea: Secondary | ICD-10-CM

## 2022-10-19 DIAGNOSIS — I5032 Chronic diastolic (congestive) heart failure: Secondary | ICD-10-CM

## 2022-10-19 DIAGNOSIS — I493 Ventricular premature depolarization: Secondary | ICD-10-CM

## 2022-10-19 DIAGNOSIS — I2510 Atherosclerotic heart disease of native coronary artery without angina pectoris: Secondary | ICD-10-CM

## 2022-10-19 DIAGNOSIS — R001 Bradycardia, unspecified: Secondary | ICD-10-CM

## 2022-10-19 DIAGNOSIS — E782 Mixed hyperlipidemia: Secondary | ICD-10-CM

## 2022-10-19 MED ORDER — SPIRONOLACTONE 25 MG PO TABS: 25.0000 mg | ORAL_TABLET | Freq: Every morning | ORAL | 0 refills | Status: DC

## 2022-10-19 MED ORDER — ISOSORB DINITRATE-HYDRALAZINE 20-37.5 MG PO TABS: 1.0000 | ORAL_TABLET | Freq: Three times a day (TID) | ORAL | 0 refills | Status: DC

## 2022-10-19 NOTE — Progress Notes (Signed)
ID:  [name], DOB 04/16/1941, MRN [id number]  PCP:  Renaldo Harrison, DO  Cardiologist:  Tessa Lerner, DO, [facility] (established care 12/05/2021)  Date: 10/19/22 Last Office Visit: 09/29/2022  Chief Complaint  Patient presents with   Congestive Heart Failure   Follow-up    3 week and test results    HPI  [name] is a 82 y.o. Caucasian male whose past medical history and cardiovascular risk factors include: Chronic HFpEF, hypertension, hyperlipidemia, history of COVID-19 infection, BPH, obesity due to excess calories, hypothyroidism, advanced age  Patient being followed by the practice for HFpEF management.  When he presented to the 77-month follow-up visit in March 2024 he had concerns with regards to precordial discomfort at times and more specifically dyspnea on exertion.  He has undergone ischemic workup in the past; however, the shared decision was to proceed with coronary CTA to rule out obstructive CAD given his risk factors.  In addition, patient was also educated that his dyspnea on exertion could be secondary to uncontrolled hypertension as well as 15 pounds of weight gain due to dietary indiscretion.  Since last office visit patient has implemented lifestyle changes and has lost 3 pounds.  After starting BiDil his blood pressures have also improved and his dyspnea on exertion is getting better.  He has not made an appointment with [facility] for evaluation of sleep apnea.  FUNCTIONAL STATUS: Still does his activities of daily living without any limitations.  But no structured exercise program or daily routine.  ALLERGIES: Allergies  Allergen Reactions   Mobic [Meloxicam] Swelling    Face and tongue swelling      MEDICATION LIST PRIOR TO VISIT: Current Meds  Medication Sig   allopurinol (ZYLOPRIM) 100 MG tablet Take 100 mg by mouth daily as needed (gout).   amLODipine (NORVASC) 10 MG tablet Take 1 tablet by mouth daily.   atorvastatin (LIPITOR) 20 MG  tablet TAKE 1 TABLET BY MOUTH EVERYDAY AT BEDTIME   Cholecalciferol (VITAMIN D3) 125 MCG (5000 UT) CAPS Take 5,000 Units by mouth every evening.   levothyroxine (SYNTHROID) 150 MCG tablet Take 1 tablet (150 mcg total) by mouth daily before breakfast.   omeprazole (PRILOSEC) 40 MG capsule Take 1 capsule (40 mg total) by mouth daily. TAKE 1 CAPSULE BY MOUTH EVERY DAY   spironolactone (ALDACTONE) 25 MG tablet Take 1 tablet (25 mg total) by mouth in the morning.   tamsulosin (FLOMAX) 0.4 MG CAPS capsule Take 0.4 mg by mouth at bedtime.    valsartan-hydrochlorothiazide (DIOVAN-HCT) 320-12.5 MG tablet Take 1 tablet by mouth daily.   vitamin C (ASCORBIC ACID) 500 MG tablet Take 500 mg by mouth at bedtime.   [DISCONTINUED] isosorbide-hydrALAZINE (BIDIL) 20-37.5 MG tablet Take 1 tablet by mouth 3 (three) times daily.     PAST MEDICAL HISTORY: Past Medical History:  Diagnosis Date   Arthritis    "fingers; knees; toes" (01/05/2014)   Barrett's esophagus    BPH (benign prostatic hyperplasia)    Bradycardia 2009   Sinus rhythm in the 40s without symptoms;   GERD (gastroesophageal reflux disease)    ulcerative/erosive   Gout    Hemorrhoids    Hiatal hernia    History of skin cancer    Hx of adenomatous colonic polyps    Hyperlipidemia    Hypertension    Hypothyroidism    Following partial thyroidectomy   Squamous carcinoma    skin cancer benign arms and right ear and rightside lip  Tobacco abuse, in remission    10-20 pack years; discontinued in 1981    PAST SURGICAL HISTORY: Past Surgical History:  Procedure Laterality Date   ABDOMINAL HERNIA REPAIR  1990's   BIOPSY  04/13/2018   Procedure: BIOPSY;  Surgeon: Corbin Ade, MD;  [facility]: [facility];  Service: Endoscopy;;   BIOPSY  06/27/2019   Procedure: BIOPSY;  Surgeon: Corbin Ade, MD;  [facility];  Service: Endoscopy;;  Distal Esophagus biopsy   BIOPSY  05/20/2022   Procedure: BIOPSY;  Surgeon: Corbin Ade, MD;  [facility]: [facility];  Service: Endoscopy;;   CATARACT EXTRACTION W/ INTRAOCULAR LENS  IMPLANT, BILATERAL Bilateral 2012-2014   COLONOSCOPY  12/2005   normal   COLONOSCOPY  2004   tubulovillous adenoma of rectum    COLONOSCOPY  01/12/2011   Dr. Jena Gauss- normal rectum   COLONOSCOPY N/A 07/09/2015   Procedure: COLONOSCOPY;  Surgeon: Corbin Ade, MD;  [facility];  Service: Endoscopy;  Laterality: N/A;  730    COLONOSCOPY N/A 01/04/2019   Procedure: COLONOSCOPY;  Surgeon: Corbin Ade, MD;  [facility];  Service: Endoscopy;  Laterality: N/A;  10:45am   ELBOW ARTHROSCOPY Left 2014   ESOPHAGEAL DILATION N/A 02/13/2015   Procedure: ESOPHAGEAL DILATION;  Surgeon: Corbin Ade, MD;  [facility];  Service: Endoscopy;  Laterality: N/A;   ESOPHAGOGASTRODUODENOSCOPY  01/2006   geographic erosive RE, noncritical peptic stricture s/p dilation, antral erosion   ESOPHAGOGASTRODUODENOSCOPY  01/12/2011   Dr. Jena Gauss- hiatal hernia, distal esophageal erosions, barretts esophagus   ESOPHAGOGASTRODUODENOSCOPY  01/27/2012   Dr.Rourk- short segment barretts esophagus, erosive reflux esophagitis. bx= barretts esophagus, negative for dysplasia   ESOPHAGOGASTRODUODENOSCOPY N/A 02/13/2015   Dr.Rourk- ulcerative/erosive reflux esophagitis, abnormal esophagitis c/w prior diagnosis of barretts esophagus, hiatal hernia. bx=barretts esophagus   ESOPHAGOGASTRODUODENOSCOPY N/A 04/13/2018   Procedure: ESOPHAGOGASTRODUODENOSCOPY (EGD);  Surgeon: Corbin Ade, MD;  [facility];  Service: Endoscopy;  Laterality: N/A;  2:15pm   ESOPHAGOGASTRODUODENOSCOPY N/A 06/27/2019   salmon-colored mucosa of esophagus with biopsy negative for Barrett's. Biopsy in 2019 consitent with Barrett's. normal duodenal bulb, small hiatal hernia.   ESOPHAGOGASTRODUODENOSCOPY (EGD) WITH PROPOFOL N/A 05/20/2022   Procedure: ESOPHAGOGASTRODUODENOSCOPY (EGD) WITH PROPOFOL;  Surgeon:  Corbin Ade, MD;  [facility];  Service: Endoscopy;  Laterality: N/A;  9:15am, asa 3   HEMORRHOID SURGERY N/A 07/12/2015   Procedure: SIMPLE HEMORRHOIDECTOMY;  Surgeon: Franky Macho, MD;  Location: [facility];  Service: General;  Laterality: N/A;   HERNIA REPAIR     MALONEY DILATION  01/12/2011   Procedure: Elease Hashimoto DILATION;  Surgeon: Corbin Ade, MD;  [facility];  Service: Endoscopy;  Laterality: N/A;  56 french   MALONEY DILATION N/A 04/13/2018   Procedure: MALONEY DILATION;  Surgeon: Corbin Ade, MD;  [facility];  Service: Endoscopy;  Laterality: N/A;   MALONEY DILATION N/A 06/27/2019   Procedure: Elease Hashimoto DILATION;  Surgeon: Corbin Ade, MD;  [facility];  Service: Endoscopy;  Laterality: N/A;   MALONEY DILATION N/A 05/20/2022   Procedure: Elease Hashimoto DILATION;  Surgeon: Corbin Ade, MD;  [facility];  Service: Endoscopy;  Laterality: N/A;   SQUAMOUS CELL CARCINOMA EXCISION     "cut a piece of skin, off my hands; left arm; under right lip; right ear" (01/05/2014)   THYROIDECTOMY, PARTIAL Right 1969   lobe-nodule present   TOTAL KNEE ARTHROPLASTY  Left 01/05/2014   Procedure: LEFT TOTAL KNEE ARTHROPLASTY;  Surgeon: Kathryne Hitch, MD;  Location[facility];  Service: Orthopedics;  Laterality: Left;   TOTAL KNEE ARTHROPLASTY Right 05/04/2014   Procedure: RIGHT TOTAL KNEE ARTHROPLASTY;  Surgeon: Kathryne Hitch, MD;  Location: [facility];  Service: Orthopedics;  Laterality: Right;   UMBILICAL HERNIA REPAIR  2000   WISDOM TOOTH EXTRACTION  06/19/2020    FAMILY HISTORY: The patient family history includes Colon polyps in his brother; Lung cancer (age of onset: 30) in his father.  SOCIAL HISTORY:  The patient  reports that he quit smoking about 42 years ago. His smoking use included cigarettes. He started smoking about 66 years ago. He has a 20.00 pack-year smoking history. He has been exposed to tobacco smoke. He  has never used smokeless tobacco. He reports that he does not currently use alcohol. He reports that he does not use drugs.  REVIEW OF SYSTEMS: Review of Systems  Constitutional: Positive for weight loss.  Cardiovascular:  Positive for dyspnea on exertion (Improving). Negative for chest pain, claudication, irregular heartbeat, leg swelling, near-syncope, orthopnea, palpitations, paroxysmal nocturnal dyspnea and syncope.  Respiratory:  Positive for shortness of breath and snoring.   Hematologic/Lymphatic: Negative for bleeding problem.  Musculoskeletal:  Negative for muscle cramps and myalgias.  Neurological:  Negative for dizziness and light-headedness.    PHYSICAL EXAM:    10/19/2022    2:49 PM 10/07/2022   12:23 PM 09/29/2022    9:33 AM  Vitals with BMI  Height 5\' 10"   5\' 10"   Weight 280 lbs 10 oz  283 lbs 6 oz  BMI 40.26  40.66  Systolic 143 124 355  Diastolic 77 83 86  Pulse 68 55 70    Physical Exam  Constitutional: No distress.  Age appropriate, hemodynamically stable.   Neck: No JVD present.  Cardiovascular: Normal rate, regular rhythm, S1 normal, S2 normal, intact distal pulses and normal pulses. Exam reveals no gallop, no S3 and no S4.  No murmur heard. Pulses:      Dorsalis pedis pulses are 2+ on the right side and 2+ on the left side.       Posterior tibial pulses are 2+ on the right side and 2+ on the left side.  Pulmonary/Chest: Effort normal and breath sounds normal. No stridor. He has no wheezes. He has no rales.  Abdominal: Soft. Bowel sounds are normal. He exhibits no distension. There is no abdominal tenderness.  Musculoskeletal:        General: No edema.     Cervical back: Neck supple.  Neurological: He is alert and oriented to person, place, and time. He has intact cranial nerves (2-12).  Skin: Skin is warm and moist.    CARDIAC DATABASE: EKG: 09/29/2022: Sinus bradycardia, 50 bpm, normal axis, without underlying ischemia injury  pattern.  Echocardiogram: 09/25/2015: Technically difficult study, LVEF 60 to 65%, grade 1 diastolic impairment, mild MR, moderate LAE, liver cyst 4 x 5.5 cm.  12/18/2021: Left ventricle cavity is normal in size. Mild concentric hypertrophy of the left ventricle. Normal global wall motion. Normal LV systolic function with EF 62%. Doppler evidence of grade I (impaired) diastolic dysfunction, normal LAP.  The aortic root is borderline dilated at 3.9 cm. Left atrial cavity is mildly dilated. Aneurysmal interatrial septum without 2D or color Doppler evidence of shunting. Mild to moderate mitral regurgitation. Mild tricuspid regurgitation. Mild pulmonic regurgitation. IVC is not seen.  Incidental finding of large echolucent structure in lives,  possible cyst. Recommend clinical correlation.   Stress Testing: Lexiscan (with Mod Bruce protocol) Nuclear stress test 03/10/2022 Myocardial perfusion is normal. Low risk study. Overall LV systolic function is normal without regional wall motion abnormalities. Stress LV EF: 58%.  Nondiagnostic ECG stress. The heart rate response was consistent with Regadenoson. The blood pressure response was physiologic. No previous exam available for comparison.  Heart Catheterization: None  Coronary CTA. October 11, 2022 1. Total coronary calcium score of 74.7. This was 18th percentile for age and sex matched control.  2. Normal coronary origin with right dominance.  3. CAD-RADS = 1.  Left Main: Patent with minimal calcified plaque within the distal segment.  LAD: Minimal calcified plaque within the proximal and mid LAD without stenosis, otherwise vessel is patent.  LCx: Patent.  RCA: Patent.  4. Scattered aortic atherosclerosis.  5. Quality of scan is limited due to signal to noise and mis-registration artifact.  6. Scattered aortic atherosclerosis.  Noncardiac findings:  Aortic Atherosclerosis (ICD10-I70.0). Hypodensities within the liver likely  representing cysts. Ultrasound could be helpful for further evaluation.  RECOMMENDATIONS: Consider non-atherosclerotic causes of chest pain. Consider preventive therapy and risk factor modification.   LABORATORY DATA:    Latest Ref Rng & Units 07/10/2015   11:00 AM 05/05/2014    4:55 AM 04/27/2014    9:35 AM  CBC  WBC 4.0 - 10.5 K/uL 8.5  14.2  6.8   Hemoglobin 13.0 - 17.0 g/dL 69.6  29.5  28.4   Hematocrit 39.0 - 52.0 % 42.1  34.8  41.6   Platelets 150 - 400 K/uL 181  193  206        Latest Ref Rng & Units 10/01/2022   10:17 AM 04/15/2022    9:31 AM 03/05/2022    1:54 PM  CMP  Glucose 70 - 99 mg/dL 95  132  440   BUN 8 - 27 mg/dL 23  19  25    Creatinine 0.76 - 1.27 mg/dL 1.02  7.25  3.66   Sodium 134 - 144 mmol/L 143  144  145   Potassium 3.5 - 5.2 mmol/L 4.2  4.2  4.2   Chloride 96 - 106 mmol/L 103  104  102   CO2 20 - 29 mmol/L 24  27  27    Calcium 8.6 - 10.2 mg/dL 9.8  9.8  9.6   Total Protein 6.0 - 8.5 g/dL   6.8   Total Bilirubin 0.0 - 1.2 mg/dL   0.5   Alkaline Phos 44 - 121 IU/L   69   AST 0 - 40 IU/L   20   ALT 0 - 44 IU/L   21     Lipid Panel  Lab Results  Component Value Date   CHOL 139 10/01/2022   HDL 31 (L) 10/01/2022   LDLCALC 87 10/01/2022   LDLDIRECT 87 10/01/2022   TRIG 113 10/01/2022   CHOLHDL 4.1 11/27/2020   No components found for: "NTPROBNP" Recent Labs    12/05/21 1041 10/01/22 1017  PROBNP 148 107   No results for input(s): "TSH" in the last 8760 hours.  BMP Recent Labs    03/05/22 1354 04/15/22 0931 10/01/22 1017  NA 145* 144 143  K 4.2 4.2 4.2  CL 102 104 103  CO2 27 27 24   GLUCOSE 105* 100* 95  BUN 25 19 23   CREATININE 1.32* 1.33* 1.39*  CALCIUM 9.6 9.8 9.8    HEMOGLOBIN A1C No results found for: "HGBA1C", "MPG"  External  Labs:  Date Collected: 11/11/2021 , information obtained by referring physician BNP 126 Potassium: 4.0 Creatinine 1.15 mg/dL. eGFR: 64 mL/min per 1.73 m Hemoglobin: 13.8 g/dL and hematocrit:  16.1 % Lipid profile: Total cholesterol 168 , triglycerides 190 , HDL 36 , LDL 101 non-HDL 132 AST: 18 , ALT: 19 , alkaline phosphatase: 72  TSH: 3.93    External Labs: Collected: 01/30/2022 provided by the patient. BNP 338 BUN 23, creatinine 1.42. eGFR 50. Sodium 144, potassium 4.4, chloride 102, bicarb 25  Collected 03/06/2022 provided by the patient Total cholesterol 144, triglycerides 191, HDL 38, LDL 81 Direct LDL 86 BUN 25, creatinine 0.96. Sodium 145, potassium 4.2, chloride 102, bicarb 27. AST 20, ALT 21, alkaline phosphatase 69 Magnesium 1.8  IMPRESSION:    ICD-10-CM   1. Chronic heart failure with preserved ejection fraction (HFpEF)  I50.32 spironolactone (ALDACTONE) 25 MG tablet    isosorbide-hydrALAZINE (BIDIL) 20-37.5 MG tablet    Basic metabolic panel    Magnesium    Magnesium    Basic metabolic panel    2. Dyspnea on exertion  R06.09     3. Coronary atherosclerosis due to calcified coronary lesion  I25.10    I25.84     4. Atherosclerosis of aorta  I70.0     5. Bradycardia  R00.1     6. PVC's (premature ventricular contractions)  I49.3     7. Benign hypertension  I10     8. Mixed hyperlipidemia  E78.2     9. Class 3 severe obesity due to excess calories with serious comorbidity and body mass index (BMI) of 40.0 to 44.9 in adult  E66.01    Z68.41        RECOMMENDATIONS: [name] is a 82 y.o. Caucasian male whose past medical history and cardiac risk factors include: HFpEF, hypertension, hyperlipidemia, history of COVID-19 infection, BPH, obesity due to excess calories, hypothyroidism, advanced age.   Chronic heart failure with preserved ejection fraction (HFpEF) (HCC) Stage B, NYHA class II. Echo: Preserved LVEF, grade 1 diastolic impairment, mild to moderate valvular heart disease. Total coronary calcium score: 74.7, 18th percentile. Stress MPI: Low risk metabolic provide 5 Coronary CTA: CAD RADS 1 overall quality of the scan limited due to  signal-to-noise and misregistration artifact. Scheduled to have sleep study evaluation with [facility] on 11/03/2022, per patient In the past Sherryll Burger was cost prohibitive.  Currently on valsartan/HCTZ. Home blood pressure not well-controlled but improving Refilled BiDil, per patient's request Start spironolactone 25 mg p.o. every morning with labs in 1 week to evaluate kidney function and electrolytes Reemphasized importance of improving his modifiable cardiovascular risk factors.  Coronary atherosclerosis due to calcified coronary lesion Atherosclerosis of aorta Total CAC 74.7, 18th percentile Continue atorvastatin Reemphasized importance of lipid and triglyceride management Has undergone appropriate cardiovascular workup as outlined above.  Educated him regarding the importance of secondary prevention.  For now he would like to continue with lifestyle changes and we will discussed medication such as Wegovy or Rybelsus for weight loss given the CAC, HFpEF.  However, I suspect it will be because prohibitive given his coverage for medications like Entresto.  Bradycardia PVC's (premature ventricular contractions) Asymptomatic. Currently not on AV nodal blocking agents. Monitor for now  Benign hypertension Improving Office blood pressures are improving when compared to prior visits, but currently not at goal. Medication changes as discussed above. Reemphasized importance of a low-salt diet, avoiding canned foods/frozen foods, minimizing eating out.  Mixed hyperlipidemia Currently on atorvastatin.  He denies myalgia or other side effects. Most recent lipids dated September 2023 reviewed as noted above. Currently managed by primary care provider.  Class 3 severe obesity due to excess calories with serious comorbidity and body mass index (BMI) of 40.0 to 44.9 in adult Body mass index is 40.26 kg/m. I reviewed with him importance of diet, regular physical activity/exercise, weight  loss.   Patient is educated on the importance of increasing physical activity gradually as tolerated with a goal of moderate intensity exercise for 30 minutes a day 5 days a week.   FINAL MEDICATION LIST END OF ENCOUNTER: Meds ordered this encounter  Medications   spironolactone (ALDACTONE) 25 MG tablet    Sig: Take 1 tablet (25 mg total) by mouth in the morning.    Dispense:  90 tablet    Refill:  0   isosorbide-hydrALAZINE (BIDIL) 20-37.5 MG tablet    Sig: Take 1 tablet by mouth 3 (three) times daily.    Dispense:  270 tablet    Refill:  0    Medications Discontinued During This Encounter  Medication Reason   metoprolol tartrate (LOPRESSOR) 25 MG tablet Discontinued by provider   isosorbide-hydrALAZINE (BIDIL) 20-37.5 MG tablet Reorder     Current Outpatient Medications:    allopurinol (ZYLOPRIM) 100 MG tablet, Take 100 mg by mouth daily as needed (gout)., Disp: , Rfl:    amLODipine (NORVASC) 10 MG tablet, Take 1 tablet by mouth daily., Disp: , Rfl:    atorvastatin (LIPITOR) 20 MG tablet, TAKE 1 TABLET BY MOUTH EVERYDAY AT BEDTIME, Disp: 90 tablet, Rfl: 0   Cholecalciferol (VITAMIN D3) 125 MCG (5000 UT) CAPS, Take 5,000 Units by mouth every evening., Disp: , Rfl:    levothyroxine (SYNTHROID) 150 MCG tablet, Take 1 tablet (150 mcg total) by mouth daily before breakfast., Disp: 90 tablet, Rfl: 3   omeprazole (PRILOSEC) 40 MG capsule, Take 1 capsule (40 mg total) by mouth daily. TAKE 1 CAPSULE BY MOUTH EVERY DAY, Disp: 90 capsule, Rfl: 3   spironolactone (ALDACTONE) 25 MG tablet, Take 1 tablet (25 mg total) by mouth in the morning., Disp: 90 tablet, Rfl: 0   tamsulosin (FLOMAX) 0.4 MG CAPS capsule, Take 0.4 mg by mouth at bedtime. , Disp: , Rfl:    valsartan-hydrochlorothiazide (DIOVAN-HCT) 320-12.5 MG tablet, Take 1 tablet by mouth daily., Disp: , Rfl:    vitamin C (ASCORBIC ACID) 500 MG tablet, Take 500 mg by mouth at bedtime., Disp: , Rfl:    isosorbide-hydrALAZINE (BIDIL) 20-37.5  MG tablet, Take 1 tablet by mouth 3 (three) times daily., Disp: 270 tablet, Rfl: 0  Orders Placed This Encounter  Procedures   Basic metabolic panel   Magnesium    There are no Patient Instructions on file for this visit.   --Continue cardiac medications as reconciled in final medication list. --Return in about 6 months (around 04/20/2023) for Follow up, heart failure management.. or sooner if needed. --Continue follow-up with your primary care physician regarding the management of your other chronic comorbid conditions.  Patient's questions and concerns were addressed to his satisfaction. He voices understanding of the instructions provided during this encounter.   This note was created using a voice recognition software as a result there may be grammatical errors inadvertently enclosed that do not reflect the nature of this encounter. Every attempt is made to correct such errors.  Tessa Lerner, [place], [facility]  Pager:  [phone] Office: [phone]

## 2022-10-20 NOTE — Progress Notes (Signed)
Called patient no answer left a vm

## 2022-10-21 NOTE — Progress Notes (Signed)
LMTCB

## 2022-10-21 NOTE — Progress Notes (Signed)
Gave patient message. He verbalized understanding.

## 2022-10-22 ENCOUNTER — Other Ambulatory Visit: Payer: Self-pay | Admitting: Cardiology

## 2022-10-22 DIAGNOSIS — I5032 Chronic diastolic (congestive) heart failure: Secondary | ICD-10-CM

## 2022-10-23 ENCOUNTER — Other Ambulatory Visit: Payer: Self-pay | Admitting: Cardiology

## 2022-10-23 DIAGNOSIS — E782 Mixed hyperlipidemia: Secondary | ICD-10-CM

## 2022-10-29 ENCOUNTER — Other Ambulatory Visit: Payer: Self-pay | Admitting: Cardiology

## 2022-10-29 DIAGNOSIS — I5032 Chronic diastolic (congestive) heart failure: Secondary | ICD-10-CM

## 2022-11-02 ENCOUNTER — Telehealth: Payer: Self-pay

## 2022-11-02 NOTE — Telephone Encounter (Signed)
Pt called with phone report. Pt states that the fiber is working well.

## 2022-11-17 ENCOUNTER — Ambulatory Visit: Payer: PRIVATE HEALTH INSURANCE | Admitting: Cardiology

## 2022-12-02 ENCOUNTER — Other Ambulatory Visit: Payer: Self-pay

## 2022-12-02 MED ORDER — HYDRALAZINE HCL 50 MG PO TABS: 50.0000 mg | ORAL_TABLET | Freq: Three times a day (TID) | ORAL | 3 refills | Status: DC

## 2022-12-02 MED ORDER — ISOSORBIDE DINITRATE 20 MG PO TABS: 20.0000 mg | ORAL_TABLET | Freq: Three times a day (TID) | ORAL | 3 refills | Status: DC

## 2022-12-08 ENCOUNTER — Encounter: Payer: Self-pay | Admitting: Gastroenterology

## 2022-12-08 ENCOUNTER — Ambulatory Visit (INDEPENDENT_AMBULATORY_CARE_PROVIDER_SITE_OTHER): Payer: Medicare Other | Admitting: Gastroenterology

## 2022-12-08 ENCOUNTER — Ambulatory Visit: Payer: Medicare Other | Admitting: Internal Medicine

## 2022-12-08 VITALS — BP 163/92 | HR 66

## 2022-12-08 DIAGNOSIS — K2100 Gastro-esophageal reflux disease with esophagitis, without bleeding: Secondary | ICD-10-CM

## 2022-12-08 DIAGNOSIS — R151 Fecal smearing: Secondary | ICD-10-CM

## 2022-12-08 DIAGNOSIS — K2270 Barrett's esophagus without dysplasia: Secondary | ICD-10-CM

## 2022-12-08 NOTE — Progress Notes (Signed)
GI Office Note    Referring Provider: Renaldo Harrison, DO Primary Care Physician:  Renaldo Harrison, DO Primary Gastroenterologist: Gerrit Friends.Rourk, MD  Date:  12/08/2022  ID:  [name], DOB October 07, 1940, MRN [id number]   Chief Complaint   Chief Complaint  Patient presents with   Gastroesophageal Reflux    Patient here today for a follow up on Gerd. Patient takes omeprazole 40 mg once per day this does seem to cover his symptoms.    History of Present Illness  [name] is a 82 y.o. male with a history of fecal seepage and hemorrhoids s/p banding x 2, Barrett's esophagus, GERD, HLD, HTN, hypothyroidism, squamous cell carcinoma of the skin, arthritis, and gout presenting today for follow-up.  Last colonoscopy in 2020: -2 polyps in the descending and cecum -Colonoscopy otherwise normal -Path: Tubular adenomas -No future colonoscopy unless new symptoms develop  Last EGD 05/20/2022: -Normal-appearing azygos, undulating Z-line s/p dilation -Small hiatal hernia -Abnormal gastric mucosa s/p biopsy -Normal duodenum -No evidence of Barrett's on biopsy -Office visit in 3 months  Last office visit 09/15/2022 with Dr. Jena Gauss.  Patient reported issues of fecal seepage and had no improvement with hemorrhoid banding.  Has a bowel movement every morning and then about 2-3 hours later he feels the need to clean himself due to seepage.  He reportedly was following up with fortified fiber diet but was not taking any supplements.  He was advised to start Benefiber 1 tablespoon daily for 3 weeks and then increase to 2 tablespoons daily.  Follow-up in the office in 3 months and to call with a progress report in 1 month.  Advised changing omeprazole to 30 minutes prior to supper instead of at bedtime and to try fiber prior to pursuing a resin binder.   Patient reported on 11/02/2022 that fiber was working well.  Today:  No longer having fecal seepage.Taking a tablespoon once daily currently and that  is working well. Did briefly increase to 2 per day.   When he checked his BP at home it was 135/64 this morning. States it kept saying error this morning on our machine and does not feel like his BP is this high.   States after his last colonoscopy he felt something on his rectum and he came back to day surgery and had Dr. Lovell Sheehan come look. He had an appointment the day after and had surgery.   Doing well on omeprazole 30 minutes before supper. No N/V. No dysphagia. No abdominal pain. No melena or brbpr.   Started on CPAP last night. Slept well last night. Feels like a new man.  Current Outpatient Medications  Medication Sig Dispense Refill   allopurinol (ZYLOPRIM) 100 MG tablet Take 100 mg by mouth daily as needed (gout).     amLODipine (NORVASC) 10 MG tablet Take 1 tablet by mouth daily.     atorvastatin (LIPITOR) 20 MG tablet TAKE 1 TABLET BY MOUTH EVERYDAY AT BEDTIME 90 tablet 0   Cholecalciferol (VITAMIN D3) 125 MCG (5000 UT) CAPS Take 5,000 Units by mouth every evening.     hydrALAZINE (APRESOLINE) 50 MG tablet Take 1 tablet (50 mg total) by mouth 3 (three) times daily. 270 tablet 3   levothyroxine (SYNTHROID) 150 MCG tablet Take 1 tablet (150 mcg total) by mouth daily before breakfast. 90 tablet 3   omeprazole (PRILOSEC) 40 MG capsule Take 1 capsule (40 mg total) by mouth daily. TAKE 1 CAPSULE BY MOUTH EVERY DAY 90 capsule 3  spironolactone (ALDACTONE) 25 MG tablet Take 1 tablet (25 mg total) by mouth in the morning. 90 tablet 0   tamsulosin (FLOMAX) 0.4 MG CAPS capsule Take 0.4 mg by mouth at bedtime.      vitamin C (ASCORBIC ACID) 500 MG tablet Take 500 mg by mouth at bedtime.     isosorbide dinitrate (ISORDIL) 20 MG tablet Take 1 tablet (20 mg total) by mouth 3 (three) times daily. (Patient not taking: Reported on 12/08/2022) 270 tablet 3   valsartan-hydrochlorothiazide (DIOVAN-HCT) 320-12.5 MG tablet Take 1 tablet by mouth daily. (Patient not taking: Reported on 12/08/2022)     No  current facility-administered medications for this visit.    Past Medical History:  Diagnosis Date   Arthritis    "fingers; knees; toes" (01/05/2014)   Barrett's esophagus    BPH (benign prostatic hyperplasia)    Bradycardia 2009   Sinus rhythm in the 40s without symptoms;   GERD (gastroesophageal reflux disease)    ulcerative/erosive   Gout    Hemorrhoids    Hiatal hernia    History of skin cancer    Hx of adenomatous colonic polyps    Hyperlipidemia    Hypertension    Hypothyroidism    Following partial thyroidectomy   Squamous carcinoma    skin cancer benign arms and right ear and rightside lip   Tobacco abuse, in remission    10-20 pack years; discontinued in 1981    Past Surgical History:  Procedure Laterality Date   ABDOMINAL HERNIA REPAIR  1990's   BIOPSY  04/13/2018   Procedure: BIOPSY;  Surgeon: Corbin Ade, MD;  Location: [facility];  Service: Endoscopy;;   BIOPSY  06/27/2019   Procedure: BIOPSY;  Surgeon: Corbin Ade, MD;  Location: [facility];  Service: Endoscopy;;  Distal Esophagus biopsy   BIOPSY  05/20/2022   Procedure: BIOPSY;  Surgeon: Corbin Ade, MD;  [facility]: [facility];  Service: Endoscopy;;   CATARACT EXTRACTION W/ INTRAOCULAR LENS  IMPLANT, BILATERAL Bilateral 2012-2014   COLONOSCOPY  12/2005   normal   COLONOSCOPY  2004   tubulovillous adenoma of rectum    COLONOSCOPY  01/12/2011   Dr. Jena Gauss- normal rectum   COLONOSCOPY N/A 07/09/2015   Procedure: COLONOSCOPY;  Surgeon: Corbin Ade, MD;  [facility]: [facility];  Service: Endoscopy;  Laterality: N/A;  730    COLONOSCOPY N/A 01/04/2019   Procedure: COLONOSCOPY;  Surgeon: Corbin Ade, MD;  [facility]: [facility];  Service: Endoscopy;  Laterality: N/A;  10:45am   ELBOW ARTHROSCOPY Left 2014   ESOPHAGEAL DILATION N/A 02/13/2015   Procedure: ESOPHAGEAL DILATION;  Surgeon: Corbin Ade, MD;  [facility]: [facility];  Service: Endoscopy;  Laterality: N/A;    ESOPHAGOGASTRODUODENOSCOPY  01/2006   geographic erosive RE, noncritical peptic stricture s/p dilation, antral erosion   ESOPHAGOGASTRODUODENOSCOPY  01/12/2011   Dr. Jena Gauss- hiatal hernia, distal esophageal erosions, barretts esophagus   ESOPHAGOGASTRODUODENOSCOPY  01/27/2012   Dr.Rourk- short segment barretts esophagus, erosive reflux esophagitis. bx= barretts esophagus, negative for dysplasia   ESOPHAGOGASTRODUODENOSCOPY N/A 02/13/2015   Dr.Rourk- ulcerative/erosive reflux esophagitis, abnormal esophagitis c/w prior diagnosis of barretts esophagus, hiatal hernia. bx=barretts esophagus   ESOPHAGOGASTRODUODENOSCOPY N/A 04/13/2018   Procedure: ESOPHAGOGASTRODUODENOSCOPY (EGD);  Surgeon: Corbin Ade, MD;  [facility]: [facility];  Service: Endoscopy;  Laterality: N/A;  2:15pm   ESOPHAGOGASTRODUODENOSCOPY N/A 06/27/2019   salmon-colored mucosa of esophagus with biopsy negative for Barrett's. Biopsy in 2019 consitent with Barrett's. normal  duodenal bulb, small hiatal hernia.   ESOPHAGOGASTRODUODENOSCOPY (EGD) WITH PROPOFOL N/A 05/20/2022   Procedure: ESOPHAGOGASTRODUODENOSCOPY (EGD) WITH PROPOFOL;  Surgeon: Corbin Ade, MD;  [facility]: [facility];  Service: Endoscopy;  Laterality: N/A;  9:15am, asa 3   HEMORRHOID SURGERY N/A 07/12/2015   Procedure: SIMPLE HEMORRHOIDECTOMY;  Surgeon: Franky Macho, MD;  Location: [facility];  Service: General;  Laterality: N/A;   HERNIA REPAIR     MALONEY DILATION  01/12/2011   Procedure: Elease Hashimoto DILATION;  Surgeon: Corbin Ade, MD;  [facility]: [facility];  Service: Endoscopy;  Laterality: N/A;  56 french   MALONEY DILATION N/A 04/13/2018   Procedure: MALONEY DILATION;  Surgeon: Corbin Ade, MD;  [facility]: [facility];  Service: Endoscopy;  Laterality: N/A;   MALONEY DILATION N/A 06/27/2019   Procedure: Elease Hashimoto DILATION;  Surgeon: Corbin Ade, MD;  [facility];  Service: Endoscopy;  Laterality: N/A;   MALONEY DILATION N/A 05/20/2022    Procedure: Elease Hashimoto DILATION;  Surgeon: Corbin Ade, MD;  [facility]: [facility];  Service: Endoscopy;  Laterality: N/A;   SQUAMOUS CELL CARCINOMA EXCISION     "cut a piece of skin, off my hands; left arm; under right lip; right ear" (01/05/2014)   THYROIDECTOMY, PARTIAL Right 1969   lobe-nodule present   TOTAL KNEE ARTHROPLASTY Left 01/05/2014   Procedure: LEFT TOTAL KNEE ARTHROPLASTY;  Surgeon: Kathryne Hitch, MD;  Location: [facility];  Service: Orthopedics;  Laterality: Left;   TOTAL KNEE ARTHROPLASTY Right 05/04/2014   Procedure: RIGHT TOTAL KNEE ARTHROPLASTY;  Surgeon: Kathryne Hitch, MD;  Location: [facility];  Service: Orthopedics;  Laterality: Right;   UMBILICAL HERNIA REPAIR  2000   WISDOM TOOTH EXTRACTION  06/19/2020    Family History  Problem Relation Age of Onset   Lung cancer Father 6   Colon polyps Brother    Colon cancer Neg Hx    Liver disease Neg Hx     Allergies as of 12/08/2022 - Review Complete 12/08/2022  Allergen Reaction Noted   Mobic [meloxicam] Swelling 06/20/2020    Social History   Socioeconomic History   Marital status: Married    Spouse name: Not on file   Number of children: 2   Years of education: Not on file   Highest education level: Not on file  Occupational History   Occupation: Actor    Comment: Current employment   Occupation: Sports administrator    Comment: Prior employment  Tobacco Use   Smoking status: Former    Packs/day: 1.00    Years: 20.00    Additional pack years: 0.00    Total pack years: 20.00    Types: Cigarettes    Start date: 07/06/1956    Quit date: 07/06/1980    Years since quitting: 42.4    Passive exposure: Past   Smokeless tobacco: Never   Tobacco comments:    "quit smoking in ~ 1982"  Vaping Use   Vaping Use: Never used  Substance and Sexual Activity   Alcohol use: Not Currently    Comment: occ beer   Drug use: No   Sexual activity: Yes    Partners: Female    Comment: spouse   Other Topics Concern   Not on file  Social History Narrative   Not on file   Social Determinants of Health   Financial Resource Strain: Not on file  Food Insecurity: Not on file  Transportation Needs: Not on file  Physical Activity: Not  on file  Stress: Not on file  Social Connections: Not on file     Review of Systems   Gen: Denies fever, chills, anorexia. Denies fatigue, weakness, weight loss.  CV: Denies chest pain, palpitations, syncope, peripheral edema, and claudication. Resp: Denies dyspnea at rest, cough, wheezing, coughing up blood, and pleurisy. GI: See HPI Derm: Denies rash, itching, dry skin Psych: Denies depression, anxiety, memory loss, confusion. No homicidal or suicidal ideation.  Heme: Denies bruising, bleeding, and enlarged lymph nodes.   Physical Exam   BP (!) 163/92 (BP Location: Left Arm, Patient Position: Sitting, Cuff Size: Large)   Pulse 66   General:   Alert and oriented. No distress noted. Pleasant and cooperative.  Head:  Normocephalic and atraumatic. Eyes:  Conjuctiva clear without scleral icterus. Mouth:  Oral mucosa pink and moist. Good dentition. No lesions. Abdomen:  +BS, soft, non-tender and non-distended. No rebound or guarding. No HSM or masses noted. Rectal: deferred Msk:  Symmetrical without gross deformities. Normal posture. Extremities:  Without edema. Neurologic:  Alert and  oriented x4 Psych:  Alert and cooperative. Normal mood and affect.   Assessment  [name] is a 82 y.o. male with a history of fecal seepage and hemorrhoids s/p banding x 2, Barrett's esophagus, GERD, HLD, HTN, hypothyroidism, squamous cell carcinoma of the skin, arthritis, and gout presenting today for follow-up.  GERD, Barrett's esophagus: History of Barrett's esophagus.  Most recent EGD in 2023 without pathology evidence of Barrett's.  Dilation performed for dysphagia.  Continues to deny any symptoms of dysphagia currently.  GERD well-controlled with  omeprazole 40 mg once daily, 30 minutes prior to dinner.  Fecal seepage: Well-controlled with 1 tablespoon of fiber daily.  Previous hemorrhoid banding to help with seepage which was fairly unsuccessful.  Has been doing well with fiber.  Advised to continue.  PLAN   Continue omeprazole 40 mg once daily GERD diet Continue fiber supplement daily Follow-up in 1 year, sooner if needed.     Brooke Bonito, MSN, FNP-BC, AGACNP-BC [facility]

## 2022-12-08 NOTE — Patient Instructions (Signed)
Continue omeprazole 40 mg once daily, 30 minutes prior to dinner.  Follow a GERD diet:  Avoid fried, fatty, greasy, spicy, citrus foods. Avoid caffeine and carbonated beverages. Avoid chocolate. Try eating 4-6 small meals a day rather than 3 large meals. Do not eat within 3 hours of laying down. Prop head of bed up on wood or bricks to create a 6 inch incline.  Continue 1 tablespoon of fiber daily.  Will plan to follow-up in 1 year, sooner if needed.  Please not hesitate to call if you have any questions or concerns.  It was a pleasure to see you today. I want to create trusting relationships with patients. If you receive a survey regarding your visit,  I greatly appreciate you taking time to fill this out on paper or through your MyChart. I value your feedback.  Brooke Bonito, MSN, FNP-BC, AGACNP-BC [facility]

## 2022-12-15 ENCOUNTER — Ambulatory Visit: Payer: Medicare Other | Admitting: Internal Medicine

## 2023-01-17 ENCOUNTER — Other Ambulatory Visit: Payer: Self-pay | Admitting: Cardiology

## 2023-01-17 DIAGNOSIS — I5032 Chronic diastolic (congestive) heart failure: Secondary | ICD-10-CM

## 2023-01-19 ENCOUNTER — Other Ambulatory Visit: Payer: Self-pay | Admitting: Cardiology

## 2023-01-19 DIAGNOSIS — E782 Mixed hyperlipidemia: Secondary | ICD-10-CM

## 2023-02-08 ENCOUNTER — Other Ambulatory Visit: Payer: Self-pay | Admitting: Cardiology

## 2023-02-08 DIAGNOSIS — E782 Mixed hyperlipidemia: Secondary | ICD-10-CM

## 2023-04-01 ENCOUNTER — Other Ambulatory Visit: Payer: Self-pay | Admitting: Cardiology

## 2023-04-01 DIAGNOSIS — I5032 Chronic diastolic (congestive) heart failure: Secondary | ICD-10-CM

## 2023-04-20 ENCOUNTER — Ambulatory Visit: Payer: Self-pay | Admitting: Cardiology

## 2023-07-06 LAB — MAGNESIUM: Magnesium: 1.9 mg/dL (ref 1.6–2.3)

## 2023-07-06 LAB — BASIC METABOLIC PANEL
BUN/Creatinine Ratio: 17 (ref 10–24)
BUN: 20 mg/dL (ref 8–27)
CO2: 21 mmol/L (ref 20–29)
Calcium: 9.6 mg/dL (ref 8.6–10.2)
Chloride: 106 mmol/L (ref 96–106)
Creatinine, Ser: 1.19 mg/dL (ref 0.76–1.27)
Glucose: 93 mg/dL (ref 70–99)
Potassium: 4.4 mmol/L (ref 3.5–5.2)
Sodium: 143 mmol/L (ref 134–144)
eGFR: 61 mL/min/{1.73_m2} (ref >59)

## 2023-07-09 ENCOUNTER — Ambulatory Visit: Payer: Medicare Other | Attending: Cardiology | Admitting: Cardiology

## 2023-07-09 ENCOUNTER — Ambulatory Visit (INDEPENDENT_AMBULATORY_CARE_PROVIDER_SITE_OTHER): Payer: Medicare Other

## 2023-07-09 VITALS — BP 120/76 | HR 66 | Resp 16 | Ht 70.0 in | Wt 280.8 lb

## 2023-07-09 DIAGNOSIS — I493 Ventricular premature depolarization: Secondary | ICD-10-CM | POA: Diagnosis present

## 2023-07-09 DIAGNOSIS — I2510 Atherosclerotic heart disease of native coronary artery without angina pectoris: Secondary | ICD-10-CM | POA: Insufficient documentation

## 2023-07-09 DIAGNOSIS — Z6841 Body Mass Index (BMI) 40.0 and over, adult: Secondary | ICD-10-CM | POA: Insufficient documentation

## 2023-07-09 DIAGNOSIS — I2584 Coronary atherosclerosis due to calcified coronary lesion: Secondary | ICD-10-CM | POA: Diagnosis present

## 2023-07-09 DIAGNOSIS — E782 Mixed hyperlipidemia: Secondary | ICD-10-CM | POA: Diagnosis present

## 2023-07-09 DIAGNOSIS — I700 Atherosclerosis of aorta: Secondary | ICD-10-CM | POA: Insufficient documentation

## 2023-07-09 DIAGNOSIS — I10 Essential (primary) hypertension: Secondary | ICD-10-CM | POA: Insufficient documentation

## 2023-07-09 DIAGNOSIS — E66813 Obesity, class 3: Secondary | ICD-10-CM | POA: Diagnosis present

## 2023-07-09 DIAGNOSIS — R0609 Other forms of dyspnea: Secondary | ICD-10-CM | POA: Insufficient documentation

## 2023-07-09 DIAGNOSIS — I5032 Chronic diastolic (congestive) heart failure: Secondary | ICD-10-CM | POA: Insufficient documentation

## 2023-07-09 MED ORDER — HYDRALAZINE HCL 50 MG PO TABS: 50.0000 mg | ORAL_TABLET | Freq: Two times a day (BID) | ORAL | 3 refills | Status: AC

## 2023-07-09 MED ORDER — ISOSORBIDE MONONITRATE ER 60 MG PO TB24: 60.0000 mg | ORAL_TABLET | Freq: Every day | ORAL | 3 refills | Status: DC

## 2023-07-09 NOTE — Progress Notes (Signed)
 Cardiology Office Note:  .   ID:  [name], DOB 02-12-1941, MRN [id number] PCP:  Joella Sieving, DO  Former Cardiology Providers: NA [facility] Providers Cardiologist:  Madonna Large, DO , [facility] (established care 12/05/2021) Electrophysiologist:  None  Click to update primary MD,subspecialty MD or APP then REFRESH:1}    Chief Complaint  Patient presents with   heart failure management   Follow-up    History of Present Illness: .   [name] is a 83 y.o. Caucasian male whose past medical history and cardiovascular risk factors includes: Chronic HFpEF, hypertension, hyperlipidemia, sleep apnea (follows with [facility]) history of COVID-19 infection, BPH, obesity due to excess calories, hypothyroidism, advanced age.  Patient being followed practice for HFpEF management.  Clinically denies any anginal chest pain or heart failure symptoms.  Since last office visit he was diagnosed with sleep apnea and is currently being compliant with CPAP, managed by [facility].  He has done well with the addition of spironolactone .  He states that isosorbide  is becoming cost prohibitive ($76 for 30-day supply).  He also has some lower extremity swelling but denies orthopnea or PND.  Review of Systems: .   Review of Systems  Cardiovascular:  Negative for chest pain, claudication, irregular heartbeat, leg swelling, near-syncope, orthopnea, palpitations, paroxysmal nocturnal dyspnea and syncope.  Respiratory:  Negative for shortness of breath.   Hematologic/Lymphatic: Negative for bleeding problem.    Studies Reviewed:   EKG: EKG Interpretation Date/Time:  Friday July 09 2023 10:23:33 EST Ventricular Rate:  65 PR Interval:  192 QRS Duration:  80 QT Interval:  394 QTC Calculation: 409 R Axis:   65  Text Interpretation: Sinus rhythm with Premature atrial complexes Nonspecific T wave abnormality When compared with ECG of 10-Jul-2015 10:56, Premature atrial complexes are now  Present Confirmed by Large Madonna [phone]) on 07/09/2023 10:31:21 AM  Echocardiogram: 09/25/2015: Technically difficult study, LVEF 60 to 65%, grade 1 diastolic impairment, mild MR, moderate LAE, liver cyst 4 x 5.5 cm.   12/18/2021: Left ventricle cavity is normal in size. Mild concentric hypertrophy of the left ventricle. Normal global wall motion. Normal LV systolic function with EF 62%. Doppler evidence of grade I (impaired) diastolic dysfunction, normal LAP.  The aortic root is borderline dilated at 3.9 cm. Left atrial cavity is mildly dilated. Aneurysmal interatrial septum without 2D or color Doppler evidence of shunting. Mild to moderate mitral regurgitation. Mild tricuspid regurgitation. Mild pulmonic regurgitation. IVC is not seen.  Incidental finding of large echolucent structure in lives, possible cyst. Recommend clinical correlation.  Stress Testing: Lexiscan  stress test September 2023: Low restudy  Coronary CTA. October 11, 2022 1. Total coronary calcium  score of 74.7. This was 18th percentile for age and sex matched control.  2. Normal coronary origin with right dominance.  3. CAD-RADS = 1.  Left Main: Patent with minimal calcified plaque within the distal segment.  LAD: Minimal calcified plaque within the proximal and mid LAD without stenosis, otherwise vessel is patent.  LCx: Patent.  RCA: Patent.  4. Scattered aortic atherosclerosis.  5. Quality of scan is limited due to signal to noise and mis-registration artifact.  6. Scattered aortic atherosclerosis.  Noncardiac findings:  Aortic Atherosclerosis (ICD10-I70.0). Hypodensities within the liver likely representing cysts. Ultrasound could be helpful for further evaluation.  RECOMMENDATIONS: Consider non-atherosclerotic causes of chest pain. Consider preventive therapy and risk factor modification.   RADIOLOGY: N/A  Risk Assessment/Calculations:   NA   Labs:  Latest Ref Rng & Units 07/10/2015   11:00  AM 05/05/2014    4:55 AM 04/27/2014    9:35 AM  CBC  WBC 4.0 - 10.5 K/uL 8.5  14.2  6.8   Hemoglobin 13.0 - 17.0 g/dL 85.6  88.4  85.9   Hematocrit 39.0 - 52.0 % 42.1  34.8  41.6   Platelets 150 - 400 K/uL 181  193  206        Latest Ref Rng & Units 07/05/2023   11:47 AM 10/01/2022   10:17 AM 04/15/2022    9:31 AM  BMP  Glucose 70 - 99 mg/dL 93  95  899   BUN 8 - 27 mg/dL 20  23  19    Creatinine 0.76 - 1.27 mg/dL 8.80  8.60  8.66   BUN/Creat Ratio 10 - 24 17  17  14    Sodium 134 - 144 mmol/L 143  143  144   Potassium 3.5 - 5.2 mmol/L 4.4  4.2  4.2   Chloride 96 - 106 mmol/L 106  103  104   CO2 20 - 29 mmol/L 21  24  27    Calcium  8.6 - 10.2 mg/dL 9.6  9.8  9.8       Latest Ref Rng & Units 07/05/2023   11:47 AM 10/01/2022   10:17 AM 04/15/2022    9:31 AM  CMP  Glucose 70 - 99 mg/dL 93  95  899   BUN 8 - 27 mg/dL 20  23  19    Creatinine 0.76 - 1.27 mg/dL 8.80  8.60  8.66   Sodium 134 - 144 mmol/L 143  143  144   Potassium 3.5 - 5.2 mmol/L 4.4  4.2  4.2   Chloride 96 - 106 mmol/L 106  103  104   CO2 20 - 29 mmol/L 21  24  27    Calcium  8.6 - 10.2 mg/dL 9.6  9.8  9.8     Lab Results  Component Value Date   CHOL 139 10/01/2022   HDL 31 (L) 10/01/2022   LDLCALC 87 10/01/2022   LDLDIRECT 87 10/01/2022   TRIG 113 10/01/2022   CHOLHDL 4.1 11/27/2020   No results for input(s): LIPOA in the last 8760 hours. No components found for: NTPROBNP Recent Labs    10/01/22 1017  PROBNP 107   No results for input(s): TSH in the last 8760 hours.  Date Collected: 11/11/2021 , information obtained by referring physician BNP 126 Potassium: 4.0 Creatinine 1.15 mg/dL. eGFR: 64 mL/min per 1.73 m Hemoglobin: 13.8 g/dL and hematocrit: 59.6 % Lipid profile: Total cholesterol 168 , triglycerides 190 , HDL 36 , LDL 101 non-HDL 132 AST: 18 , ALT: 19 , alkaline phosphatase: 72  TSH: 3.93    Physical Exam:    Today's Vitals   07/09/23 1018  BP: 120/76  Pulse: 66  Resp: 16   SpO2: 92%  Weight: 280 lb 12.8 oz (127.4 kg)  Height: 5' 10 (1.778 m)   Body mass index is 40.29 kg/m. Wt Readings from Last 3 Encounters:  07/09/23 280 lb 12.8 oz (127.4 kg)  10/19/22 280 lb 9.6 oz (127.3 kg)  09/29/22 283 lb 6.4 oz (128.5 kg)    Physical Exam  Constitutional: No distress.  Age appropriate, hemodynamically stable.   Neck: No JVD present.  Cardiovascular: Normal rate, regular rhythm, S1 normal, S2 normal, intact distal pulses and normal pulses. Exam reveals no gallop, no S3 and no S4.  No murmur heard. Pulses:  Dorsalis pedis pulses are 2+ on the right side and 2+ on the left side.       Posterior tibial pulses are 2+ on the right side and 2+ on the left side.  Pulmonary/Chest: Effort normal and breath sounds normal. No stridor. He has no wheezes. He has no rales.  Abdominal: Soft. Bowel sounds are normal. He exhibits no distension. There is no abdominal tenderness.  Musculoskeletal:        General: No edema.     Cervical back: Neck supple.  Neurological: He is alert and oriented to person, place, and time. He has intact cranial nerves (2-12).  Skin: Skin is warm and moist.     Impression & Recommendation(s):  Impression:   ICD-10-CM   1. Chronic heart failure with preserved ejection fraction (HFpEF) (HCC)  I50.32 EKG 12-Lead    2. Dyspnea on exertion  R06.09     3. Coronary atherosclerosis due to calcified coronary lesion  I25.10    I25.84     4. Atherosclerosis of aorta (HCC)  I70.0     5. PVC's (premature ventricular contractions)  I49.3 LONG TERM MONITOR (3-14 DAYS)    6. Benign hypertension  I10 hydrALAZINE  (APRESOLINE ) 50 MG tablet    isosorbide  mononitrate (IMDUR ) 60 MG 24 hr tablet    7. Mixed hyperlipidemia  E78.2     8. Class 3 severe obesity due to excess calories with serious comorbidity and body mass index (BMI) of 40.0 to 44.9 in adult [facility])  Z33.186    E66.01    Z68.41        Recommendation(s):  Chronic heart failure with  preserved ejection fraction (HFpEF) (HCC) Stage B, NYHA class II Echo: Preserved LV, grade 1 diastolic dysfunction, mild to moderate valvular heart disease Total CAC 74.7, 18th percentile Stress MPI: Low risk Patient has initiated CPAP since last office visit and follows with [facility] Entresto  was cost prohibitive in the past Continue Diovan /HCTZ. Continue spironolactone . Transition hydralazine  50 mg p.o. daily to twice daily dosing. Stop amlodipine  due to lower extremity swelling. Will change isosorbide  dinitrate to mononitrate (dinitrate was becoming cost prohibitive) Reemphasized the importance of secondary prevention with focus on improving her modifiable cardiovascular risk factors such as glycemic control, lipid management, blood pressure control, weight loss.  Dyspnea on exertion Multifactorial: HFpEF, obesity, OSA, clinical OHS, deconditioning HFpEF management as discussed above Sleep apnea being addressed by [facility] The remainder of the workup per primary team  Coronary atherosclerosis due to calcified coronary lesion Atherosclerosis of aorta (HCC) Continue statin therapy. Recommended aspirin  81 mg p.o. daily. Has undergone appropriate ischemic workup as outlined above  PVC's (premature ventricular contractions) Cardiac monitor to evaluate for PVC burden  Benign hypertension Office blood pressures are well-controlled. Continue Diovan /HCTZ 320/12.5 mg p.o. daily Continue spironolactone  25 mg p.o. every morning Discontinue amlodipine  10 mg p.o. daily Increase hydralazine  to 50 mg p.o. daily to twice daily dosing Change isosorbide  dinitrate 20 mg p.o. 3 times daily to Imdur  60 mg p.o. daily Reemphasized importance of low-salt diet.  Mixed hyperlipidemia Currently on atorvastatin  40 mg p.o. daily.   He denies myalgia or other side effects.  Class 3 severe obesity due to excess calories with serious comorbidity and body mass index (BMI) of 40.0 to 44.9 in  adult [facility]) Body mass index is 40.29 kg/m. I reviewed with him importance of diet, regular physical activity/exercise, weight loss.   Patient is educated on the importance of increasing physical activity gradually as tolerated with  a goal of moderate intensity exercise for 30 minutes a day 5 days a week.  Orders Placed:  Orders Placed This Encounter  Procedures   LONG TERM MONITOR (3-14 DAYS)    Standing Status:   Future    Number of Occurrences:   1    Expected Date:   07/16/2023    Expiration Date:   07/08/2024    Where should this test be performed?:   [facility]    Does the patient have an implanted cardiac device?:   No    Prescribed days of wear:   3    Type of enrollment:   Home Enrollment    Vendor::   Zio   EKG 12-Lead    Final Medication List:    Meds ordered this encounter  Medications   hydrALAZINE  (APRESOLINE ) 50 MG tablet    Sig: Take 1 tablet (50 mg total) by mouth in the morning and at bedtime.    Dispense:  180 tablet    Refill:  3    Changing frequency   isosorbide  mononitrate (IMDUR ) 60 MG 24 hr tablet    Sig: Take 1 tablet (60 mg total) by mouth daily.    Dispense:  90 tablet    Refill:  3    Medications Discontinued During This Encounter  Medication Reason   isosorbide  dinitrate (ISORDIL ) 20 MG tablet Discontinued by provider   hydrALAZINE  (APRESOLINE ) 50 MG tablet      Current Outpatient Medications:    allopurinol (ZYLOPRIM) 100 MG tablet, Take 100 mg by mouth daily as needed (gout)., Disp: , Rfl:    amLODipine  (NORVASC ) 10 MG tablet, Take 1 tablet by mouth daily., Disp: , Rfl:    atorvastatin  (LIPITOR) 20 MG tablet, TAKE 1 TABLET BY MOUTH EVERYDAY AT BEDTIME, Disp: 84 tablet, Rfl: 1   Cholecalciferol (VITAMIN D3) 125 MCG (5000 UT) CAPS, Take 5,000 Units by mouth every evening., Disp: , Rfl:    isosorbide  mononitrate (IMDUR ) 60 MG 24 hr tablet, Take 1 tablet (60 mg total) by mouth daily., Disp: 90 tablet, Rfl: 3   levothyroxine  (SYNTHROID ) 150  MCG tablet, Take 1 tablet (150 mcg total) by mouth daily before breakfast., Disp: 90 tablet, Rfl: 3   omeprazole  (PRILOSEC ) 40 MG capsule, Take 1 capsule (40 mg total) by mouth daily. TAKE 1 CAPSULE BY MOUTH EVERY DAY, Disp: 90 capsule, Rfl: 3   spironolactone  (ALDACTONE ) 25 MG tablet, Take 1 tablet (25 mg total) by mouth in the morning., Disp: 90 tablet, Rfl: 1   tamsulosin  (FLOMAX ) 0.4 MG CAPS capsule, Take 0.4 mg by mouth at bedtime. , Disp: , Rfl:    valsartan -hydrochlorothiazide  (DIOVAN -HCT) 320-12.5 MG tablet, Take 1 tablet by mouth daily., Disp: , Rfl:    vitamin C (ASCORBIC ACID) 500 MG tablet, Take 500 mg by mouth at bedtime., Disp: , Rfl:    hydrALAZINE  (APRESOLINE ) 50 MG tablet, Take 1 tablet (50 mg total) by mouth in the morning and at bedtime., Disp: 180 tablet, Rfl: 3  Consent:   NA  Disposition:   6 months sooner if needed Patient may be asked to follow-up sooner based on the results of the above-mentioned testing.  His questions and concerns were addressed to his satisfaction. He voices understanding of the recommendations provided during this encounter.    Signed, Madonna Large, DO, [facility]   [facility]  [address] [place], [place] [address] 07/18/2023 10:24 PM

## 2023-07-09 NOTE — Progress Notes (Unsigned)
Enrolled patient for a 3 day Zio XT Monitor to be mailed to patients home.  °

## 2023-07-09 NOTE — Patient Instructions (Signed)
 Medication Instructions:  Your physician has recommended you make the following change in your medication:   STOP Amlodipine   STOP Isosorbide -Dinitrate  START Isosorbide -Mononitrate 60 mg once daily  CHANGE Hydralazine  50 mg to twice daily   *If you need a refill on your cardiac medications before your next appointment, please call your pharmacy*  Lab Work: None ordered today. If you have labs (blood work) drawn today and your tests are completely normal, you will receive your results only by: MyChart Message (if you have MyChart) OR A paper copy in the mail If you have any lab test that is abnormal or we need to change your treatment, we will call you to review the results.  Testing/Procedures: Your physician has requested that you wear a Zio heart monitor for 3 days. This will be mailed to your home with instructions on how to apply the monitor and how to return it when finished. Please allow 2 weeks after returning the heart monitor before our office calls you with the results.   Follow-Up: At [facility], you and your health needs are our priority.  As part of our continuing mission to provide you with exceptional heart care, we have created designated Provider Care Teams.  These Care Teams include your primary Cardiologist (physician) and Advanced Practice Providers (APPs -  Physician Assistants and Nurse Practitioners) who all work together to provide you with the care you need, when you need it.  We recommend signing up for the patient portal called MyChart.  Sign up information is provided on this After Visit Summary.  MyChart is used to connect with patients for Virtual Visits (Telemedicine).  Patients are able to view lab/test results, encounter notes, upcoming appointments, etc.  Non-urgent messages can be sent to your provider as well.   To learn more about what you can do with MyChart, go to [email or web address].    Your next appointment:   6 month(s)  The  format for your next appointment:   In Person  Provider:   Madonna Large, DO {  Other Instructions ZIO XT- Long Term Monitor Instructions     Your physician has requested you wear a ZIO patch monitor for 3 days.  This is a single patch monitor. [employer] supplies one patch monitor per enrollment. Additional  stickers are not available. Please do not apply patch if you will be having a Nuclear Stress Test,  Echocardiogram, Cardiac CT, MRI, or Chest Xray during the period you would be wearing the  monitor. The patch cannot be worn during these tests. You cannot remove and re-apply the  ZIO XT patch monitor.  Your ZIO patch monitor will be mailed 3 day USPS to your address on file. It may take 3-5 days  to receive your monitor after you have been enrolled.  Once you have received your monitor, please review the enclosed instructions. Your monitor  has already been registered assigning a specific monitor serial # to you.     Billing and Patient Assistance Program Information     We have supplied [employer] with any of your insurance information on file for billing purposes.  [employer] offers a sliding scale Patient Assistance Program for patients that do not have  insurance, or whose insurance does not completely cover the cost of the ZIO monitor.  You must apply for the Patient Assistance Program to qualify for this discounted rate.  To apply, please call [employer] at [phone], select option 4, select option 2, ask to apply for  Patient Assistance  Program. Meredeth will ask your household income, and how many people  are in your household. They will quote your out-of-pocket cost based on that information.  [employer] will also be able to set up a 72-month, interest-free payment plan if needed.     Applying the monitor     Shave hair from upper left chest.  Hold abrader disc by orange tab. Rub abrader in 40 strokes over the upper left chest as  indicated in your monitor instructions.  Clean area  with 4 enclosed alcohol pads. Let dry.  Apply patch as indicated in monitor instructions. Patch will be placed under collarbone on left  side of chest with arrow pointing upward.  Rub patch adhesive wings for 2 minutes. Remove white label marked 1. Remove the white  label marked 2. Rub patch adhesive wings for 2 additional minutes.  While looking in a mirror, press and release button in center of patch. A small green light will  flash 3-4 times. This will be your only indicator that the monitor has been turned on.  Do not shower for the first 24 hours. You may shower after the first 24 hours.  Press the button if you feel a symptom. You will hear a small click. Record Date, Time and  Symptom in the Patient Logbook.  When you are ready to remove the patch, follow instructions on the last 2 pages of Patient  Logbook. Stick patch monitor onto the last page of Patient Logbook.  Place Patient Logbook in the blue and white box. Use locking tab on box and tape box closed  securely. The blue and white box has prepaid postage on it. Please place it in the mailbox as  soon as possible. Your physician should have your test results approximately 7 days after the  monitor has been mailed back to [facility].  Call [facility] Customer Care at [phone] if you have questions regarding  your ZIO XT patch monitor. Call them immediately if you see an orange light blinking on your  monitor.  If your monitor falls off in less than 4 days, contact our Monitor department at [phone].  If your monitor becomes loose or falls off after 4 days call [employer] at [phone] for  suggestions on securing your monitor.

## 2023-07-12 DIAGNOSIS — I493 Ventricular premature depolarization: Secondary | ICD-10-CM

## 2023-07-18 ENCOUNTER — Encounter: Payer: Self-pay | Admitting: Cardiology

## 2023-07-22 ENCOUNTER — Other Ambulatory Visit: Payer: Self-pay | Admitting: Cardiology

## 2023-07-22 DIAGNOSIS — E782 Mixed hyperlipidemia: Secondary | ICD-10-CM

## 2023-08-05 ENCOUNTER — Other Ambulatory Visit: Payer: Self-pay

## 2023-08-05 MED ORDER — METOPROLOL SUCCINATE ER 25 MG PO TB24: 25.0000 mg | ORAL_TABLET | Freq: Every day | ORAL | 3 refills | Status: DC

## 2023-08-18 NOTE — Telephone Encounter (Signed)
Pt returning call

## 2023-08-24 ENCOUNTER — Other Ambulatory Visit: Payer: Self-pay | Admitting: Cardiology

## 2023-08-24 DIAGNOSIS — I5032 Chronic diastolic (congestive) heart failure: Secondary | ICD-10-CM

## 2023-09-29 ENCOUNTER — Other Ambulatory Visit: Payer: Self-pay | Admitting: Internal Medicine

## 2023-09-29 DIAGNOSIS — D126 Benign neoplasm of colon, unspecified: Secondary | ICD-10-CM

## 2023-09-29 DIAGNOSIS — K2270 Barrett's esophagus without dysplasia: Secondary | ICD-10-CM

## 2023-10-31 ENCOUNTER — Other Ambulatory Visit: Payer: Self-pay | Admitting: Cardiology

## 2023-11-23 ENCOUNTER — Encounter: Payer: Self-pay | Admitting: Gastroenterology

## 2023-12-31 ENCOUNTER — Ambulatory Visit: Payer: Medicare Other | Attending: Cardiology | Admitting: Cardiology

## 2023-12-31 ENCOUNTER — Encounter: Payer: Self-pay | Admitting: Cardiology

## 2023-12-31 VITALS — BP 148/68 | HR 54 | Resp 16 | Ht 70.0 in | Wt 277.4 lb

## 2023-12-31 DIAGNOSIS — I2510 Atherosclerotic heart disease of native coronary artery without angina pectoris: Secondary | ICD-10-CM | POA: Insufficient documentation

## 2023-12-31 DIAGNOSIS — I700 Atherosclerosis of aorta: Secondary | ICD-10-CM | POA: Diagnosis present

## 2023-12-31 DIAGNOSIS — I10 Essential (primary) hypertension: Secondary | ICD-10-CM | POA: Diagnosis present

## 2023-12-31 DIAGNOSIS — E782 Mixed hyperlipidemia: Secondary | ICD-10-CM | POA: Insufficient documentation

## 2023-12-31 DIAGNOSIS — E66812 Obesity, class 2: Secondary | ICD-10-CM | POA: Diagnosis present

## 2023-12-31 DIAGNOSIS — I5032 Chronic diastolic (congestive) heart failure: Secondary | ICD-10-CM | POA: Insufficient documentation

## 2023-12-31 DIAGNOSIS — I2584 Coronary atherosclerosis due to calcified coronary lesion: Secondary | ICD-10-CM | POA: Diagnosis present

## 2023-12-31 DIAGNOSIS — Z6839 Body mass index (BMI) 39.0-39.9, adult: Secondary | ICD-10-CM | POA: Insufficient documentation

## 2023-12-31 DIAGNOSIS — R0609 Other forms of dyspnea: Secondary | ICD-10-CM | POA: Insufficient documentation

## 2023-12-31 DIAGNOSIS — I493 Ventricular premature depolarization: Secondary | ICD-10-CM | POA: Diagnosis present

## 2023-12-31 NOTE — Progress Notes (Signed)
 Cardiology Office Note:  .   ID:  [name], DOB 15-Oct-1940, MRN [id number] PCP:  Joella Sieving, DO  Former Cardiology Providers: NA [facility] Providers Cardiologist:  Madonna Large, DO , [facility] (established care 12/05/2021) Electrophysiologist:  None  Click to update primary MD,subspecialty MD or APP then REFRESH:1}    Chief Complaint  Patient presents with   Chronic heart failure with preserved ejection fraction   Follow-up    History of Present Illness: .   [name] is a 83 y.o. Caucasian male whose past medical history and cardiovascular risk factors includes: Chronic HFpEF, hypertension, hyperlipidemia, sleep apnea now on BiPAP (follows with [facility]) history of COVID-19 infection, BPH, obesity due to excess calories, hypothyroidism, advanced age.  Patient being followed practice for HFpEF management.  Patient presents today for 1-month follow-up visit.  At the last office visit in January 2025 patient's hydralazine  dose was increased to twice daily, and amlodipine  was discontinued due to lower extremity swelling.  Patient was also noted to have frequent PVCs on his EKG and to further evaluate the burden cardiac monitor was recommended.  Zio patch noted an average heart rate of 60 bpm, total supraventricular ectopic burden 10.3%, with rare episodes of PSVT and atrial tachycardia.  Patient was started on low-dose Toprol -XL.  Had received documentation from Dr. Harl dated October 25, 2023 which in summary states that the patient's CPAP compliance is 100% but due to residual obstructive and central apnea transitioning to BiPAP.  Denies anginal chest pain. Shortness of breath is present at times-can occur at rest or with effort related activities.  Symptoms are short-lived and self-limited. No change in overall physical endurance. Since starting metoprolol  patient states that he has more energy.  Ventricular rate on his blood pressure log are acceptable. Patient  brought in CT results of this chest were performed in [place] under the care of his PCP-results reviewed which mentioned findings of mild emphysematous changes. Outside labs from 12/10/2023 provided by the patient also independently reviewed as part of today's encounter. Home blood pressure log reviewed.    Review of Systems: .   Review of Systems  Cardiovascular:  Negative for chest pain, claudication, irregular heartbeat, leg swelling, near-syncope, orthopnea, palpitations, paroxysmal nocturnal dyspnea and syncope.  Respiratory:  Negative for shortness of breath.   Hematologic/Lymphatic: Negative for bleeding problem.    Studies Reviewed:   EKG: EKG Interpretation Date/Time:  Friday December 31 2023 08:50:09 EDT Ventricular Rate:  51 PR Interval:  228 QRS Duration:  84 QT Interval:  426 QTC Calculation: 392 R Axis:   77  Text Interpretation: Sinus bradycardia with 1st degree A-V block When compared with ECG of 09-Jul-2023 10:23, Premature atrial complexes are no longer Present PR interval has increased Nonspecific T wave abnormality, improved in Inferior leads Nonspecific T wave abnormality no longer evident in Lateral leads Confirmed by Large Madonna [phone]) on 12/31/2023 9:05:23 AM  Echocardiogram: 09/25/2015: Technically difficult study, LVEF 60 to 65%, grade 1 diastolic impairment, mild MR, moderate LAE, liver cyst 4 x 5.5 cm.   12/18/2021: Left ventricle cavity is normal in size. Mild concentric hypertrophy of the left ventricle. Normal global wall motion. Normal LV systolic function with EF 62%. Doppler evidence of grade I (impaired) diastolic dysfunction, normal LAP.  The aortic root is borderline dilated at 3.9 cm. Left atrial cavity is mildly dilated. Aneurysmal interatrial septum without 2D or color Doppler evidence of shunting. Mild to moderate mitral regurgitation. Mild tricuspid regurgitation. Mild pulmonic  regurgitation. IVC is not seen.  Incidental finding of large  echolucent structure in lives, possible cyst. Recommend clinical correlation.  Stress Testing: Lexiscan  stress test September 2023: Low restudy  Coronary CTA. October 11, 2022 1. Total coronary calcium  score of 74.7. This was 18th percentile for age and sex matched control.  2. Normal coronary origin with right dominance.  3. CAD-RADS = 1.  Left Main: Patent with minimal calcified plaque within the distal segment.  LAD: Minimal calcified plaque within the proximal and mid LAD without stenosis, otherwise vessel is patent.  LCx: Patent.  RCA: Patent.  4. Scattered aortic atherosclerosis.  5. Quality of scan is limited due to signal to noise and mis-registration artifact.  6. Scattered aortic atherosclerosis.  Noncardiac findings:  Aortic Atherosclerosis (ICD10-I70.0). Hypodensities within the liver likely representing cysts. Ultrasound could be helpful for further evaluation.  RECOMMENDATIONS: Consider non-atherosclerotic causes of chest pain. Consider preventive therapy and risk factor modification.   Cardiac monitor (Zio Patch): July 12, 2023-July 21, 2023 Dominant rhythm sinus, Bradycardia burden (27%) Heart rate 35-150 bpm. Avg HR 60 bpm. No atrial fibrillation detected during the monitoring period. No high grade AV block, pauses (3 seconds or longer). Total supraventricular ectopic burden Isolated SVEs were frequent (6.3%), SVE Couplets were occasional (4.0%), and SVE Triplets were rare (<1.0%). Rare asymptomatic episodes of PSVT/atrial tachycardia. The fastest episode was 6 beats, 3.1 seconds, average HR 135 bpm, max HR 150 bpm Total ventricular ectopic burden <1%. Patient triggered events: 0.   RADIOLOGY: N/A  Risk Assessment/Calculations:   NA   Labs:       Latest Ref Rng & Units 07/10/2015   11:00 AM 05/05/2014    4:55 AM 04/27/2014    9:35 AM  CBC  WBC 4.0 - 10.5 K/uL 8.5  14.2  6.8   Hemoglobin 13.0 - 17.0 g/dL 85.6  88.4  85.9   Hematocrit 39.0 -  52.0 % 42.1  34.8  41.6   Platelets 150 - 400 K/uL 181  193  206        Latest Ref Rng & Units 07/05/2023   11:47 AM 10/01/2022   10:17 AM 04/15/2022    9:31 AM  BMP  Glucose 70 - 99 mg/dL 93  95  899   BUN 8 - 27 mg/dL 20  23  19    Creatinine 0.76 - 1.27 mg/dL 8.80  8.60  8.66   BUN/Creat Ratio 10 - 24 17  17  14    Sodium 134 - 144 mmol/L 143  143  144   Potassium 3.5 - 5.2 mmol/L 4.4  4.2  4.2   Chloride 96 - 106 mmol/L 106  103  104   CO2 20 - 29 mmol/L 21  24  27    Calcium  8.6 - 10.2 mg/dL 9.6  9.8  9.8       Latest Ref Rng & Units 07/05/2023   11:47 AM 10/01/2022   10:17 AM 04/15/2022    9:31 AM  CMP  Glucose 70 - 99 mg/dL 93  95  899   BUN 8 - 27 mg/dL 20  23  19    Creatinine 0.76 - 1.27 mg/dL 8.80  8.60  8.66   Sodium 134 - 144 mmol/L 143  143  144   Potassium 3.5 - 5.2 mmol/L 4.4  4.2  4.2   Chloride 96 - 106 mmol/L 106  103  104   CO2 20 - 29 mmol/L 21  24  27    Calcium  8.6 -  10.2 mg/dL 9.6  9.8  9.8     Lab Results  Component Value Date   CHOL 139 10/01/2022   HDL 31 (L) 10/01/2022   LDLCALC 87 10/01/2022   LDLDIRECT 87 10/01/2022   TRIG 113 10/01/2022   CHOLHDL 4.1 11/27/2020   No results for input(s): LIPOA in the last 8760 hours. No components found for: NTPROBNP No results for input(s): PROBNP in the last 8760 hours.  No results for input(s): TSH in the last 8760 hours.  Date Collected: 11/11/2021 , information obtained by referring physician BNP 126 Potassium: 4.0 Creatinine 1.15 mg/dL. eGFR: 64 mL/min per 1.73 m Hemoglobin: 13.8 g/dL and hematocrit: 59.6 % Lipid profile: Total cholesterol 168 , triglycerides 190 , HDL 36 , LDL 101 non-HDL 132 AST: 18 , ALT: 19 , alkaline phosphatase: 72  TSH: 3.93    External Labs: Collected: December 10, 2023 provided by the patient performed at PCPs office. Hemoglobin A1c 6.3. Hemoglobin 12.1, hematocrit 37.2%. BUN 21, creatinine 1.57. EGFR 44. Sodium 144, potassium 4.5, chloride 107, bicarb  26. AST, ALT, alkaline phosphatase within normal limits. TSH 3.42. Total cholesterol 112, triglycerides 105, HDL 31, LDL 62, non-HDL 81.  Physical Exam:    Today's Vitals   12/31/23 0847  BP: (!) 148/68  Pulse: (!) 54  Resp: 16  SpO2: 95%  Weight: 277 lb 6.4 oz (125.8 kg)  Height: 5' 10 (1.778 m)    Body mass index is 39.8 kg/m. Wt Readings from Last 3 Encounters:  12/31/23 277 lb 6.4 oz (125.8 kg)  07/09/23 280 lb 12.8 oz (127.4 kg)  10/19/22 280 lb 9.6 oz (127.3 kg)    Physical Exam  Constitutional: No distress.  Age appropriate, hemodynamically stable.   Neck: No JVD present.  Cardiovascular: Regular rhythm, S1 normal, S2 normal, intact distal pulses and normal pulses. Bradycardia present. Exam reveals no gallop, no S3 and no S4.  No murmur heard. Pulses:      Dorsalis pedis pulses are 2+ on the right side and 2+ on the left side.       Posterior tibial pulses are 2+ on the right side and 2+ on the left side.  Pulmonary/Chest: Effort normal and breath sounds normal. No stridor. He has no wheezes. He has no rales.  Abdominal: Soft. Bowel sounds are normal. He exhibits no distension. There is no abdominal tenderness.  Musculoskeletal:        General: No edema.     Cervical back: Neck supple.  Neurological: He is alert and oriented to person, place, and time. He has intact cranial nerves (2-12).  Skin: Skin is warm and moist.     Impression & Recommendation(s):  Impression:   ICD-10-CM   1. Chronic heart failure with preserved ejection fraction (HFpEF) (HCC)  I50.32 EKG 12-Lead    2. Dyspnea on exertion  R06.09     3. Coronary atherosclerosis due to calcified coronary lesion  I25.10    I25.84     4. Atherosclerosis of aorta (HCC)  I70.0     5. PVC's (premature ventricular contractions)  I49.3     6. Benign hypertension  I10     7. Mixed hyperlipidemia  E78.2     8. Class 2 severe obesity due to excess calories with serious comorbidity and body mass index  (BMI) of 39.0 to 39.9 in adult [facility])  Z33.187    E66.01    Z68.39         Recommendation(s):  Chronic heart failure with  preserved ejection fraction (HFpEF) (HCC) Stage B, NYHA class II Echo: Preserved LV, grade 1 diastolic dysfunction, mild to moderate valvular heart disease Total CAC 74.7, 18th percentile Stress MPI: Low risk Entresto  was cost prohibitive in the past Continue Diovan /HCTZ. Continue spironolactone . Continue hydralazine  100 mg p.o. twice daily  Continue Imdur  60 mg p.o. daily, isosorbide  dinitrate was cost prohibitive  Since last office visit he has been transition from CPAP to BiPAP and endorses better sleep hygiene Reemphasized the importance of secondary prevention with focus on improving her modifiable cardiovascular risk factors such as glycemic control, lipid management, blood pressure control, weight loss.  Dyspnea on exertion Multifactorial: HFpEF, obesity, OSA, clinical OHS, deconditioning,?  Emphysema/exercise-induced asthma HFpEF management as discussed above Sleep apnea being addressed by [facility]-outside records reviewed. Labs provided by the patient also mentions a degree of anemia which needs to be further worked up, defer to PCP CT of the chest performed since last office visit in [place] reviewed.  Report mentions findings of mild emphysematous changes may benefit from pulmonary consult with a full PFT and see if he is a candidate for inhalers.  Coronary atherosclerosis due to calcified coronary lesion Atherosclerosis of aorta (HCC) Continue statin therapy. Recommended aspirin  81 mg p.o. daily. Has undergone appropriate ischemic workup as outlined above  PVC's (premature ventricular contractions) Cardiac monitor to evaluate for PVC burden  Benign hypertension Office and home blood pressures are well-controlled. Medications as discussed above. Reemphasized importance of low-salt diet.  Mixed hyperlipidemia Currently on atorvastatin  40 mg  p.o. daily.   He denies myalgia or other side effects.  Class 2 obesity due to excess calories with serious comorbidity and body mass index (BMI) of 39.0 to 39.9 in adult [facility]) Body mass index is 39.8 kg/m. We discussed pharmacological therapy for weight loss management-patient would like to hold off. May consider help in wellness programs either at [facility] or with [facility]. I reviewed with him importance of diet, regular physical activity/exercise, weight loss.   Patient is educated on the importance of increasing physical activity gradually as tolerated with a goal of moderate intensity exercise for 30 minutes a day 5 days a week.  Discussed management at least 2 chronic comorbid conditions EKG ordered and independently reviewed Outside CT chest results reviewed, provided by the patient. Outside labs from June 2025 provided by the patient independently reviewed as well as mentioned above Progress note provided by Dr. Jayson Acton from October 25, 2023 reviewed as well Results of the cardiac monitor discussed with the patient Prescription drug management Coordination of care  Orders Placed:  Orders Placed This Encounter  Procedures   EKG 12-Lead    Final Medication List:    No orders of the defined types were placed in this encounter.   Medications Discontinued During This Encounter  Medication Reason   amLODipine  (NORVASC ) 10 MG tablet Discontinued by provider      Current Outpatient Medications:    allopurinol (ZYLOPRIM) 100 MG tablet, Take 100 mg by mouth daily as needed (gout)., Disp: , Rfl:    atorvastatin  (LIPITOR) 20 MG tablet, TAKE 1 TABLET BY MOUTH EVERYDAY AT BEDTIME, Disp: 90 tablet, Rfl: 3   Cholecalciferol (VITAMIN D3) 125 MCG (5000 UT) CAPS, Take 5,000 Units by mouth every evening., Disp: , Rfl:    hydrALAZINE  (APRESOLINE ) 50 MG tablet, Take 1 tablet (50 mg total) by mouth in the morning and at bedtime., Disp: 180 tablet, Rfl: 3   isosorbide  mononitrate (IMDUR ) 60  MG 24 hr tablet, Take  1 tablet (60 mg total) by mouth daily., Disp: 90 tablet, Rfl: 3   levothyroxine  (SYNTHROID ) 150 MCG tablet, Take 1 tablet (150 mcg total) by mouth daily before breakfast., Disp: 90 tablet, Rfl: 3   metoprolol  succinate (TOPROL -XL) 25 MG 24 hr tablet, TAKE 1 TABLET (25 MG TOTAL) BY MOUTH DAILY., Disp: 90 tablet, Rfl: 2   omeprazole  (PRILOSEC ) 40 MG capsule, TAKE 1 CAPSULE BY MOUTH EVERY DAY, Disp: 90 capsule, Rfl: 3   spironolactone  (ALDACTONE ) 25 MG tablet, TAKE 1 TABLET (25 MG TOTAL) BY MOUTH IN THE MORNING, Disp: 90 tablet, Rfl: 3   tamsulosin  (FLOMAX ) 0.4 MG CAPS capsule, Take 0.4 mg by mouth at bedtime. , Disp: , Rfl:    valsartan -hydrochlorothiazide  (DIOVAN -HCT) 320-12.5 MG tablet, Take 1 tablet by mouth daily., Disp: , Rfl:    vitamin C (ASCORBIC ACID) 500 MG tablet, Take 500 mg by mouth at bedtime., Disp: , Rfl:   Consent:   NA  Disposition:   108-month follow-up sooner if needed  His questions and concerns were addressed to his satisfaction. He voices understanding of the recommendations provided during this encounter.    Signed, Madonna Michele HAS, [facility]  A Division of [facility] [address] [place], [place] [address]  [place], [place] [address] 12/31/2023 9:49 AM

## 2023-12-31 NOTE — Patient Instructions (Signed)
 Medication Instructions:  No medication changes were made at this visit. Continue current regimen.   *If you need a refill on your cardiac medications before your next appointment, please call your pharmacy*  Lab Work: None ordered today. If you have labs (blood work) drawn today and your tests are completely normal, you will receive your results only by: MyChart Message (if you have MyChart) OR A paper copy in the mail If you have any lab test that is abnormal or we need to change your treatment, we will call you to review the results.  Testing/Procedures: None ordered today.  Follow-Up: At [facility], you and your health needs are our priority.  As part of our continuing mission to provide you with exceptional heart care, our providers are all part of one team.  This team includes your primary Cardiologist (physician) and Advanced Practice Providers or APPs (Physician Assistants and Nurse Practitioners) who all work together to provide you with the care you need, when you need it.  Your next appointment:   6 month(s)  Provider:   Olinda Bertrand, DO

## 2024-06-06 ENCOUNTER — Ambulatory Visit: Admitting: Cardiology

## 2024-06-07 ENCOUNTER — Ambulatory Visit: Attending: Cardiology | Admitting: Cardiology

## 2024-06-07 ENCOUNTER — Encounter: Payer: Self-pay | Admitting: Cardiology

## 2024-06-07 VITALS — BP 158/76 | HR 74 | Resp 16 | Ht 70.0 in | Wt 281.0 lb

## 2024-06-07 DIAGNOSIS — I493 Ventricular premature depolarization: Secondary | ICD-10-CM | POA: Insufficient documentation

## 2024-06-07 DIAGNOSIS — R0609 Other forms of dyspnea: Secondary | ICD-10-CM | POA: Diagnosis present

## 2024-06-07 DIAGNOSIS — I10 Essential (primary) hypertension: Secondary | ICD-10-CM | POA: Diagnosis present

## 2024-06-07 DIAGNOSIS — I2510 Atherosclerotic heart disease of native coronary artery without angina pectoris: Secondary | ICD-10-CM | POA: Insufficient documentation

## 2024-06-07 DIAGNOSIS — I5032 Chronic diastolic (congestive) heart failure: Secondary | ICD-10-CM | POA: Diagnosis present

## 2024-06-07 DIAGNOSIS — I2584 Coronary atherosclerosis due to calcified coronary lesion: Secondary | ICD-10-CM | POA: Diagnosis present

## 2024-06-07 DIAGNOSIS — E782 Mixed hyperlipidemia: Secondary | ICD-10-CM | POA: Insufficient documentation

## 2024-06-07 DIAGNOSIS — Z6839 Body mass index (BMI) 39.0-39.9, adult: Secondary | ICD-10-CM

## 2024-06-07 DIAGNOSIS — I700 Atherosclerosis of aorta: Secondary | ICD-10-CM | POA: Insufficient documentation

## 2024-06-07 MED ORDER — METOPROLOL SUCCINATE ER 25 MG PO TB24: 25.0000 mg | ORAL_TABLET | Freq: Every day | ORAL | 3 refills | Status: AC

## 2024-06-07 NOTE — Patient Instructions (Signed)
 Medication Instructions:  No Changes *If you need a refill on your cardiac medications before your next appointment, please call your pharmacy*  Lab Work: None  Follow-Up: At [facility], you and your health needs are our priority.  As part of our continuing mission to provide you with exceptional heart care, our providers are all part of one team.  This team includes your primary Cardiologist (physician) and Advanced Practice Providers or APPs (Physician Assistants and Nurse Practitioners) who all work together to provide you with the care you need, when you need it.  Your next appointment:   1 year(s)  Provider:   Madonna Large, DO    Other Instructions Please call us  or send a MyChart message with any Cardiology related questions/concerns.  [phone].  Thank you!

## 2024-06-07 NOTE — Progress Notes (Unsigned)
 Cardiology Office Note:  .   ID:  [name], DOB 06-21-1941, MRN [id number] PCP:  Joella Sieving, DO  Former Cardiology Providers: NA [facility] Providers Cardiologist:  Madonna Large, DO , [facility] (established care 12/05/2021) Electrophysiologist:  None  Click to update primary MD,subspecialty MD or APP then REFRESH:1}    Chief Complaint  Patient presents with   Follow-up    HFpEF    History of Present Illness: .   [name] is a 83 y.o. Caucasian male whose past medical history and cardiovascular risk factors includes: Chronic HFpEF, hypertension, hyperlipidemia, sleep apnea now on BiPAP (follows with [facility]) history of COVID-19 infection, BPH, obesity due to excess calories, hypothyroidism, advanced age.  Patient being followed practice for HFpEF management.  With uptitration of medical therapy patient has HFpEF symptoms have significantly improved.  In addition, he was noted to have frequent PVCs on prior ECG.  He underwent a cardiac monitor which noted an average heart rate of 60 bpm, a total supraventricular ectopic burden of 10.3%.  He was started on low-dose Toprol -XL and since then he has felt better with more energy.  Follows with [facility] and his CPAP was transition to BiPAP.  At the last office visit had recommended that he follows up with PCP for workup of anemia and also consider pulmonary consult as prior CT scans had reported emphysematous changes.   Patient presents today for 62-month follow-up visit.  Since last office visit Mr. Benjamin FORBES Gorczyca denies any anginal chest pain or heart failure symptoms.   No hospitalizations or urgent care visits for cardiovascular reasons.   He has been compliant with his medical therapy.   Physical endurance remains stable. Home SBP are better controlled.  Review of Systems: .   Review of Systems  Cardiovascular:  Negative for chest pain, claudication, irregular heartbeat, leg swelling, near-syncope, orthopnea,  palpitations, paroxysmal nocturnal dyspnea and syncope.  Respiratory:  Negative for shortness of breath.   Hematologic/Lymphatic: Negative for bleeding problem.    Studies Reviewed:    Echocardiogram: 09/25/2015: Technically difficult study, LVEF 60 to 65%, grade 1 diastolic impairment, mild MR, moderate LAE, liver cyst 4 x 5.5 cm.   12/18/2021: Left ventricle cavity is normal in size. Mild concentric hypertrophy of the left ventricle. Normal global wall motion. Normal LV systolic function with EF 62%. Doppler evidence of grade I (impaired) diastolic dysfunction, normal LAP.  The aortic root is borderline dilated at 3.9 cm. Left atrial cavity is mildly dilated. Aneurysmal interatrial septum without 2D or color Doppler evidence of shunting. Mild to moderate mitral regurgitation. Mild tricuspid regurgitation. Mild pulmonic regurgitation. IVC is not seen.  Incidental finding of large echolucent structure in lives, possible cyst. Recommend clinical correlation.  Stress Testing: Lexiscan  stress test September 2023: Low restudy  Coronary CTA. October 11, 2022 1. Total coronary calcium  score of 74.7. This was 18th percentile for age and sex matched control.  2. Normal coronary origin with right dominance.  3. CAD-RADS = 1.  Left Main: Patent with minimal calcified plaque within the distal segment.  LAD: Minimal calcified plaque within the proximal and mid LAD without stenosis, otherwise vessel is patent.  LCx: Patent.  RCA: Patent.  4. Scattered aortic atherosclerosis.  5. Quality of scan is limited due to signal to noise and mis-registration artifact.  6. Scattered aortic atherosclerosis.  Noncardiac findings:  Aortic Atherosclerosis (ICD10-I70.0). Hypodensities within the liver likely representing cysts. Ultrasound could be helpful for further evaluation.  RECOMMENDATIONS: Consider  non-atherosclerotic causes of chest pain. Consider preventive therapy and risk factor  modification.   Cardiac monitor (Zio Patch): July 12, 2023-July 21, 2023 Dominant rhythm sinus, Bradycardia burden (27%) Heart rate 35-150 bpm. Avg HR 60 bpm. No atrial fibrillation detected during the monitoring period. No high grade AV block, pauses (3 seconds or longer). Total supraventricular ectopic burden Isolated SVEs were frequent (6.3%), SVE Couplets were occasional (4.0%), and SVE Triplets were rare (<1.0%). Rare asymptomatic episodes of PSVT/atrial tachycardia. The fastest episode was 6 beats, 3.1 seconds, average HR 135 bpm, max HR 150 bpm Total ventricular ectopic burden <1%. Patient triggered events: 0.   RADIOLOGY: N/A  Risk Assessment/Calculations:   NA   Labs:       Latest Ref Rng & Units 07/10/2015   11:00 AM 05/05/2014    4:55 AM 04/27/2014    9:35 AM  CBC  WBC 4.0 - 10.5 K/uL 8.5  14.2  6.8   Hemoglobin 13.0 - 17.0 g/dL 85.6  88.4  85.9   Hematocrit 39.0 - 52.0 % 42.1  34.8  41.6   Platelets 150 - 400 K/uL 181  193  206        Latest Ref Rng & Units 07/05/2023   11:47 AM 10/01/2022   10:17 AM 04/15/2022    9:31 AM  BMP  Glucose 70 - 99 mg/dL 93  95  899   BUN 8 - 27 mg/dL 20  23  19    Creatinine 0.76 - 1.27 mg/dL 8.80  8.60  8.66   BUN/Creat Ratio 10 - 24 17  17  14    Sodium 134 - 144 mmol/L 143  143  144   Potassium 3.5 - 5.2 mmol/L 4.4  4.2  4.2   Chloride 96 - 106 mmol/L 106  103  104   CO2 20 - 29 mmol/L 21  24  27    Calcium  8.6 - 10.2 mg/dL 9.6  9.8  9.8       Latest Ref Rng & Units 07/05/2023   11:47 AM 10/01/2022   10:17 AM 04/15/2022    9:31 AM  CMP  Glucose 70 - 99 mg/dL 93  95  899   BUN 8 - 27 mg/dL 20  23  19    Creatinine 0.76 - 1.27 mg/dL 8.80  8.60  8.66   Sodium 134 - 144 mmol/L 143  143  144   Potassium 3.5 - 5.2 mmol/L 4.4  4.2  4.2   Chloride 96 - 106 mmol/L 106  103  104   CO2 20 - 29 mmol/L 21  24  27    Calcium  8.6 - 10.2 mg/dL 9.6  9.8  9.8     Lab Results  Component Value Date   CHOL 139 10/01/2022   HDL  31 (L) 10/01/2022   LDLCALC 87 10/01/2022   LDLDIRECT 87 10/01/2022   TRIG 113 10/01/2022   CHOLHDL 4.1 11/27/2020   No results for input(s): LIPOA in the last 8760 hours. No components found for: NTPROBNP No results for input(s): PROBNP in the last 8760 hours.  No results for input(s): TSH in the last 8760 hours.  Date Collected: 11/11/2021 , information obtained by referring physician BNP 126 Potassium: 4.0 Creatinine 1.15 mg/dL. eGFR: 64 mL/min per 1.73 m Hemoglobin: 13.8 g/dL and hematocrit: 59.6 % Lipid profile: Total cholesterol 168 , triglycerides 190 , HDL 36 , LDL 101 non-HDL 132 AST: 18 , ALT: 19 , alkaline phosphatase: 72  TSH: 3.93    External  Labs: Collected: December 10, 2023 provided by the patient performed at PCPs office. Hemoglobin A1c 6.3. Hemoglobin 12.1, hematocrit 37.2%. BUN 21, creatinine 1.57. EGFR 44. Sodium 144, potassium 4.5, chloride 107, bicarb 26. AST, ALT, alkaline phosphatase within normal limits. TSH 3.42. Total cholesterol 112, triglycerides 105, HDL 31, LDL 62, non-HDL 81.  Physical Exam:    Today's Vitals   06/07/24 1313  BP: (!) 158/76  Pulse: 74  Resp: 16  SpO2: 95%  Weight: 281 lb (127.5 kg)  Height: 5' 10 (1.778 m)     Body mass index is 40.32 kg/m. Wt Readings from Last 3 Encounters:  06/07/24 281 lb (127.5 kg)  12/31/23 277 lb 6.4 oz (125.8 kg)  07/09/23 280 lb 12.8 oz (127.4 kg)    Physical Exam  Constitutional: No distress.  Age appropriate, hemodynamically stable.   Neck: No JVD present.  Cardiovascular: Normal rate, regular rhythm, S1 normal, S2 normal, intact distal pulses and normal pulses. Exam reveals no gallop, no S3 and no S4.  No murmur heard. Pulses:      Dorsalis pedis pulses are 2+ on the right side and 2+ on the left side.       Posterior tibial pulses are 2+ on the right side and 2+ on the left side.  Pulmonary/Chest: Effort normal and breath sounds normal. No stridor. He has no wheezes. He  has no rales.  Abdominal: Soft. Bowel sounds are normal. He exhibits no distension. There is no abdominal tenderness.  Musculoskeletal:        General: No edema.     Cervical back: Neck supple.  Neurological: He is alert and oriented to person, place, and time. He has intact cranial nerves (2-12).  Skin: Skin is warm and moist.     Impression & Recommendation(s):  Impression:   ICD-10-CM   1. Chronic heart failure with preserved ejection fraction (HFpEF) (HCC)  I50.32 metoprolol  succinate (TOPROL -XL) 25 MG 24 hr tablet    2. Dyspnea on exertion  R06.09     3. Coronary atherosclerosis due to calcified coronary lesion  I25.10    I25.84     4. Atherosclerosis of aorta  I70.0     5. PVC's (premature ventricular contractions)  I49.3     6. Benign hypertension  I10     7. Mixed hyperlipidemia  E78.2      Recommendation(s):  Chronic heart failure with preserved ejection fraction (HFpEF) (HCC) Stage B, NYHA class II Echo 12/2021: Preserved LV, grade 1 diastolic dysfunction, mild to moderate valvular heart disease Total CAC 74.7, 18th percentile Stress MPI: Low risk Entresto  was cost prohibitive in the past Continue Diovan /HCTZ. Continue spironolactone . Continue hydralazine  100 mg p.o. twice daily  Continue Imdur  60 mg p.o. daily, isosorbide  dinitrate was cost prohibitive  Amlodipine  discontinued due to lower extremity swelling. Was transitioned from CPAP to BiPAP and endorses better sleep hygiene-follows with [facility] Start Toprol -XL 25 mg p.o. daily, medication profile discussed.  Dyspnea on exertion Multifactorial: HFpEF, obesity, OSA, clinical OHS, deconditioning,?  Emphysema/exercise-induced asthma HFpEF management as discussed above Sleep apnea-follows [facility].  CT of the chest performed in [place] (in past) reported mild emphysematous changes.  In the past I recommended that he follows up with either PCP or consider pulmonary evaluation.  Coronary  atherosclerosis due to calcified coronary lesion Atherosclerosis of aorta (HCC) Continue statin therapy. Recommended aspirin  81 mg p.o. daily. Has undergone appropriate ischemic workup as outlined above No additional testing warranted at this time in an asymptomatic  male  PSVT / PVC's (premature ventricular contractions) For reasons unknown metoprolol  succinate is no longer on his medication list. Shared decisionstart Toprol -XL 25 mg p.o. daily  Benign hypertension Office blood pressures are not well-controlled. Home blood pressures are better controlled. Medications reviewed and mentioned above for reference  Mixed hyperlipidemia Currently on atorvastatin  20 mg p.o. daily.   He denies myalgia or other side effects.  Orders Placed:  No orders of the defined types were placed in this encounter.   Final Medication List:    Meds ordered this encounter  Medications   metoprolol  succinate (TOPROL -XL) 25 MG 24 hr tablet    Sig: Take 1 tablet (25 mg total) by mouth daily.    Dispense:  90 tablet    Refill:  3    Medications Discontinued During This Encounter  Medication Reason   metoprolol  succinate (TOPROL -XL) 25 MG 24 hr tablet Reorder       Current Outpatient Medications:    allopurinol (ZYLOPRIM) 100 MG tablet, Take 100 mg by mouth daily as needed (gout)., Disp: , Rfl:    atorvastatin  (LIPITOR) 20 MG tablet, TAKE 1 TABLET BY MOUTH EVERYDAY AT BEDTIME, Disp: 90 tablet, Rfl: 3   Cholecalciferol (VITAMIN D3) 125 MCG (5000 UT) CAPS, Take 5,000 Units by mouth every evening., Disp: , Rfl:    hydrALAZINE  (APRESOLINE ) 50 MG tablet, Take 1 tablet (50 mg total) by mouth in the morning and at bedtime., Disp: 180 tablet, Rfl: 3   isosorbide  mononitrate (IMDUR ) 60 MG 24 hr tablet, Take 1 tablet (60 mg total) by mouth daily., Disp: 90 tablet, Rfl: 3   levothyroxine  (SYNTHROID ) 150 MCG tablet, Take 1 tablet (150 mcg total) by mouth daily before breakfast., Disp: 90 tablet, Rfl: 3    omeprazole  (PRILOSEC ) 40 MG capsule, TAKE 1 CAPSULE BY MOUTH EVERY DAY, Disp: 90 capsule, Rfl: 3   spironolactone  (ALDACTONE ) 25 MG tablet, TAKE 1 TABLET (25 MG TOTAL) BY MOUTH IN THE MORNING, Disp: 90 tablet, Rfl: 3   tamsulosin  (FLOMAX ) 0.4 MG CAPS capsule, Take 0.4 mg by mouth at bedtime. , Disp: , Rfl:    valsartan -hydrochlorothiazide  (DIOVAN -HCT) 320-12.5 MG tablet, Take 1 tablet by mouth daily., Disp: , Rfl:    vitamin C (ASCORBIC ACID) 500 MG tablet, Take 500 mg by mouth at bedtime., Disp: , Rfl:    metoprolol  succinate (TOPROL -XL) 25 MG 24 hr tablet, Take 1 tablet (25 mg total) by mouth daily., Disp: 90 tablet, Rfl: 3  Consent:   NA  Disposition:   34-month follow-up sooner if needed  Discussed management of at least 2 or more stable chronic illnesses, reviewed prior results of the echo and cardiovascular testing, medication refilled, patient education, coordination of care.  His questions and concerns were addressed to his satisfaction. He voices understanding of the recommendations provided during this encounter.    Signed, Madonna Michele HAS, [facility]  A Division of [facility] [address], [place], [place] [address]  [place], [place] [address] 06/11/2024 4:19 PM

## 2024-06-11 ENCOUNTER — Encounter: Payer: Self-pay | Admitting: Cardiology

## 2024-06-17 ENCOUNTER — Other Ambulatory Visit: Payer: Self-pay | Admitting: Cardiology

## 2024-06-17 DIAGNOSIS — I10 Essential (primary) hypertension: Secondary | ICD-10-CM

## 2024-06-17 DIAGNOSIS — E782 Mixed hyperlipidemia: Secondary | ICD-10-CM

## 2024-08-10 ENCOUNTER — Other Ambulatory Visit: Payer: Self-pay

## 2024-08-10 DIAGNOSIS — I5032 Chronic diastolic (congestive) heart failure: Secondary | ICD-10-CM
